# Patient Record
Sex: Female | Born: 1960 | Race: Black or African American | Hispanic: No | Marital: Single | Smoking: Current every day smoker
Health system: Southern US, Community
[De-identification: ages and names within clinical notes are randomized; demographics above are authoritative.]

## PROBLEM LIST (undated history)

## (undated) DIAGNOSIS — E785 Hyperlipidemia, unspecified: Secondary | ICD-10-CM

## (undated) DIAGNOSIS — M549 Dorsalgia, unspecified: Secondary | ICD-10-CM

## (undated) DIAGNOSIS — R87629 Unspecified abnormal cytological findings in specimens from vagina: Secondary | ICD-10-CM

## (undated) DIAGNOSIS — E119 Type 2 diabetes mellitus without complications: Secondary | ICD-10-CM

## (undated) DIAGNOSIS — K8020 Calculus of gallbladder without cholecystitis without obstruction: Secondary | ICD-10-CM

## (undated) DIAGNOSIS — R51 Headache: Secondary | ICD-10-CM

## (undated) DIAGNOSIS — T7840XA Allergy, unspecified, initial encounter: Secondary | ICD-10-CM

## (undated) HISTORY — DX: Type 2 diabetes mellitus without complications: E11.9

## (undated) HISTORY — DX: Hyperlipidemia, unspecified: E78.5

## (undated) HISTORY — PX: NO PAST SURGERIES: SHX2092

## (undated) HISTORY — DX: Unspecified abnormal cytological findings in specimens from vagina: R87.629

---

## 1998-02-26 ENCOUNTER — Other Ambulatory Visit: Admission: RE | Admit: 1998-02-26 | Discharge: 1998-02-26 | Payer: Self-pay | Admitting: Obstetrics and Gynecology

## 1999-05-13 ENCOUNTER — Other Ambulatory Visit: Admission: RE | Admit: 1999-05-13 | Discharge: 1999-05-13 | Payer: Self-pay | Admitting: Obstetrics and Gynecology

## 2000-11-13 ENCOUNTER — Other Ambulatory Visit: Admission: RE | Admit: 2000-11-13 | Discharge: 2000-11-13 | Payer: Self-pay | Admitting: Obstetrics and Gynecology

## 2001-12-29 ENCOUNTER — Emergency Department (HOSPITAL_COMMUNITY): Admission: EM | Admit: 2001-12-29 | Discharge: 2001-12-29 | Payer: Self-pay | Admitting: Emergency Medicine

## 2002-05-28 ENCOUNTER — Emergency Department (HOSPITAL_COMMUNITY): Admission: EM | Admit: 2002-05-28 | Discharge: 2002-05-28 | Payer: Self-pay | Admitting: Emergency Medicine

## 2008-10-25 ENCOUNTER — Emergency Department (HOSPITAL_COMMUNITY): Admission: EM | Admit: 2008-10-25 | Discharge: 2008-10-26 | Payer: Self-pay | Admitting: Emergency Medicine

## 2009-10-23 ENCOUNTER — Emergency Department (HOSPITAL_COMMUNITY): Admission: EM | Admit: 2009-10-23 | Discharge: 2009-10-23 | Payer: Self-pay | Admitting: Emergency Medicine

## 2009-12-21 ENCOUNTER — Emergency Department (HOSPITAL_COMMUNITY): Admission: EM | Admit: 2009-12-21 | Discharge: 2009-12-21 | Payer: Self-pay | Admitting: Emergency Medicine

## 2010-06-09 ENCOUNTER — Emergency Department (HOSPITAL_COMMUNITY): Admission: EM | Admit: 2010-06-09 | Discharge: 2010-06-09 | Payer: Self-pay | Admitting: Emergency Medicine

## 2011-01-10 LAB — URINALYSIS, ROUTINE W REFLEX MICROSCOPIC
Glucose, UA: NEGATIVE mg/dL
Nitrite: NEGATIVE
Specific Gravity, Urine: 1.03 (ref 1.005–1.030)
pH: 5.5 (ref 5.0–8.0)

## 2011-01-10 LAB — URINE MICROSCOPIC-ADD ON

## 2011-01-10 LAB — POCT PREGNANCY, URINE: Preg Test, Ur: NEGATIVE

## 2011-01-10 LAB — WET PREP, GENITAL: Clue Cells Wet Prep HPF POC: NONE SEEN

## 2011-01-27 LAB — URINE CULTURE: Colony Count: 8000

## 2011-01-27 LAB — GC/CHLAMYDIA PROBE AMP, GENITAL: Chlamydia, DNA Probe: NEGATIVE

## 2011-01-27 LAB — POCT URINALYSIS DIP (DEVICE)
Glucose, UA: NEGATIVE mg/dL
Ketones, ur: NEGATIVE mg/dL
Protein, ur: NEGATIVE mg/dL
Specific Gravity, Urine: 1.025 (ref 1.005–1.030)
Urobilinogen, UA: 0.2 mg/dL (ref 0.0–1.0)

## 2011-01-27 LAB — WET PREP, GENITAL: Yeast Wet Prep HPF POC: NONE SEEN

## 2011-07-31 LAB — WET PREP, GENITAL: Yeast Wet Prep HPF POC: NONE SEEN

## 2011-07-31 LAB — URINE MICROSCOPIC-ADD ON

## 2011-07-31 LAB — URINALYSIS, ROUTINE W REFLEX MICROSCOPIC
Glucose, UA: NEGATIVE mg/dL
Ketones, ur: NEGATIVE mg/dL
Nitrite: NEGATIVE
Specific Gravity, Urine: 1.015 (ref 1.005–1.030)
pH: 6 (ref 5.0–8.0)

## 2013-02-11 ENCOUNTER — Observation Stay (HOSPITAL_COMMUNITY): Payer: Self-pay

## 2013-02-11 ENCOUNTER — Encounter (HOSPITAL_COMMUNITY): Payer: Self-pay

## 2013-02-11 ENCOUNTER — Inpatient Hospital Stay (HOSPITAL_COMMUNITY)
Admission: EM | Admit: 2013-02-11 | Discharge: 2013-02-13 | DRG: 690 | Disposition: A | Discharge status: Home / Self Care | Payer: MEDICAID | Attending: Internal Medicine | Admitting: Internal Medicine

## 2013-02-11 DIAGNOSIS — R509 Fever, unspecified: Secondary | ICD-10-CM | POA: Diagnosis present

## 2013-02-11 DIAGNOSIS — R51 Headache: Secondary | ICD-10-CM

## 2013-02-11 DIAGNOSIS — N12 Tubulo-interstitial nephritis, not specified as acute or chronic: Principal | ICD-10-CM

## 2013-02-11 DIAGNOSIS — R519 Headache, unspecified: Secondary | ICD-10-CM | POA: Diagnosis present

## 2013-02-11 DIAGNOSIS — E876 Hypokalemia: Secondary | ICD-10-CM

## 2013-02-11 DIAGNOSIS — D72829 Elevated white blood cell count, unspecified: Secondary | ICD-10-CM | POA: Diagnosis present

## 2013-02-11 DIAGNOSIS — T7840XA Allergy, unspecified, initial encounter: Secondary | ICD-10-CM | POA: Insufficient documentation

## 2013-02-11 HISTORY — DX: Allergy, unspecified, initial encounter: T78.40XA

## 2013-02-11 HISTORY — DX: Headache: R51

## 2013-02-11 LAB — URINALYSIS, ROUTINE W REFLEX MICROSCOPIC
Bilirubin Urine: NEGATIVE
Glucose, UA: NEGATIVE mg/dL
Protein, ur: 100 mg/dL — AB
Specific Gravity, Urine: 1.025 (ref 1.005–1.030)
Urobilinogen, UA: 1 mg/dL (ref 0.0–1.0)
pH: 5.5 (ref 5.0–8.0)

## 2013-02-11 LAB — CREATININE, SERUM: GFR calc Af Amer: >90 mL/min (ref >90)

## 2013-02-11 LAB — URINE MICROSCOPIC-ADD ON

## 2013-02-11 LAB — COMPREHENSIVE METABOLIC PANEL
Albumin: 3.6 g/dL (ref 3.5–5.2)
Alkaline Phosphatase: 66 U/L (ref 39–117)
BUN: 8 mg/dL (ref 6–23)
Calcium: 9.6 mg/dL (ref 8.4–10.5)
Creatinine, Ser: 0.68 mg/dL (ref 0.50–1.10)
GFR calc Af Amer: >90 mL/min (ref >90)
Glucose, Bld: 93 mg/dL (ref 70–99)
Potassium: 3.8 mEq/L (ref 3.5–5.1)
Total Protein: 7.9 g/dL (ref 6.0–8.3)

## 2013-02-11 LAB — CBC WITH DIFFERENTIAL/PLATELET
Eosinophils Absolute: 0 10*3/uL (ref 0.0–0.7)
Eosinophils Relative: 0 % (ref 0–5)
Lymphs Abs: 2.4 10*3/uL (ref 0.7–4.0)
MCH: 29.9 pg (ref 26.0–34.0)
MCV: 85.8 fL (ref 78.0–100.0)
Monocytes Absolute: 1.2 10*3/uL — ABNORMAL HIGH (ref 0.1–1.0)
Platelets: 282 10*3/uL (ref 150–400)
RBC: 4.15 MIL/uL (ref 3.87–5.11)

## 2013-02-11 LAB — CBC
HCT: 32 % — ABNORMAL LOW (ref 36.0–46.0)
MCH: 29.5 pg (ref 26.0–34.0)
MCV: 86.5 fL (ref 78.0–100.0)
RBC: 3.7 MIL/uL — ABNORMAL LOW (ref 3.87–5.11)
WBC: 11.6 10*3/uL — ABNORMAL HIGH (ref 4.0–10.5)

## 2013-02-11 LAB — WET PREP, GENITAL
Clue Cells Wet Prep HPF POC: NONE SEEN
Trich, Wet Prep: NONE SEEN
Yeast Wet Prep HPF POC: NONE SEEN

## 2013-02-11 LAB — LACTIC ACID, PLASMA: Lactic Acid, Venous: 0.8 mmol/L (ref 0.5–2.2)

## 2013-02-11 MED ORDER — AZITHROMYCIN 250 MG PO TABS
1000.0000 mg | ORAL_TABLET | Freq: Once | ORAL | Status: AC
  Administered 2013-02-11: 1000 mg via ORAL
  Filled 2013-02-11: qty 4

## 2013-02-11 MED ORDER — SODIUM CHLORIDE 0.9 % IV SOLN
INTRAVENOUS | Status: AC
  Administered 2013-02-11 (×2): via INTRAVENOUS

## 2013-02-11 MED ORDER — DEXTROSE 5 % IV SOLN
1.0000 g | Freq: Once | INTRAVENOUS | Status: AC
  Administered 2013-02-11: 1 g via INTRAVENOUS
  Filled 2013-02-11: qty 10

## 2013-02-11 MED ORDER — DEXTROSE 5 % IV SOLN
1.0000 g | INTRAVENOUS | Status: DC
  Administered 2013-02-12: 1 g via INTRAVENOUS
  Filled 2013-02-11 (×2): qty 10

## 2013-02-11 MED ORDER — ACETAMINOPHEN 325 MG PO TABS
650.0000 mg | ORAL_TABLET | Freq: Four times a day (QID) | ORAL | Status: DC | PRN
  Administered 2013-02-11 – 2013-02-12 (×4): 650 mg via ORAL
  Filled 2013-02-11 (×4): qty 2

## 2013-02-11 MED ORDER — ONDANSETRON HCL 4 MG/2ML IJ SOLN
4.0000 mg | Freq: Three times a day (TID) | INTRAMUSCULAR | Status: AC | PRN
  Administered 2013-02-11: 4 mg via INTRAVENOUS
  Filled 2013-02-11: qty 2

## 2013-02-11 MED ORDER — HEPARIN SODIUM (PORCINE) 5000 UNIT/ML IJ SOLN
5000.0000 [IU] | Freq: Three times a day (TID) | INTRAMUSCULAR | Status: DC
  Administered 2013-02-11 – 2013-02-12 (×2): 5000 [IU] via SUBCUTANEOUS
  Filled 2013-02-11 (×5): qty 1

## 2013-02-11 MED ORDER — SODIUM CHLORIDE 0.9 % IV BOLUS (SEPSIS)
1000.0000 mL | Freq: Once | INTRAVENOUS | Status: AC
  Administered 2013-02-11: 1000 mL via INTRAVENOUS

## 2013-02-11 MED ORDER — ONDANSETRON HCL 4 MG/2ML IJ SOLN
4.0000 mg | Freq: Once | INTRAMUSCULAR | Status: AC
  Administered 2013-02-11: 4 mg via INTRAVENOUS
  Filled 2013-02-11: qty 2

## 2013-02-11 MED ORDER — MORPHINE SULFATE 4 MG/ML IJ SOLN
4.0000 mg | Freq: Once | INTRAMUSCULAR | Status: AC
  Administered 2013-02-11: 4 mg via INTRAVENOUS
  Filled 2013-02-11: qty 1

## 2013-02-11 MED ORDER — HYDROMORPHONE HCL PF 1 MG/ML IJ SOLN
1.0000 mg | INTRAMUSCULAR | Status: AC | PRN
  Administered 2013-02-11: 1 mg via INTRAVENOUS
  Filled 2013-02-11: qty 1

## 2013-02-11 MED ORDER — DEXTROSE 5 % IV SOLN
1.0000 g | INTRAVENOUS | Status: DC
  Filled 2013-02-11: qty 10

## 2013-02-11 NOTE — Progress Notes (Signed)
Notified Callahan NP that patient's temp is 102.6 HR 99.  Patient has order to notify MD for possible sepsis if temp is greater than 100.9, and HR greater than 90.  Claiborne Billings NP stated to give tynenol as ordered, no other orders given. Will continue to monitor patient. Nelda Marseille, RN

## 2013-02-11 NOTE — ED Provider Notes (Signed)
History     CSN: [id number]  Arrival date & time 02/11/13  1243   First MD Initiated Contact with Patient 02/11/13 1302      Chief Complaint  Patient presents with  . Urinary Urgency  . Fever    (Consider location/radiation/quality/duration/timing/severity/associated sxs/prior treatment) HPI  52 year old female presents complaining of fever and dysuria. Patient reports a week ago she developed some vaginal itching sensation. Sensation was ongoing for several days not improve from using over-the-counter yeast treatment medication. For the past several days she also reports having burning urination and increased urinary frequency. She also reports feeling myalgias, have a Reiter's, having decreased appetite. She dorsiflexes fever. She reports having low abdominal pain that radiates to her low back and mid back, achy, persistent. She also reports having a persistent throbbing headache to her forehead. She denies cough, chest pain, shortness of breath, nausea, vomiting, diarrhea. She denies hematuria, hematochezia, or melena. Her last sexual activities was 10 days ago.  History reviewed. No pertinent past medical history.  History reviewed. No pertinent past surgical history.  No family history on file.  History  Substance Use Topics  . Smoking status: Current Every Day Smoker -- 1.00 packs/day  . Smokeless tobacco: Not on file  . Alcohol Use: No    OB History   Grav Para Term Preterm Abortions TAB SAB Ect Mult Living                  Review of Systems  Constitutional:       10 Systems reviewed and all are negative for acute change except as noted in the HPI.     Allergies  Review of patient's allergies indicates no known allergies.  Home Medications  No current outpatient prescriptions on file.  BP 120/73  Pulse 111  Temp(Src) 103.1 F (39.5 C) (Oral)  Resp 18  SpO2 96%  Physical Exam  Nursing note and vitals reviewed. Constitutional: She appears  well-developed and well-nourished. No distress.  Awake, alert, nontoxic appearance  HENT:  Head: Atraumatic.  Mouth/Throat: Oropharynx is clear and moist.  Eyes: Conjunctivae are normal. Right eye exhibits no discharge. Left eye exhibits no discharge.  Neck: Neck supple.  Cardiovascular: Regular rhythm.   Tachycardia noted without murmurs, rubs, or gallops  Pulmonary/Chest: Effort normal. No respiratory distress. She exhibits no tenderness.  Abdominal: Soft. There is tenderness (mild suprapubic tenderness on the patient without guarding or rebound tenderness. No hernia noted.). There is no rebound. Hernia confirmed negative in the right inguinal area and confirmed negative in the left inguinal area.  Genitourinary: Uterus normal. There is no rash, tenderness, lesion or injury on the right labia. There is no rash, tenderness, lesion or injury on the left labia. Cervix exhibits discharge. Cervix exhibits no motion tenderness and no friability. Right adnexum displays no tenderness and no fullness. Left adnexum displays no tenderness and no fullness. No tenderness or bleeding around the vagina. Vaginal discharge found.  No CVA tenderness.  Chaperone present  Moderate kurd-like discharge in vaginal vault from cervical os.    Musculoskeletal: She exhibits no tenderness.  ROM appears intact, no obvious focal weakness  Neurological:  Mental status and motor strength appears intact  Skin: No rash noted.  Psychiatric: She has a normal mood and affect.    ED Course  Procedures (including critical care time)  1:23 PM Patient presents with a fever of 103.1, tachycardic but not hypotensive. She does not appear toxic. She has dysuria. The symptom is suggestive of  pyelonephritis. Workup initiated.  2:42 PM UA result is indicative of UTI.  In the setting of fever, tachycardia, and leukocytosis will treat pt for urosepsis.  Pt also has vaginal discharge on pelvic exam.  Will give rocephin and  zithromax.  Urine culture, blood culture, and GC/Ch culture sent.  IVF given, tylenol for fever.  Will admit for further care.  Care discussed with attending.  Pt likely benefit from IV abx and transition to PO once tolerated.    3:09 PM I have consulted with [facility] hospitalist, Dr. Mahala Menghini who agrees to admit pt to med surg, team 6, under his care.     Labs Reviewed  WET PREP, GENITAL - Abnormal; Notable for the following:    WBC, Wet Prep HPF POC TOO NUMEROUS TO COUNT (*)    All other components within normal limits  COMPREHENSIVE METABOLIC PANEL - Abnormal; Notable for the following:    Total Bilirubin 0.2 (*)    All other components within normal limits  URINALYSIS, ROUTINE W REFLEX MICROSCOPIC - Abnormal; Notable for the following:    APPearance CLOUDY (*)    Hgb urine dipstick LARGE (*)    Ketones, ur 15 (*)    Protein, ur 100 (*)    Leukocytes, UA MODERATE (*)    All other components within normal limits  CBC WITH DIFFERENTIAL - Abnormal; Notable for the following:    WBC 12.7 (*)    HCT 35.6 (*)    Neutro Abs 9.0 (*)    Monocytes Absolute 1.2 (*)    All other components within normal limits  URINE MICROSCOPIC-ADD ON - Abnormal; Notable for the following:    Squamous Epithelial / LPF FEW (*)    Bacteria, UA FEW (*)    All other components within normal limits  URINE CULTURE  GC/CHLAMYDIA PROBE AMP  CULTURE, BLOOD (ROUTINE X 2)  CULTURE, BLOOD (ROUTINE X 2)  LACTIC ACID, PLASMA   No results found.   1. Pyelonephritis       MDM  BP 100/65  Pulse 99  Temp(Src) 101.1 F (38.4 C) (Oral)  Resp 17  SpO2 98%  I have reviewed nursing notes and vital signs. I personally reviewed the imaging tests through PACS system  I reviewed available ER/hospitalization records thought the EMR         Fayrene Helper, [place] 02/11/13 1511

## 2013-02-11 NOTE — ED Notes (Signed)
Pt taken to xray 

## 2013-02-11 NOTE — ED Provider Notes (Signed)
Medical screening examination/treatment/procedure(s) were performed by non-physician practitioner and as supervising physician I was immediately available for consultation/collaboration.   Nelia Shi, MD 02/11/13 [id number]

## 2013-02-11 NOTE — Progress Notes (Signed)
[facility] History and Physical  Safiyah Cisney WUJ:[id number] DOB: 1961/06/12 DOA: 02/11/2013  Referring physician: Daine Gravel PA PCP: Default, Provider, MD  Specialists:  none  Chief Complaint: Abd apin  HPI: Mallory Wall is a 52 y.o. female  Who presente dot [facility] ED 4.18 after about 6 days h/o chills, mylagias.  Stayed out of work for 1-2 days.  But then developed chills again.  LAst night went to work at [employer]-decided to come in after feeling more myalgias and aches.  States she has some a mild cough-no sputum.  Her sister has been sick with bronchitis/flu like symptoms and she has had some sick contacts at her work-place as well.  She states she devleoped some mild dysuria priro to feeling ill.  She is sexually active but has not noticed any vaginal discharge or odour.  She has not had diarrhea or bloody stools  Review of Systems: The patient denies cp/sob/visual issues, falls weakness.  Endorses decreased appetite.  No tarry or dark stools, stroke like symptoms.  History reviewed. No pertinent past medical history. History reviewed. No pertinent past surgical history. Social History:  reports that she has been smoking.  She does not have any smokeless tobacco history on file. She reports that she does not drink alcohol. Her drug history is not on file.  No Known Allergies  Family History  Problem Relation Age of Onset  . CAD Mother     Prior to Admission medications   Medication Sig Start Date End Date Taking? Authorizing Provider  Aspirin-Acetaminophen-Caffeine (GOODY HEADACHE PO) Take 1 packet by mouth.   Yes Historical Provider, MD  diphenhydramine-acetaminophen (TYLENOL PM) 25-500 MG TABS Take 2 tablets by mouth at bedtime as needed (for sleep).   Yes Historical Provider, MD  Naproxen Sodium (ALEVE PO) Take 1 tablet by mouth daily as needed (for pain.).   Yes Historical Provider, MD  Pseudoeph-Doxylamine-DM-APAP (NYQUIL PO) Take 30 mLs by mouth daily  as needed (for cold symptoms).   Yes Historical Provider, MD   Physical Exam: Filed Vitals:   02/11/13 1248 02/11/13 1432  BP: 120/73 100/65  Pulse: 111 99  Temp: 103.1 F (39.5 C) 101.1 F (38.4 C)  TempSrc: Oral Oral  Resp: 18 17  SpO2: 96% 98%     General:  Alert pleasant oriented  Eyes: Movements intact nontraumaticn  ENT: Moderate dentition   Neck: Soft supple no thyromegaly   Cardiovascular: S1-S2 no rub or gallop slight tachycardiac   Respiratory: Clear   Abdomen: Soft nontender nondistended however some CVA tenderness notably on the left side   Skin: No lower extremity edema  Musculoskeletal: Range motion intact   Psychiatric: Euthymic   Neurologic: Grossly intact  Labs on Admission:  Basic Metabolic Panel:  Recent Labs Lab 02/11/13 1345  NA 136  K 3.8  CL 100  CO2 28  GLUCOSE 93  BUN 8  CREATININE 0.68  CALCIUM 9.6   Liver Function Tests:  Recent Labs Lab 02/11/13 1345  AST 15  ALT 9  ALKPHOS 66  BILITOT 0.2*  PROT 7.9  ALBUMIN 3.6   No results found for this basename: LIPASE, AMYLASE,  in the last 168 hours No results found for this basename: AMMONIA,  in the last 168 hours CBC:  Recent Labs Lab 02/11/13 1345  WBC 12.7*  NEUTROABS 9.0*  HGB 12.4  HCT 35.6*  MCV 85.8  PLT 282   Cardiac Enzymes: No results found for this basename: CKTOTAL, CKMB, CKMBINDEX, TROPONINI,  in the last 168 hours  BNP (last 3 results) No results found for this basename: PROBNP,  in the last 8760 hours CBG: No results found for this basename: GLUCAP,  in the last 168 hours  Radiological Exams on Admission: No results found.  EKG: Independently reviewed. None performed  Assessment/Plan Principal Problem:   Pyelonephritis   1. Likely pyelonephritis-patient tachycardic not hypertensive borderline low blood pressures and white count 12. Will continue ceftriaxone 1 g every 24 hourly until urine cultures result. Given she is also been exposed  to multiple sick contacts, will get her a 2 view chest xray.  Rpt labs in am 2. Vaginosis?multiple WBC-follow GC done in ED-high threshold for any STI    50 min  Mahala Menghini [facility] Pager [phone]  If 7PM-7AM, please contact night-coverage [email or web address] Password [facility] 02/11/2013, 3:14 PM

## 2013-02-11 NOTE — ED Notes (Signed)
MD at bedside; phlebotomy at bedside drawing cultures

## 2013-02-11 NOTE — ED Notes (Addendum)
Pt presents with body aches and chills that began last Saturday. Pt also has burning with urination that began last Thursday. Pt has no blood in urine. Pt reports feeling like she has had a fever for the past few days but has not checked it. Pt also has pain in her lower left flank area that radiates to her back.

## 2013-02-11 NOTE — ED Notes (Signed)
Pt being transported to floor after being given pain medication and waiting for reaction; Ginger, RN aware of situation; pt being transported to floor via Warden Fillers, EMT

## 2013-02-11 NOTE — Progress Notes (Signed)
Mallory Wall [id number] Admission Data: 02/11/2013 6:47 PM Attending Provider: Rhetta Mura, MD  WUJ:[id number], Provider, MD Consults/ Treatment Team:    Mallory Wall is a 52 y.o. female patient admitted from ED awake, alert  & orientated  X 3,  Full Code, VSS - Blood pressure 96/64, pulse 92, temperature 99.4 F (37.4 C), temperature source Oral, resp. rate 18, height 5\' 3"  (1.6 m), weight 96.2 kg (212 lb 1.3 oz), SpO2 96.00%., no c/o shortness of breath, no c/o chest pain, no distress noted.   IV site WDL:  antecubital right, condition patent and no redness with a transparent dsg that's clean dry and intact.  Allergies:  No Known Allergies   Past Medical History  Diagnosis Date  . Headache   . Allergy     History:  obtained from the patient. Tobacco/alcohol: Smoked 1 packs per day for 20 years none  Pt orientation to unit, room and routine. Information packet given to patient/family and safety video watched.  Admission INP armband ID verified with patient/family, and in place. SR up x 2, fall risk assessment complete with Patient and family verbalizing understanding of risks associated with falls. Pt verbalizes an understanding of how to use the call bell and to call for help before getting out of bed.  Skin, clean-dry- intact without evidence of bruising, or skin tears.   No evidence of skin break down noted on exam. no rashes, no ecchymoses, no petechiae    Will cont to monitor and assist as needed.  Mallory Wall Mallory Lose, RN 02/11/2013 6:47 PM

## 2013-02-12 DIAGNOSIS — E876 Hypokalemia: Secondary | ICD-10-CM | POA: Diagnosis present

## 2013-02-12 DIAGNOSIS — R509 Fever, unspecified: Secondary | ICD-10-CM

## 2013-02-12 DIAGNOSIS — R51 Headache: Secondary | ICD-10-CM

## 2013-02-12 LAB — COMPREHENSIVE METABOLIC PANEL
AST: 13 U/L (ref 0–37)
Albumin: 2.9 g/dL — ABNORMAL LOW (ref 3.5–5.2)
BUN: 7 mg/dL (ref 6–23)
Creatinine, Ser: 0.6 mg/dL (ref 0.50–1.10)
Potassium: 3.2 mEq/L — ABNORMAL LOW (ref 3.5–5.1)
Total Protein: 6.5 g/dL (ref 6.0–8.3)

## 2013-02-12 LAB — CBC
HCT: 31.4 % — ABNORMAL LOW (ref 36.0–46.0)
MCHC: 34.1 g/dL (ref 30.0–36.0)
MCV: 86.7 fL (ref 78.0–100.0)
Platelets: 246 10*3/uL (ref 150–400)
RDW: 15.3 % (ref 11.5–15.5)
WBC: 10.6 10*3/uL — ABNORMAL HIGH (ref 4.0–10.5)

## 2013-02-12 LAB — URINE CULTURE

## 2013-02-12 LAB — GC/CHLAMYDIA PROBE AMP: GC Probe RNA: NEGATIVE

## 2013-02-12 MED ORDER — SALINE SPRAY 0.65 % NA SOLN
1.0000 | NASAL | Status: DC | PRN
  Filled 2013-02-12: qty 44

## 2013-02-12 MED ORDER — POTASSIUM CHLORIDE CRYS ER 20 MEQ PO TBCR
40.0000 meq | EXTENDED_RELEASE_TABLET | Freq: Four times a day (QID) | ORAL | Status: AC
  Administered 2013-02-12 (×2): 40 meq via ORAL
  Filled 2013-02-12 (×2): qty 2

## 2013-02-12 MED ORDER — ACETAMINOPHEN 325 MG PO TABS
650.0000 mg | ORAL_TABLET | Freq: Four times a day (QID) | ORAL | Status: DC | PRN
  Administered 2013-02-13: 650 mg via ORAL
  Filled 2013-02-12: qty 2

## 2013-02-12 MED ORDER — MORPHINE SULFATE 2 MG/ML IJ SOLN
1.0000 mg | INTRAMUSCULAR | Status: DC | PRN
  Administered 2013-02-12: 2 mg via INTRAVENOUS
  Filled 2013-02-12: qty 1

## 2013-02-12 MED ORDER — SODIUM CHLORIDE 0.9 % IV SOLN
INTRAVENOUS | Status: DC
  Administered 2013-02-12 – 2013-02-13 (×3): via INTRAVENOUS

## 2013-02-12 NOTE — Progress Notes (Signed)
[facility] PROGRESS NOTE  Mallory Wall ZOX:[id number] DOB: 07/15/1961 DOA: 02/11/2013 PCP: Default, Provider, MD  HPI/Subjective: Had fever earlier today, currently comfortable eating her breakfast  Assessment/Plan:  Pyelonephritis/UTI -Started on Rocephin empirically. -Adjust IV antibiotics according to the culture results.  Hypotension -Transient likely secondary to the UTI, responded to IV fluids.  Hypokalemia -Replete orally  Code Status: Full code Family Communication:  Disposition Plan: Reason patient   Consultants:  None  Procedures:  None  Antibiotics:  Rocephin    Objective: Filed Vitals:   02/11/13 2228 02/12/13 0416 02/12/13 0516 02/12/13 0931  BP:  111/73  132/54  Pulse:  91    Temp: 100.9 F (38.3 C) 101.6 F (38.7 C) 100 F (37.8 C)   TempSrc:  Oral    Resp:  20    Height:      Weight:      SpO2:  93%      Intake/Output Summary (Last 24 hours) at 02/12/13 1032 Last data filed at 02/12/13 0900  Gross per 24 hour  Intake 1376.67 ml  Output      0 ml  Net 1376.67 ml   Filed Weights   02/11/13 1807  Weight: 96.2 kg (212 lb 1.3 oz)    Exam:  General: Alert and awake, oriented x3, not in any acute distress. HEENT: anicteric sclera, pupils reactive to light and accommodation, EOMI CVS: S1-S2 clear, no murmur rubs or gallops Chest: clear to auscultation bilaterally, no wheezing, rales or rhonchi Abdomen: soft nontender, nondistended, normal bowel sounds, no organomegaly Extremities: no cyanosis, clubbing or edema noted bilaterally Neuro: Cranial nerves II-XII intact, no focal neurological deficits  Data Reviewed: Basic Metabolic Panel:  Recent Labs Lab 02/11/13 1345 02/11/13 1816 02/12/13 0600  NA 136  --  138  K 3.8  --  3.2*  CL 100  --  102  CO2 28  --  26  GLUCOSE 93  --  86  BUN 8  --  7  CREATININE 0.68 0.66 0.60  CALCIUM 9.6  --  8.1*   Liver Function Tests:  Recent Labs Lab 02/11/13 1345  02/12/13 0600  AST 15 13  ALT 9 8  ALKPHOS 66 53  BILITOT 0.2* 0.2*  PROT 7.9 6.5  ALBUMIN 3.6 2.9*   No results found for this basename: LIPASE, AMYLASE,  in the last 168 hours No results found for this basename: AMMONIA,  in the last 168 hours CBC:  Recent Labs Lab 02/11/13 1345 02/11/13 1816 02/12/13 0600  WBC 12.7* 11.6* 10.6*  NEUTROABS 9.0*  --   --   HGB 12.4 10.9* 10.7*  HCT 35.6* 32.0* 31.4*  MCV 85.8 86.5 86.7  PLT 282 245 246   Cardiac Enzymes: No results found for this basename: CKTOTAL, CKMB, CKMBINDEX, TROPONINI,  in the last 168 hours BNP (last 3 results) No results found for this basename: PROBNP,  in the last 8760 hours CBG: No results found for this basename: GLUCAP,  in the last 168 hours  Recent Results (from the past 240 hour(s))  WET PREP, GENITAL     Status: Abnormal   Collection Time    02/11/13  2:11 PM      Result Value Range Status   Yeast Wet Prep HPF POC NONE SEEN  NONE SEEN Final   Trich, Wet Prep NONE SEEN  NONE SEEN Final   Clue Cells Wet Prep HPF POC NONE SEEN  NONE SEEN Final   WBC, Wet Prep HPF POC TOO NUMEROUS  TO COUNT (*) NONE SEEN Final  GC/CHLAMYDIA PROBE AMP     Status: None   Collection Time    02/11/13  2:12 PM      Result Value Range Status   CT Probe RNA NEGATIVE  NEGATIVE Final   GC Probe RNA NEGATIVE  NEGATIVE Final   Comment: (NOTE)                                                                                              *Normal Reference Range: Negative          Assay performed using the Gen-Probe APTIMA COMBO2 (R) Assay.     Acceptable specimen types for this assay include APTIMA Swabs (Unisex,     endocervical, urethral, or vaginal), first void urine, and ThinPrep     liquid based cytology samples.     Studies: Dg Chest 2 View  02/11/2013  *RADIOLOGY REPORT*  Clinical Data: Fever, chills, body aches  CHEST - 2 VIEW  Comparison: None.  Findings: Lungs are clear. No pleural effusion or pneumothorax.   Cardiomediastinal silhouette is within normal limits.  Mild degenerative changes of the visualized thoracolumbar spine.  IMPRESSION: No evidence of acute cardiopulmonary disease.   Original Report Authenticated By: Charline Bills, M.D.     Scheduled Meds: . cefTRIAXone (ROCEPHIN)  IV  1 g Intravenous Q24H   Continuous Infusions:   Principal Problem:   Pyelonephritis    Time spent: 35 minutes    [facility] A  [facility] Pager [phone]. If 7PM-7AM, please contact night-coverage at [email or web address], password [facility] 02/12/2013, 10:32 AM  LOS: 1 day

## 2013-02-12 NOTE — Progress Notes (Signed)
Utilization Review Completed.   Brayson Livesey, RN, BSN Nurse Case Manager  [phone]  

## 2013-02-13 DIAGNOSIS — E876 Hypokalemia: Secondary | ICD-10-CM

## 2013-02-13 LAB — CBC
HCT: 31.9 % — ABNORMAL LOW (ref 36.0–46.0)
MCHC: 34.2 g/dL (ref 30.0–36.0)
RDW: 14.6 % (ref 11.5–15.5)

## 2013-02-13 LAB — BASIC METABOLIC PANEL
BUN: 5 mg/dL — ABNORMAL LOW (ref 6–23)
Calcium: 8.8 mg/dL (ref 8.4–10.5)
Chloride: 103 mEq/L (ref 96–112)
Creatinine, Ser: 0.54 mg/dL (ref 0.50–1.10)
GFR calc Af Amer: >90 mL/min (ref >90)
GFR calc non Af Amer: >90 mL/min (ref >90)

## 2013-02-13 MED ORDER — CIPROFLOXACIN HCL 500 MG PO TABS: 500.0000 mg | ORAL_TABLET | Freq: Two times a day (BID) | ORAL | Status: DC

## 2013-02-13 NOTE — Discharge Summary (Signed)
Physician Discharge Summary  Mallory Wall ZOX:[id number] DOB: August 13, 1961 DOA: 02/11/2013  PCP: Default, Provider, MD  Admit date: 02/11/2013 Discharge date: 02/13/2013  Time spent: 40 minutes  Recommendations for Outpatient Follow-up:  1. Followup with primary care physician within one week  Discharge Diagnoses:  Principal Problem:   Pyelonephritis Active Problems:   Headache   Hypokalemia   Fever, unspecified   Discharge Condition: Stable  Diet recommendation: Regular  Filed Weights   02/11/13 1807  Weight: 96.2 kg (212 lb 1.3 oz)    History of present illness:  Mallory Wall is a 52 y.o. female Who presente dot [facility] ED 4.18 after about 6 days h/o chills, mylagias. Stayed out of work for 1-2 days. But then developed chills again. LAst night went to work at [employer]-decided to come in after feeling more myalgias and aches. States she has some a mild cough-no sputum. Her sister has been sick with bronchitis/flu like symptoms and she has had some sick contacts at her work-place as well.  She states she devleoped some mild dysuria priro to feeling ill. She is sexually active but has not noticed any vaginal discharge or odour. She has not had diarrhea or bloody stools.  Hospital Course:   1. UTI: The patient presents to the hospital with above-mentioned symptoms including chills, myalgia and dysuria. Urinalysis showed multiple pus cells. At the time of admission patient started empirically on Rocephin. Surprisingly the culture comes back of insignificant growth. It was felt strongly that the patient did have UTI, so ciprofloxacin was prescribed at the time of discharge.  2. Vaginal leukocytosis: Which is indicates inflammation, wet prep did not show any yeast, trichomonas or clue cells. GC and CT results are negative. Patient denies any pelvic pain to support any presence of PID. Patient was discharged on ciprofloxacin for 10 more days for her UTI. Patient advised to  followup with OB/GYN if she is having any pelvic symptoms.  3. Hypokalemia: This is repleted orally.  Procedures:  None  Consultations:  None  Discharge Exam: Filed Vitals:   02/12/13 0931 02/12/13 1341 02/12/13 2202 02/13/13 0532  BP: 132/54 104/69 109/73 98/66  Pulse:  83 84 74  Temp:  99.3 F (37.4 C) 99.9 F (37.7 C) 98.7 F (37.1 C)  TempSrc:  Oral Oral Oral  Resp:  20 18 18   Height:      Weight:      SpO2:  97% 99% 97%   General: Alert and awake, oriented x3, not in any acute distress. HEENT: anicteric sclera, pupils reactive to light and accommodation, EOMI CVS: S1-S2 clear, no murmur rubs or gallops Chest: clear to auscultation bilaterally, no wheezing, rales or rhonchi Abdomen: soft nontender, nondistended, normal bowel sounds, no organomegaly Extremities: no cyanosis, clubbing or edema noted bilaterally Neuro: Cranial nerves II-XII intact, no focal neurological deficits  Discharge Instructions     Medication List    STOP taking these medications       ALEVE PO     GOODY HEADACHE PO     NYQUIL PO      TAKE these medications       ciprofloxacin 500 MG tablet  Commonly known as:  CIPRO  Take 1 tablet (500 mg total) by mouth 2 (two) times daily.     diphenhydramine-acetaminophen 25-500 MG Tabs  Commonly known as:  TYLENOL PM  Take 2 tablets by mouth at bedtime as needed (for sleep).          The results of  significant diagnostics from this hospitalization (including imaging, microbiology, ancillary and laboratory) are listed below for reference.    Significant Diagnostic Studies: Dg Chest 2 View  02/11/2013  *RADIOLOGY REPORT*  Clinical Data: Fever, chills, body aches  CHEST - 2 VIEW  Comparison: None.  Findings: Lungs are clear. No pleural effusion or pneumothorax.  Cardiomediastinal silhouette is within normal limits.  Mild degenerative changes of the visualized thoracolumbar spine.  IMPRESSION: No evidence of acute cardiopulmonary disease.    Original Report Authenticated By: Charline Bills, M.D.     Microbiology: Recent Results (from the past 240 hour(s))  URINE CULTURE     Status: None   Collection Time    02/11/13  1:22 PM      Result Value Range Status   Specimen Description URINE, CLEAN CATCH   Final   Special Requests NONE   Final   Culture  Setup Time 02/11/2013 14:01   Final   Colony Count 8,000 COLONIES/ML   Final   Culture INSIGNIFICANT GROWTH   Final   Report Status 02/12/2013 FINAL   Final  WET PREP, GENITAL     Status: Abnormal   Collection Time    02/11/13  2:11 PM      Result Value Range Status   Yeast Wet Prep HPF POC NONE SEEN  NONE SEEN Final   Trich, Wet Prep NONE SEEN  NONE SEEN Final   Clue Cells Wet Prep HPF POC NONE SEEN  NONE SEEN Final   WBC, Wet Prep HPF POC TOO NUMEROUS TO COUNT (*) NONE SEEN Final  GC/CHLAMYDIA PROBE AMP     Status: None   Collection Time    02/11/13  2:12 PM      Result Value Range Status   CT Probe RNA NEGATIVE  NEGATIVE Final   GC Probe RNA NEGATIVE  NEGATIVE Final   Comment: (NOTE)                                                                                              **Normal Reference Range: Negative**          Assay performed using the Gen-Probe APTIMA COMBO2 (R) Assay.     Acceptable specimen types for this assay include APTIMA Swabs (Unisex,     endocervical, urethral, or vaginal), first void urine, and ThinPrep     liquid based cytology samples.  CULTURE, BLOOD (ROUTINE X 2)     Status: None   Collection Time    02/11/13  3:20 PM      Result Value Range Status   Specimen Description BLOOD ARM LEFT   Final   Special Requests BOTTLES DRAWN AEROBIC AND ANAEROBIC 10CC   Final   Culture  Setup Time 02/11/2013 22:49   Final   Culture     Final   Value:        BLOOD CULTURE RECEIVED NO GROWTH TO DATE CULTURE WILL BE HELD FOR 5 DAYS BEFORE ISSUING A FINAL NEGATIVE REPORT   Report Status PENDING   Incomplete  CULTURE, BLOOD (ROUTINE X 2)     Status: None    Collection Time  02/11/13  3:28 PM      Result Value Range Status   Specimen Description BLOOD HAND RIGHT   Final   Special Requests BOTTLES DRAWN AEROBIC AND ANAEROBIC 10CC   Final   Culture  Setup Time 02/11/2013 22:48   Final   Culture     Final   Value:        BLOOD CULTURE RECEIVED NO GROWTH TO DATE CULTURE WILL BE HELD FOR 5 DAYS BEFORE ISSUING A FINAL NEGATIVE REPORT   Report Status PENDING   Incomplete     Labs: Basic Metabolic Panel:  Recent Labs Lab 02/11/13 1345 02/11/13 1816 02/12/13 0600 02/13/13 0530  NA 136  --  138 137  K 3.8  --  3.2* 4.1  CL 100  --  102 103  CO2 28  --  26 25  GLUCOSE 93  --  86 103*  BUN 8  --  7 5*  CREATININE 0.68 0.66 0.60 0.54  CALCIUM 9.6  --  8.1* 8.8   Liver Function Tests:  Recent Labs Lab 02/11/13 1345 02/12/13 0600  AST 15 13  ALT 9 8  ALKPHOS 66 53  BILITOT 0.2* 0.2*  PROT 7.9 6.5  ALBUMIN 3.6 2.9*   No results found for this basename: LIPASE, AMYLASE,  in the last 168 hours No results found for this basename: AMMONIA,  in the last 168 hours CBC:  Recent Labs Lab 02/11/13 1345 02/11/13 1816 02/12/13 0600 02/13/13 0530  WBC 12.7* 11.6* 10.6* 6.5  NEUTROABS 9.0*  --   --   --   HGB 12.4 10.9* 10.7* 10.9*  HCT 35.6* 32.0* 31.4* 31.9*  MCV 85.8 86.5 86.7 86.0  PLT 282 245 246 245   Cardiac Enzymes: No results found for this basename: CKTOTAL, CKMB, CKMBINDEX, TROPONINI,  in the last 168 hours BNP: BNP (last 3 results) No results found for this basename: PROBNP,  in the last 8760 hours CBG: No results found for this basename: GLUCAP,  in the last 168 hours     Signed:  Clarke Peretz A  [facility] 02/13/2013, 10:31 AM

## 2013-02-13 NOTE — Progress Notes (Signed)
Patient discharge teaching given, including activity, diet, follow-up appoints, and medications. Patient verbalized understanding of all discharge instructions. IV access was d/c'd. Vitals are stable. Skin is intact except as charted in most recent assessments. Pt to be escorted out by NT, to be driven home by family. 

## 2013-02-13 NOTE — Care Management (Signed)
Received call from nursing unit requesting assistance for patient finding a PCP. Faxed a list of community resources and clinics for patient to call on Monday to the nursing unit so it can be given to the patient. Called patient to make aware of the list that will be given to her. States she will call and follow up for PCP. No further needs assessed.  ATIKAHALL,RNCM [phone]

## 2013-02-17 LAB — CULTURE, BLOOD (ROUTINE X 2)

## 2013-02-24 ENCOUNTER — Encounter (HOSPITAL_COMMUNITY): Payer: Self-pay

## 2013-02-24 ENCOUNTER — Emergency Department (HOSPITAL_COMMUNITY)
Admission: EM | Admit: 2013-02-24 | Discharge: 2013-02-24 | Disposition: A | Discharge status: Home Health | Payer: Self-pay | Source: Home / Self Care

## 2013-02-24 DIAGNOSIS — N12 Tubulo-interstitial nephritis, not specified as acute or chronic: Secondary | ICD-10-CM

## 2013-02-24 MED ORDER — DIPHENHYDRAMINE-APAP (SLEEP) 25-500 MG PO TABS: 2.0000 | ORAL_TABLET | Freq: Every evening | ORAL | Status: DC | PRN

## 2013-02-24 MED ORDER — ACETAMINOPHEN 325 MG PO TABS: 650.0000 mg | ORAL_TABLET | Freq: Four times a day (QID) | ORAL | Status: DC | PRN

## 2013-02-24 NOTE — Progress Notes (Signed)
Patient Demographics  Mallory Wall, is a 52 y.o. female  ZOX:[id number]  WJX:[id number]  DOB - 11/03/1960  Chief Complaint  Patient presents with  . Establish Care  . Pyelonephritis        Subjective:   Mallory Wall today is here for a follow up vist following recent hospitalization and to establish primary care.  She recently was admitted for pyelonephritis and has completed a course of antibiotics. She is afebrile and denies any fever, but claims she continues to have some intermittent back and right hip area pain-particularly on movement-bending to the right side Patient has No headache, No chest pain, No abdominal pain - No Nausea, No new weakness tingling or numbness, No Cough - SOB.   Objective:    Filed Vitals:   02/24/13 1208  BP: 137/83  Pulse: 93  Temp: 98.3 F (36.8 C)  TempSrc: Oral  Resp: 16  SpO2: 100%     ALLERGIES:  No Known Allergies  PAST MEDICAL HISTORY: Past Medical History  Diagnosis Date  . Headache   . Allergy     PAST SURGICAL HISTORY: History reviewed. No pertinent past surgical history.  FAMILY HISTORY: Family History  Problem Relation Age of Onset  . CAD Mother   . Hypertension Mother   . Diabetes type II Sister     MEDICATIONS AT HOME: Prior to Admission medications   Medication Sig Start Date End Date Taking? Authorizing Provider  acetaminophen (TYLENOL) 325 MG tablet Take 2 tablets (650 mg total) by mouth every 6 (six) hours as needed for pain. 02/24/13   Mallory Wall Levora Dredge, MD  diphenhydramine-acetaminophen (TYLENOL PM) 25-500 MG TABS Take 2 tablets by mouth at bedtime as needed (for sleep). 02/24/13   [name], MD    REVIEW OF SYSTEMS:  Constitutional:   No   Fevers, chills, fatigue.  HEENT:    No headaches, Sore throat,   Cardio-vascular: No chest pain,  Orthopnea, swelling in lower extremities, anasarca, palpitations  GI:  No abdominal pain, nausea, vomiting, diarrhea  Resp: No shortness of  breath,  No coughing up of blood.No cough.No wheezing.  Skin:  no rash or lesions.  GU:  no dysuria, change in color of urine, no urgency or frequency.  No flank pain.  Musculoskeletal: No joint pain or swelling.  No decreased range of motion.    Psych: No change in mood or affect. No depression or anxiety.  No memory loss.   Exam  General appearance :Awake, alert, not in any distress. Speech Clear. Not toxic Looking HEENT: Atraumatic and Normocephalic, pupils equally reactive to light and accomodation Neck: supple, no JVD. No cervical lymphadenopathy.  Chest:Good air entry bilaterally, no added sounds  CVS: S1 S2 regular, no murmurs.  Abdomen: Bowel sounds present, Non tender and not distended with no gaurding, rigidity or rebound. Extremities: B/L Lower Ext shows no edema, both legs are warm to touch Neurology: Awake alert, and oriented X 3, CN II-XII intact, Non focal Skin:No Rash Wounds:N/A    Data Review   CBC No results found for this basename: WBC, HGB, HCT, PLT, MCV, MCH, MCHC, RDW, NEUTRABS, LYMPHSABS, MONOABS, EOSABS, BASOSABS, BANDABS, BANDSABD,  in the last 168 hours  Chemistries   No results found for this basename: NA, K, CL, CO2, GLUCOSE, BUN, CREATININE, GFRCGP, CALCIUM, MG, AST, ALT, ALKPHOS, BILITOT,  in the last 168 hours ------------------------------------------------------------------------------------------------------------------ No results found for this basename: HGBA1C,  in the last 72 hours ------------------------------------------------------------------------------------------------------------------ No results found for this basename: CHOL,  HDL, LDLCALC, TRIG, CHOLHDL, LDLDIRECT,  in the last 72 hours ------------------------------------------------------------------------------------------------------------------ No results found for this basename: TSH, T4TOTAL, FREET3, T3FREE, THYROIDAB,  in the last 72  hours ------------------------------------------------------------------------------------------------------------------ No results found for this basename: VITAMINB12, FOLATE, FERRITIN, TIBC, IRON, RETICCTPCT,  in the last 72 hours  Coagulation profile  No results found for this basename: INR, PROTIME,  in the last 168 hours    Assessment & Plan   Active Problems: Recent Pyelonephritis  -still with intermittent low back pain-and right flank pain -no fever-off all antibiotics -claims pain-comes on with movement-therefore-suspected musculoskeletal etioology-check X ray of the LS Spine and hip -prn tylenol for now -check BMET, lipids and A1C prior to next visit  Gen Health Maintenance -if establishes care here and is complaint to follow up will need age appropriate cancer screening tests-however for now -will try to work up the patient's back pain    Follow-up Information   Follow up with [facility]. Schedule an appointment as soon as possible for a visit in 2 weeks.

## 2013-02-24 NOTE — ED Notes (Signed)
Patient is here to establish care Was recently discharged from hospital for pyelonephritis

## 2013-03-04 ENCOUNTER — Ambulatory Visit (HOSPITAL_COMMUNITY)
Admission: RE | Admit: 2013-03-04 | Discharge: 2013-03-04 | Disposition: A | Discharge status: Home / Self Care | Payer: Self-pay | Source: Ambulatory Visit | Attending: Internal Medicine | Admitting: Internal Medicine

## 2013-03-04 DIAGNOSIS — M5137 Other intervertebral disc degeneration, lumbosacral region: Secondary | ICD-10-CM | POA: Insufficient documentation

## 2013-03-04 DIAGNOSIS — M47817 Spondylosis without myelopathy or radiculopathy, lumbosacral region: Secondary | ICD-10-CM | POA: Insufficient documentation

## 2013-03-04 DIAGNOSIS — M25559 Pain in unspecified hip: Secondary | ICD-10-CM | POA: Insufficient documentation

## 2013-03-04 DIAGNOSIS — I700 Atherosclerosis of aorta: Secondary | ICD-10-CM | POA: Insufficient documentation

## 2013-03-04 DIAGNOSIS — M51379 Other intervertebral disc degeneration, lumbosacral region without mention of lumbar back pain or lower extremity pain: Secondary | ICD-10-CM | POA: Insufficient documentation

## 2013-03-11 ENCOUNTER — Ambulatory Visit: Payer: No Typology Code available for payment source | Attending: Family Medicine

## 2013-03-11 VITALS — Temp 97.9°F

## 2013-03-11 DIAGNOSIS — N12 Tubulo-interstitial nephritis, not specified as acute or chronic: Secondary | ICD-10-CM

## 2013-03-11 DIAGNOSIS — E1110 Type 2 diabetes mellitus with ketoacidosis without coma: Secondary | ICD-10-CM

## 2013-03-11 LAB — HEMOGLOBIN A1C
Hgb A1c MFr Bld: 6 % — ABNORMAL HIGH (ref <5.7)
Mean Plasma Glucose: 126 mg/dL — ABNORMAL HIGH (ref <117)

## 2013-03-11 LAB — LIPID PANEL
Cholesterol: 215 mg/dL — ABNORMAL HIGH (ref 0–200)
HDL: 44 mg/dL
LDL Cholesterol: 156 mg/dL — ABNORMAL HIGH (ref 0–99)
Total CHOL/HDL Ratio: 4.9 ratio
Triglycerides: 74 mg/dL
VLDL: 15 mg/dL (ref 0–40)

## 2013-03-11 LAB — BASIC METABOLIC PANEL
BUN: 6 mg/dL (ref 6–23)
Potassium: 3.8 mEq/L (ref 3.5–5.3)

## 2013-03-11 NOTE — Progress Notes (Unsigned)
Patient here for lab work only 

## 2013-03-18 ENCOUNTER — Ambulatory Visit: Payer: No Typology Code available for payment source | Attending: Family Medicine | Admitting: Family Medicine

## 2013-03-18 VITALS — BP 137/92 | HR 79 | Temp 98.0°F | Resp 16 | Ht 63.0 in | Wt 160.0 lb

## 2013-03-18 DIAGNOSIS — Z87440 Personal history of urinary (tract) infections: Secondary | ICD-10-CM

## 2013-03-18 DIAGNOSIS — Z87448 Personal history of other diseases of urinary system: Secondary | ICD-10-CM | POA: Insufficient documentation

## 2013-03-18 DIAGNOSIS — M25559 Pain in unspecified hip: Secondary | ICD-10-CM | POA: Insufficient documentation

## 2013-03-18 DIAGNOSIS — R7303 Prediabetes: Secondary | ICD-10-CM

## 2013-03-18 DIAGNOSIS — B3731 Acute candidiasis of vulva and vagina: Secondary | ICD-10-CM | POA: Insufficient documentation

## 2013-03-18 DIAGNOSIS — M5136 Other intervertebral disc degeneration, lumbar region: Secondary | ICD-10-CM

## 2013-03-18 DIAGNOSIS — B373 Candidiasis of vulva and vagina: Secondary | ICD-10-CM

## 2013-03-18 DIAGNOSIS — R7309 Other abnormal glucose: Secondary | ICD-10-CM

## 2013-03-18 DIAGNOSIS — B379 Candidiasis, unspecified: Secondary | ICD-10-CM | POA: Insufficient documentation

## 2013-03-18 DIAGNOSIS — E785 Hyperlipidemia, unspecified: Secondary | ICD-10-CM | POA: Insufficient documentation

## 2013-03-18 DIAGNOSIS — M5137 Other intervertebral disc degeneration, lumbosacral region: Secondary | ICD-10-CM

## 2013-03-18 MED ORDER — FLUCONAZOLE 150 MG PO TABS: 150.0000 mg | ORAL_TABLET | Freq: Once | ORAL | Status: DC

## 2013-03-18 NOTE — Progress Notes (Signed)
Patient ID: Mallory Wall, female   DOB: 27-Jul-1961, 52 y.o.   MRN: [id number]  [id number]   HPI: Pt reports overall improvement but still having some left hip pain and yeast infection not resolved with over the counter agents.  No Known Allergies Past Medical History  Diagnosis Date  . Headache   . Allergy    Current Outpatient Prescriptions on File Prior to Visit  Medication Sig Dispense Refill  . acetaminophen (TYLENOL) 325 MG tablet Take 2 tablets (650 mg total) by mouth every 6 (six) hours as needed for pain.      . diphenhydramine-acetaminophen (TYLENOL PM) 25-500 MG TABS Take 2 tablets by mouth at bedtime as needed (for sleep).  14 tablet     No current facility-administered medications on file prior to visit.   Family History  Problem Relation Age of Onset  . CAD Mother   . Hypertension Mother   . Diabetes type II Sister    History   Social History  . Marital Status: Married    Spouse Name: N/A    Number of Children: N/A  . Years of Education: N/A   Occupational History  . Not on file.   Social History Main Topics  . Smoking status: Current Every Day Smoker -- 1.00 packs/day  . Smokeless tobacco: Not on file  . Alcohol Use: No  . Drug Use: No  . Sexually Active: Not Currently   Other Topics Concern  . Not on file   Social History Narrative   Works at [employer]    Review of Systems  Constitutional: Negative for fever, chills, diaphoresis, activity change, appetite change and fatigue.  HENT: Negative for ear pain, nosebleeds, congestion, facial swelling, rhinorrhea, neck pain, neck stiffness and ear discharge.   Eyes: Negative for pain, discharge, redness, itching and visual disturbance.  Respiratory: Negative for cough, choking, chest tightness, shortness of breath, wheezing and stridor.   Cardiovascular: Negative for chest pain, palpitations and leg swelling.  Gastrointestinal: Negative for abdominal distention.  Genitourinary: Negative  for dysuria, urgency, frequency, hematuria, flank pain, decreased urine volume, difficulty urinating and dyspareunia.  Musculoskeletal: Negative for back pain, joint swelling, arthralgias and gait problem.  Neurological: Negative for dizziness, tremors, seizures, syncope, facial asymmetry, speech difficulty, weakness, light-headedness, numbness and headaches.  Hematological: Negative for adenopathy. Does not bruise/bleed easily.  Psychiatric/Behavioral: Negative for hallucinations, behavioral problems, confusion, dysphoric mood, decreased concentration and agitation.    Objective:   Filed Vitals:   03/18/13 0916  BP: 137/92  Pulse: 79  Temp: 98 F (36.7 C)  Resp: 16    Physical Exam  Constitutional: Appears well-developed and well-nourished. No distress.  HENT: Normocephalic. External right and left ear normal. Oropharynx is clear and moist.  Eyes: Conjunctivae and EOM are normal. PERRLA, no scleral icterus.  Neck: Normal ROM. Neck supple. No JVD. No tracheal deviation. No thyromegaly.  CVS: RRR, S1/S2 +, no murmurs, no gallops, no carotid bruit.  Pulmonary: Effort and breath sounds normal, no stridor, rhonchi, wheezes, rales.  Abdominal: Soft. BS +,  no distension, tenderness, rebound or guarding.  Musculoskeletal: Normal range of motion. No edema and no tenderness.  Lymphadenopathy: No lymphadenopathy noted, cervical, inguinal. Neuro: Alert. Normal reflexes, muscle tone coordination. No cranial nerve deficit. Skin: Skin is warm and dry. No rash noted. Not diaphoretic. No erythema. No pallor.  Psychiatric: Normal mood and affect. Behavior, judgment, thought content normal.   Lab Results  Component Value Date   WBC 6.5 02/13/2013   HGB  10.9* 02/13/2013   HCT 31.9* 02/13/2013   MCV 86.0 02/13/2013   PLT 245 02/13/2013   Lab Results  Component Value Date   CREATININE 0.62 03/11/2013   BUN 6 03/11/2013   NA 141 03/11/2013   K 3.8 03/11/2013   CL 103 03/11/2013   CO2 27 03/11/2013     Lab Results  Component Value Date   HGBA1C 6.0* 03/11/2013   Lipid Panel     Component Value Date/Time   CHOL 215* 03/11/2013 1137   TRIG 74 03/11/2013 1137   HDL 44 03/11/2013 1137   CHOLHDL 4.9 03/11/2013 1137   VLDL 15 03/11/2013 1137   LDLCALC 156* 03/11/2013 1137       Assessment and plan:   Patient Active Problem List   Diagnosis Date Noted  . DDD (degenerative disc disease), lumbar 03/18/2013  . History of pyelonephritis 03/18/2013       Prediabetes    Hyperlipidemia  Counseled pt today about diet and exercise  Check urine today.  Fluconazole 150 mg po   The patient was given clear instructions to go to ER or return to medical center if symptoms don't improve, worsen or new problems develop.  The patient verbalized understanding.  The patient was told to call to get lab results if they haven't heard anything in the next week.    Follow up  In 3 months  Rodney Langton, MD, CDE, FAAFP [facility] [place], [place]

## 2013-03-18 NOTE — Patient Instructions (Signed)
Degenerative Disk Disease Degenerative disk disease is a condition caused by the changes that occur in the cushions of the backbone (spinal disks) as you grow older. Spinal disks are soft and compressible disks located between the bones of the spine (vertebrae). They act like shock absorbers. Degenerative disk disease can affect the whole spine. However, the neck and lower back are most commonly affected. Many changes can occur in the spinal disks with aging, such as:  The spinal disks may dry and shrink.  Small tears may occur in the tough, outer covering of the disk (annulus).  The disk space may become smaller due to loss of water.  Abnormal growths in the bone (spurs) may occur. This can put pressure on the nerve roots exiting the spinal canal, causing pain.  The spinal canal may become narrowed. CAUSES  Degenerative disk disease is a condition caused by the changes that occur in the spinal disks with aging. The exact cause is not known, but there is a genetic basis for many patients. Degenerative changes can occur due to loss of fluid in the disk. This makes the disk thinner and reduces the space between the backbones. Small cracks can develop in the outer layer of the disk. This can lead to the breakdown of the disk. You are more likely to get degenerative disk disease if you are overweight. Smoking cigarettes and doing heavy work such as weightlifting can also increase your risk of this condition. Degenerative changes can start after a sudden injury. Growth of bone spurs can compress the nerve roots and cause pain.  SYMPTOMS  The symptoms vary from person to person. Some people may have no pain, while others have severe pain. The pain may be so severe that it can limit your activities. The location of the pain depends on the part of your backbone that is affected. You will have neck or arm pain if a disk in the neck area is affected. You will have pain in your back, buttocks, or legs if a disk  in the lower back is affected. The pain becomes worse while bending, reaching up, or with twisting movements. The pain may start gradually and then get worse as time passes. It may also start after a major or minor injury. You may feel numbness or tingling in the arms or legs.  DIAGNOSIS  Your caregiver will ask you about your symptoms and about activities or habits that may cause the pain. He or she may also ask about any injuries, diseases, or treatments you have had earlier. Your caregiver will examine you to check for the range of movement that is possible in the affected area, to check for strength in your extremities, and to check for sensation in the areas of the arms and legs supplied by different nerve roots. An X-ray of the spine may be taken. Your caregiver may suggest other imaging tests, such as magnetic resonance imaging (MRI), if needed.  TREATMENT  Treatment includes rest, modifying your activities, and applying ice and heat. Your caregiver may prescribe medicines to reduce your pain and may ask you to do some exercises to strengthen your back. In some cases, you may need surgery. You and your caregiver will decide on the treatment that is best for you. HOME CARE INSTRUCTIONS   Follow proper lifting and walking techniques as advised by your caregiver.  Maintain good posture.  Exercise regularly as advised.  Perform relaxation exercises.  Change your sitting, standing, and sleeping habits as advised. Change positions   frequently.  Lose weight as advised.  Stop smoking if you smoke.  Wear supportive footwear. SEEK MEDICAL CARE IF:  Your pain does not go away within 1 to 4 weeks. SEEK IMMEDIATE MEDICAL CARE IF:   Your pain is severe.  You notice weakness in your arms, hands, or legs.  You begin to lose control of your bladder or bowel movements. MAKE SURE YOU:   Understand these instructions.  Will watch your condition.  Will get help right away if you are not doing  well or get worse. Document Released: 08/10/2007 Document Revised: 01/05/2012 Document Reviewed: 08/10/2007 [facility] Patient Information 2014 [place], [place]. Cholesterol Cholesterol is a white, waxy, fat-like protein needed by your body in small amounts. The liver makes all the cholesterol you need. It is carried from the liver by the blood through the blood vessels. Deposits (plaque) may build up on blood vessel walls. This makes the arteries narrower and stiffer. Plaque increases the risk for heart attack and stroke. You cannot feel your cholesterol level even if it is very high. The only way to know is by a blood test to check your lipid (fats) levels. Once you know your cholesterol levels, you should keep a record of the test results. Work with your caregiver to to keep your levels in the desired range. WHAT THE RESULTS MEAN:  Total cholesterol is a rough measure of all the cholesterol in your blood.  LDL is the so-called bad cholesterol. This is the type that deposits cholesterol in the walls of the arteries. You want this level to be low.  HDL is the good cholesterol because it cleans the arteries and carries the LDL away. You want this level to be high.  Triglycerides are fat that the body can either burn for energy or store. High levels are closely linked to heart disease. DESIRED LEVELS:  Total cholesterol below 200.  LDL below 100 for people at risk, below 70 for very high risk.  HDL above 50 is good, above 60 is best.  Triglycerides below 150. HOW TO LOWER YOUR CHOLESTEROL:  Diet.  Choose fish or white meat chicken and [place], roasted or baked. Limit fatty cuts of red meat, fried foods, and processed meats, such as sausage and lunch meat.  Eat lots of fresh fruits and vegetables. Choose whole grains, beans, pasta, potatoes and cereals.  Use only small amounts of olive, corn or canola oils. Avoid butter, mayonnaise, shortening or palm kernel oils. Avoid foods with  trans-fats.  Use skim/nonfat milk and low-fat/nonfat yogurt and cheeses. Avoid whole milk, cream, ice cream, egg yolks and cheeses. Healthy desserts include angel food cake, ginger snaps, animal crackers, hard candy, popsicles, and low-fat/nonfat frozen yogurt. Avoid pastries, cakes, pies and cookies.  Exercise.  A regular program helps decrease LDL and raises HDL.  Helps with weight control.  Do things that increase your activity level like gardening, walking, or taking the stairs.  Medication.  May be prescribed by your caregiver to help lowering cholesterol and the risk for heart disease.  You may need medicine even if your levels are normal if you have several risk factors. HOME CARE INSTRUCTIONS   Follow your diet and exercise programs as suggested by your caregiver.  Take medications as directed.  Have blood work done when your caregiver feels it is necessary. MAKE SURE YOU:   Understand these instructions.  Will watch your condition.  Will get help right away if you are not doing well or get worse. Document Released:  07/08/2001 Document Revised: 01/05/2012 Document Reviewed: 12/29/2007 [employer] Patient Information 2014 [employer], [place]. 1800 Calorie Diet for Diabetes Meal Planning The 1800 calorie diet is designed for eating up to 1800 calories each day. Following this diet and making healthy meal choices can help improve overall health. This diet controls blood sugar (glucose) levels and can also help lower blood pressure and cholesterol. SERVING SIZES Measuring foods and serving sizes helps to make sure you are getting the right amount of food. The list below tells how big or small some common serving sizes are:  1 oz.........4 stacked dice.  3 oz........Marland KitchenDeck of cards.  1 tsp.......Marland KitchenTip of little finger.  1 tbs......Marland KitchenMarland KitchenThumb.  2 tbs.......Marland KitchenGolf ball.   cup......Marland KitchenHalf of a fist.  1 cup.......Marland KitchenA fist. GUIDELINES FOR CHOOSING FOODS The goal of this diet is to  eat a variety of foods and limit calories to 1800 each day. This can be done by choosing foods that are low in calories and fat. The diet also suggests eating small amounts of food frequently. Doing this helps control your blood glucose levels so they do not get too high or too low. Each meal or snack may include a protein food source to help you feel more satisfied and to stabilize your blood glucose. Try to eat about the same amount of food around the same time each day. This includes weekend days, travel days, and days off work. Space your meals about 4 to 5 hours apart and add a snack between them if you wish.  For example, a daily food plan could include breakfast, a morning snack, lunch, dinner, and an evening snack. Healthy meals and snacks include whole grains, vegetables, fruits, lean meats, poultry, fish, and dairy products. As you plan your meals, select a variety of foods. Choose from the bread and starch, vegetable, fruit, dairy, and meat/protein groups. Examples of foods from each group and their suggested serving sizes are listed below. Use measuring cups and spoons to become familiar with what a healthy portion looks like. Bread and Starch Each serving equals 15 grams of carbohydrates.  1 slice bread.   bagel.   cup cold cereal (unsweetened).   cup hot cereal or mashed potatoes.  1 small potato (size of a computer mouse).   cup cooked pasta or rice.   English muffin.  1 cup broth-based soup.  3 cups of popcorn.  4 to 6 whole-wheat crackers.   cup cooked beans, peas, or corn. Vegetable Each serving equals 5 grams of carbohydrates.   cup cooked vegetables.  1 cup raw vegetables.   cup tomato or vegetable juice. Fruit Each serving equals 15 grams of carbohydrates.  1 small apple or orange.  1 cup watermelon or strawberries.   cup applesauce (no sugar added).  2 tbs raisins.   banana.   cup canned fruit, packed in water, its own juice, or  sweetened with a sugar substitute.   cup unsweetened fruit juice. Dairy Each serving equals 12 to 15 grams of carbohydrates.  1 cup fat-free milk.  6 oz artificially sweetened yogurt or plain yogurt.  1 cup low-fat buttermilk.  1 cup soy milk.  1 cup almond milk. Meat/Protein  1 large egg.  2 to 3 oz meat, poultry, or fish.   cup low-fat cottage cheese.  1 tbs peanut butter.  1 oz low-fat cheese.   cup tuna in water.   cup tofu. Fat  1 tsp oil.  1 tsp trans-fat-free margarine.  1 tsp butter.  1 tsp mayonnaise.  2 tbs avocado.  1 tbs salad dressing.  1 tbs cream cheese.  2 tbs sour cream. SAMPLE 1800 CALORIE DIET PLAN Breakfast   cup unsweetened cereal (1 carb serving).  1 cup fat-free milk (1 carb serving).  1 slice whole-wheat toast (1 carb serving).   small banana (1 carb serving).  1 scrambled egg.  1 tsp trans-fat-free margarine. Lunch  Tuna sandwich.  2 slices whole-wheat bread (2 carb servings).   cup canned tuna in water, drained.  1 tbs reduced fat mayonnaise.  1 stalk celery, chopped.  2 slices tomato.  1 lettuce leaf.  1 cup carrot sticks.  24 to 30 seedless grapes (2 carb servings).  6 oz light yogurt (1 carb serving). Afternoon Snack  3 graham cracker squares (1 carb serving).  Fat-free milk, 1 cup (1 carb serving).  1 tbs peanut butter. Dinner  3 oz salmon, broiled with 1 tsp oil.  1 cup mashed potatoes (2 carb servings) with 1 tsp trans-fat-free margarine.  1 cup fresh or frozen green beans.  1 cup steamed asparagus.  1 cup fat-free milk (1 carb serving). Evening Snack  3 cups air-popped popcorn (1 carb serving).  2 tbs parmesan cheese sprinkled on top. MEAL PLAN Use this worksheet to help you make a daily meal plan based on the 1800 calorie diet suggestions. If you are using this plan to help you control your blood glucose, you may interchange carbohydrate-containing foods (dairy, starches,  and fruits). Select a variety of fresh foods of varying colors and flavors. The total amount of carbohydrate in your meals or snacks is more important than making sure you include all of the food groups every time you eat. Choose from the following foods to build your day's meals:  8 Starches.  4 Vegetables.  3 Fruits.  2 Dairy.  6 to 7 oz Meat/Protein.  Up to 4 Fats. Your dietician can use this worksheet to help you decide how many servings and which types of foods are right for you. BREAKFAST Food Group and Servings / Food Choice Starch ________________________________________________________ Dairy _________________________________________________________ Fruit _________________________________________________________ Meat/Protein __________________________________________________ Fat ___________________________________________________________ LUNCH Food Group and Servings / Food Choice Starch ________________________________________________________ Meat/Protein __________________________________________________ Vegetable _____________________________________________________ Fruit _________________________________________________________ Dairy _________________________________________________________ Fat ___________________________________________________________ Aura Fey Food Group and Servings / Food Choice Starch ________________________________________________________ Meat/Protein __________________________________________________ Fruit __________________________________________________________ Dairy _________________________________________________________ Laural Golden Food Group and Servings / Food Choice Starch _________________________________________________________ Meat/Protein ___________________________________________________ Dairy __________________________________________________________ Vegetable ______________________________________________________ Fruit  ___________________________________________________________ Fat ____________________________________________________________ Lollie Sails Food Group and Servings / Food Choice Fruit __________________________________________________________ Meat/Protein ___________________________________________________ Dairy __________________________________________________________ Starch _________________________________________________________ DAILY TOTALS Starch ____________________________ Vegetable _________________________ Fruit _____________________________ Dairy _____________________________ Meat/Protein______________________ Fat _______________________________ Document Released: 05/05/2005 Document Revised: 01/05/2012 Document Reviewed: 08/29/2011 [employer] Patient Information 2014 [employer].

## 2013-06-17 ENCOUNTER — Ambulatory Visit: Payer: No Typology Code available for payment source | Attending: Family Medicine | Admitting: Internal Medicine

## 2013-06-17 VITALS — BP 129/87 | HR 84 | Temp 98.0°F | Resp 16 | Wt 158.0 lb

## 2013-06-17 DIAGNOSIS — E785 Hyperlipidemia, unspecified: Secondary | ICD-10-CM | POA: Insufficient documentation

## 2013-06-17 DIAGNOSIS — E119 Type 2 diabetes mellitus without complications: Secondary | ICD-10-CM | POA: Insufficient documentation

## 2013-06-17 DIAGNOSIS — R7989 Other specified abnormal findings of blood chemistry: Secondary | ICD-10-CM

## 2013-06-17 LAB — POCT GLYCOSYLATED HEMOGLOBIN (HGB A1C): Hemoglobin A1C: 6.2

## 2013-06-17 LAB — GLUCOSE, POCT (MANUAL RESULT ENTRY): POC Glucose: 101 mg/dl — AB (ref 70–99)

## 2013-06-17 MED ORDER — ATORVASTATIN CALCIUM 20 MG PO TABS: 20.0000 mg | ORAL_TABLET | Freq: Every day | ORAL | Status: DC

## 2013-06-17 NOTE — Progress Notes (Signed)
Patient ID: Mallory Wall, female   DOB: September 15, 1961, 52 y.o.   MRN: [id number]  CC:  HPI: 52 year old female is here for evaluation of her diabetes, last hemoglobin A1c on 5/16 was 6.0. Repeat hemoglobin A1c is pending. Patient not interested in starting any medication for diabetes at this time. She would like to exercise lose weight and eat healthy Have a repeat A1c in a couple of months before starting any medication for diabetes. I also discussed with her her LDL of 156. She is agreeable to get started on a statin No Known Allergies Past Medical History  Diagnosis Date  . Headache(784.0)   . Allergy    Current Outpatient Prescriptions on File Prior to Visit  Medication Sig Dispense Refill  . acetaminophen (TYLENOL) 325 MG tablet Take 2 tablets (650 mg total) by mouth every 6 (six) hours as needed for pain.      . diphenhydramine-acetaminophen (TYLENOL PM) 25-500 MG TABS Take 2 tablets by mouth at bedtime as needed (for sleep).  14 tablet    . fluconazole (DIFLUCAN) 150 MG tablet Take 1 tablet (150 mg total) by mouth once. Repeat if needed  1 tablet  1   No current facility-administered medications on file prior to visit.   Family History  Problem Relation Age of Onset  . CAD Mother   . Hypertension Mother   . Diabetes type II Sister    History   Social History  . Marital Status: Single    Spouse Name: N/A    Number of Children: N/A  . Years of Education: N/A   Occupational History  . Not on file.   Social History Main Topics  . Smoking status: Current Every Day Smoker -- 1.00 packs/day  . Smokeless tobacco: Not on file  . Alcohol Use: No  . Drug Use: No  . Sexual Activity: Not Currently   Other Topics Concern  . Not on file   Social History Narrative   Works at [employer]    Review of Systems  Constitutional: Negative for fever, chills, diaphoresis, activity change, appetite change and fatigue.  HENT: Negative for ear pain, nosebleeds, congestion,  facial swelling, rhinorrhea, neck pain, neck stiffness and ear discharge.   Eyes: Negative for pain, discharge, redness, itching and visual disturbance.  Respiratory: Negative for cough, choking, chest tightness, shortness of breath, wheezing and stridor.   Cardiovascular: Negative for chest pain, palpitations and leg swelling.  Gastrointestinal: Negative for abdominal distention.  Genitourinary: Negative for dysuria, urgency, frequency, hematuria, flank pain, decreased urine volume, difficulty urinating and dyspareunia.  Musculoskeletal: Negative for back pain, joint swelling, arthralgias and gait problem.  Neurological: Negative for dizziness, tremors, seizures, syncope, facial asymmetry, speech difficulty, weakness, light-headedness, numbness and headaches.  Hematological: Negative for adenopathy. Does not bruise/bleed easily.  Psychiatric/Behavioral: Negative for hallucinations, behavioral problems, confusion, dysphoric mood, decreased concentration and agitation.    Objective:   Filed Vitals:   06/17/13 0950  BP: 129/87  Pulse: 84  Temp: 98 F (36.7 C)  Resp: 16    Physical Exam  Constitutional: Appears well-developed and well-nourished. No distress.  HENT: Normocephalic. External right and left ear normal. Oropharynx is clear and moist.  Eyes: Conjunctivae and EOM are normal. PERRLA, no scleral icterus.  Neck: Normal ROM. Neck supple. No JVD. No tracheal deviation. No thyromegaly.  CVS: RRR, S1/S2 +, no murmurs, no gallops, no carotid bruit.  Pulmonary: Effort and breath sounds normal, no stridor, rhonchi, wheezes, rales.  Abdominal: Soft. BS +,  no  distension, tenderness, rebound or guarding.  Musculoskeletal: Normal range of motion. No edema and no tenderness.  Lymphadenopathy: No lymphadenopathy noted, cervical, inguinal. Neuro: Alert. Normal reflexes, muscle tone coordination. No cranial nerve deficit. Skin: Skin is warm and dry. No rash noted. Not diaphoretic. No  erythema. No pallor.  Psychiatric: Normal mood and affect. Behavior, judgment, thought content normal.   Lab Results  Component Value Date   WBC 6.5 02/13/2013   HGB 10.9* 02/13/2013   HCT 31.9* 02/13/2013   MCV 86.0 02/13/2013   PLT 245 02/13/2013   Lab Results  Component Value Date   CREATININE 0.62 03/11/2013   BUN 6 03/11/2013   NA 141 03/11/2013   K 3.8 03/11/2013   CL 103 03/11/2013   CO2 27 03/11/2013    Lab Results  Component Value Date   HGBA1C 6.0* 03/11/2013   Lipid Panel     Component Value Date/Time   CHOL 215* 03/11/2013 1137   TRIG 74 03/11/2013 1137   HDL 44 03/11/2013 1137   CHOLHDL 4.9 03/11/2013 1137   VLDL 15 03/11/2013 1137   LDLCALC 156* 03/11/2013 1137       Assessment and plan:   Patient Active Problem List   Diagnosis Date Noted  . DDD (degenerative disc disease), lumbar 03/18/2013  . History of pyelonephritis 03/18/2013  . Vagina, candidiasis 03/18/2013  . Prediabetes 03/18/2013  . Dyslipidemia 03/18/2013       Dyslipidemia Start the patient on Lipitor 20 mg a day   Diabetes type 2 Repeat A1c 6.2 Patient will come back in 2 months for a repeat hemoglobin A1c Not interested in starting treatment for this at this time

## 2013-06-17 NOTE — Progress Notes (Signed)
Here for follow up Borderline diabetes

## 2013-09-19 ENCOUNTER — Ambulatory Visit: Payer: Self-pay | Attending: Internal Medicine

## 2013-09-19 DIAGNOSIS — R7989 Other specified abnormal findings of blood chemistry: Secondary | ICD-10-CM | POA: Insufficient documentation

## 2013-09-19 LAB — LIPID PANEL
HDL: 46 mg/dL (ref >39)
Triglycerides: 52 mg/dL (ref <150)

## 2013-09-29 ENCOUNTER — Ambulatory Visit: Payer: No Typology Code available for payment source | Attending: Internal Medicine

## 2013-10-31 ENCOUNTER — Other Ambulatory Visit (HOSPITAL_COMMUNITY)
Admission: RE | Admit: 2013-10-31 | Discharge: 2013-10-31 | Disposition: A | Discharge status: Home / Self Care | Payer: No Typology Code available for payment source | Source: Ambulatory Visit | Attending: Internal Medicine | Admitting: Internal Medicine

## 2013-10-31 ENCOUNTER — Encounter: Payer: Self-pay | Admitting: Internal Medicine

## 2013-10-31 ENCOUNTER — Ambulatory Visit: Payer: No Typology Code available for payment source | Attending: Internal Medicine | Admitting: Internal Medicine

## 2013-10-31 VITALS — BP 138/92 | HR 85 | Temp 98.9°F | Resp 16 | Ht 62.0 in | Wt 162.0 lb

## 2013-10-31 DIAGNOSIS — B373 Candidiasis of vulva and vagina: Secondary | ICD-10-CM

## 2013-10-31 DIAGNOSIS — N760 Acute vaginitis: Secondary | ICD-10-CM | POA: Insufficient documentation

## 2013-10-31 DIAGNOSIS — R87810 Cervical high risk human papillomavirus (HPV) DNA test positive: Secondary | ICD-10-CM | POA: Insufficient documentation

## 2013-10-31 DIAGNOSIS — Z113 Encounter for screening for infections with a predominantly sexual mode of transmission: Secondary | ICD-10-CM | POA: Insufficient documentation

## 2013-10-31 DIAGNOSIS — Z0000 Encounter for general adult medical examination without abnormal findings: Secondary | ICD-10-CM | POA: Insufficient documentation

## 2013-10-31 DIAGNOSIS — A499 Bacterial infection, unspecified: Secondary | ICD-10-CM

## 2013-10-31 DIAGNOSIS — B9689 Other specified bacterial agents as the cause of diseases classified elsewhere: Secondary | ICD-10-CM | POA: Insufficient documentation

## 2013-10-31 DIAGNOSIS — Z1151 Encounter for screening for human papillomavirus (HPV): Secondary | ICD-10-CM | POA: Insufficient documentation

## 2013-10-31 DIAGNOSIS — E785 Hyperlipidemia, unspecified: Secondary | ICD-10-CM

## 2013-10-31 DIAGNOSIS — R7303 Prediabetes: Secondary | ICD-10-CM

## 2013-10-31 DIAGNOSIS — B3731 Acute candidiasis of vulva and vagina: Secondary | ICD-10-CM

## 2013-10-31 DIAGNOSIS — Z124 Encounter for screening for malignant neoplasm of cervix: Secondary | ICD-10-CM | POA: Insufficient documentation

## 2013-10-31 DIAGNOSIS — R7309 Other abnormal glucose: Secondary | ICD-10-CM

## 2013-10-31 MED ORDER — METRONIDAZOLE 500 MG PO TABS: 500.0000 mg | ORAL_TABLET | Freq: Three times a day (TID) | ORAL | Status: DC

## 2013-10-31 MED ORDER — FLUCONAZOLE 150 MG PO TABS: 150.0000 mg | ORAL_TABLET | Freq: Once | ORAL | Status: DC

## 2013-10-31 NOTE — Progress Notes (Signed)
Patient ID: Mallory Wall, female   DOB: Nov 01, 1960, 53 y.o.   MRN: [id number] Patient Demographics  Mallory Wall, is a 53 y.o. female  [id number]  [id number]  DOB - [birth year]  Chief Complaint  Patient presents with  . Follow-up        Subjective:   Mallory Wall is a 53 y.o. female here today for a follow up visit. Patient is here for Pap smear. complains of frequent bacterial vaginosis and discharges, she denies douching. She claims she is doing very well, she does not want to start medication for prediabetes even though she was counseled on her last hemoglobin A1c of 6.4%. She also stopped taking her Lipitor for dyslipidemia because she claims she's not a "pill person". She continue to smoke cigarette about one pack per day. She denies the use of illicit drugs, she does not drink alcohol. She has no other symptoms or complaints today. Patient has No headache, No chest pain, No abdominal pain - No Nausea, No new weakness tingling or numbness, No Cough - SOB.  ALLERGIES: No Known Allergies  PAST MEDICAL HISTORY: Past Medical History  Diagnosis Date  . Headache(784.0)   . Allergy     MEDICATIONS AT HOME: Prior to Admission medications   Medication Sig Start Date End Date Taking? Authorizing Provider  acetaminophen (TYLENOL) 325 MG tablet Take 2 tablets (650 mg total) by mouth every 6 (six) hours as needed for pain. 02/24/13   Shanker Kristeen Mans, MD  atorvastatin (LIPITOR) 20 MG tablet Take 1 tablet (20 mg total) by mouth daily. 06/17/13   Reyne Dumas, MD  diphenhydramine-acetaminophen (TYLENOL PM) 25-500 MG TABS Take 2 tablets by mouth at bedtime as needed (for sleep). 02/24/13   Shanker Kristeen Mans, MD  fluconazole (DIFLUCAN) 150 MG tablet Take 1 tablet (150 mg total) by mouth once. Repeat if needed 10/31/13   Angelica Chessman, MD  metroNIDAZOLE (FLAGYL) 500 MG tablet Take 1 tablet (500 mg total) by mouth 3 (three) times daily. 10/31/13   Angelica Chessman, MD      Objective:   Filed Vitals:   10/31/13 1408  BP: 138/92  Pulse: 85  Temp: 98.9 F (37.2 C)  TempSrc: Oral  Resp: 16  Height: 5\' 2"  (1.575 m)  Weight: 162 lb (73.483 kg)  SpO2: 97%    Exam General appearance : Awake, alert, not in any distress. Speech Clear. Not toxic looking HEENT: Atraumatic and Normocephalic, pupils equally reactive to light and accomodation Neck: supple, no JVD. No cervical lymphadenopathy.  Chest:Good air entry bilaterally, no added sounds  CVS: S1 S2 regular, no murmurs.  Abdomen: Bowel sounds present, Non tender and not distended with no gaurding, rigidity or rebound. Extremities: B/L Lower Ext shows no edema, both legs are warm to touch Neurology: Awake alert, and oriented X 3, CN II-XII intact, Non focal Skin:No Rash Wounds:N/A Pelvic examination: Normal female external genitalia, copious discharge in the introitus, cervix and os very difficult to locate, no obvious bleeding to contact, cervical motion tenderness negative  Data Review   CBC No results found for this basename: WBC, HGB, HCT, PLT, MCV, MCH, MCHC, RDW, NEUTRABS, LYMPHSABS, MONOABS, EOSABS, BASOSABS, BANDABS, BANDSABD,  in the last 168 hours  Chemistries   No results found for this basename: NA, K, CL, CO2, GLUCOSE, BUN, CREATININE, GFRCGP, CALCIUM, MG, AST, ALT, ALKPHOS, BILITOT,  in the last 168 hours ------------------------------------------------------------------------------------------------------------------ No results found for this basename: HGBA1C,  in the last 72 hours ------------------------------------------------------------------------------------------------------------------ No results found for this basename:  CHOL, HDL, LDLCALC, TRIG, CHOLHDL, LDLDIRECT,  in the last 72 hours ------------------------------------------------------------------------------------------------------------------ No results found for this basename: TSH, T4TOTAL, FREET3, T3FREE,  THYROIDAB,  in the last 72 hours ------------------------------------------------------------------------------------------------------------------ No results found for this basename: VITAMINB12, FOLATE, FERRITIN, TIBC, IRON, RETICCTPCT,  in the last 72 hours  Coagulation profile  No results found for this basename: INR, PROTIME,  in the last 168 hours    Assessment & Plan   1. Vagina, candidiasis  - Cervicovaginal ancillary only - fluconazole (DIFLUCAN) 150 MG tablet; Take 1 tablet (150 mg total) by mouth once. Repeat if needed  Dispense: 1 tablet; Refill: 1  2. Prediabetes Patient extensively counseled about nutrition and exercise Patient was instructed that if hemoglobin A1c remains at 6.4 or higher at the next visit she will need to start medication  3. Dyslipidemia Patient extensively counseled about nutrition and exercise Patient encouraged to restart her Lipitor at 20 mg tablet by mouth daily  4. Annual physical exam  - Cytology - PAP [place] - Cervicovaginal ancillary only - MM Digital Screening; Future  5. Bacterial vaginosis Prescribed - metroNIDAZOLE (FLAGYL) 500 MG tablet; Take 1 tablet (500 mg total) by mouth 3 (three) times daily.  Dispense: 21 tablet; Refill: 0   Health Maintenance -Colonoscopy: Patient wants to defer to another time -Pap Smear: Done today -Mammogram: Schedule Patient extensively counseled about smoking cessation  Follow up in 3 months or when necessary   The patient was given clear instructions to go to ER or return to medical center if symptoms don't improve, worsen or new problems develop. The patient verbalized understanding. The patient was told to call to get lab results if they haven't heard anything in the next week.    Angelica Chessman, MD, [place] [place] [place] [facility] [place], [place]   10/31/2013, 4:45 PM

## 2013-10-31 NOTE — Progress Notes (Signed)
Pt is here today for a physical and a pap smear.

## 2013-10-31 NOTE — Patient Instructions (Signed)
Hypertriglyceridemia  Diet for High blood levels of Triglycerides Most fats in food are triglycerides. Triglycerides in your blood are stored as fat in your body. High levels of triglycerides in your blood may put you at a greater risk for heart disease and stroke.  Normal triglyceride levels are less than 150 mg/dL. Borderline high levels are 150-199 mg/dl. High levels are 200 - 499 mg/dL, and very high triglyceride levels are greater than 500 mg/dL. The decision to treat high triglycerides is generally based on the level. For people with borderline or high triglyceride levels, treatment includes weight loss and exercise. Drugs are recommended for people with very high triglyceride levels. Many people who need treatment for high triglyceride levels have metabolic syndrome. This syndrome is a collection of disorders that often include: insulin resistance, high blood pressure, blood clotting problems, high cholesterol and triglycerides. TESTING PROCEDURE FOR TRIGLYCERIDES  You should not eat 4 hours before getting your triglycerides measured. The normal range of triglycerides is between 10 and 250 milligrams per deciliter (mg/dl). Some people may have extreme levels (1000 or above), but your triglyceride level may be too high if it is above 150 mg/dl, depending on what other risk factors you have for heart disease.  People with high blood triglycerides may also have high blood cholesterol levels. If you have high blood cholesterol as well as high blood triglycerides, your risk for heart disease is probably greater than if you only had high triglycerides. High blood cholesterol is one of the main risk factors for heart disease. CHANGING YOUR DIET  Your weight can affect your blood triglyceride level. If you are more than 20% above your ideal body weight, you may be able to lower your blood triglycerides by losing weight. Eating less and exercising regularly is the best way to combat this. Fat provides more  calories than any other food. The best way to lose weight is to eat less fat. Only 30% of your total calories should come from fat. Less than 7% of your diet should come from saturated fat. A diet low in fat and saturated fat is the same as a diet to decrease blood cholesterol. By eating a diet lower in fat, you may lose weight, lower your blood cholesterol, and lower your blood triglyceride level.  Eating a diet low in fat, especially saturated fat, may also help you lower your blood triglyceride level. Ask your dietitian to help you figure how much fat you can eat based on the number of calories your caregiver has prescribed for you.  Exercise, in addition to helping with weight loss may also help lower triglyceride levels.   Alcohol can increase blood triglycerides. You may need to stop drinking alcoholic beverages.  Too much carbohydrate in your diet may also increase your blood triglycerides. Some complex carbohydrates are necessary in your diet. These may include bread, rice, potatoes, other starchy vegetables and cereals.  Reduce "simple" carbohydrates. These may include pure sugars, candy, honey, and jelly without losing other nutrients. If you have the kind of high blood triglycerides that is affected by the amount of carbohydrates in your diet, you will need to eat less sugar and less high-sugar foods. Your caregiver can help you with this.  Adding 2-4 grams of fish oil (EPA+ DHA) may also help lower triglycerides. Speak with your caregiver before adding any supplements to your regimen. Following the Diet  Maintain your ideal weight. Your caregivers can help you with a diet. Generally, eating less food and getting more   exercise will help you lose weight. Joining a weight control group may also help. Ask your caregivers for a good weight control group in your area.  Eat low-fat foods instead of high-fat foods. This can help you lose weight too.  These foods are lower in fat. Eat MORE of these:    Dried beans, peas, and lentils.  Egg whites.  Low-fat cottage cheese.  Fish.  Lean cuts of meat, such as round, sirloin, rump, and flank (cut extra fat off meat you fix).  Whole grain breads, cereals and pasta.  Skim and nonfat dry milk.  Low-fat yogurt.  Poultry without the skin.  Cheese made with skim or part-skim milk, such as mozzarella, parmesan, farmers', ricotta, or pot cheese. These are higher fat foods. Eat LESS of these:   Whole milk and foods made from whole milk, such as American, blue, cheddar, monterey jack, and swiss cheese  High-fat meats, such as luncheon meats, sausages, knockwurst, bratwurst, hot dogs, ribs, corned beef, ground pork, and regular ground beef.  Fried foods. Limit saturated fats in your diet. Substituting unsaturated fat for saturated fat may decrease your blood triglyceride level. You will need to read package labels to know which products contain saturated fats.  These foods are high in saturated fat. Eat LESS of these:   Fried pork skins.  Whole milk.  Skin and fat from poultry.  Palm oil.  Butter.  Shortening.  Cream cheese.  Bacon.  Margarines and baked goods made from listed oils.  Vegetable shortenings.  Chitterlings.  Fat from meats.  Coconut oil.  Palm kernel oil.  Lard.  Cream.  Sour cream.  Fatback.  Coffee whiteners and non-dairy creamers made with these oils.  Cheese made from whole milk. Use unsaturated fats (both polyunsaturated and monounsaturated) moderately. Remember, even though unsaturated fats are better than saturated fats; you still want a diet low in total fat.  These foods are high in unsaturated fat:   Canola oil.  Sunflower oil.  Mayonnaise.  Almonds.  Peanuts.  Pine nuts.  Margarines made with these oils.  Safflower oil.  Olive oil.  Avocados.  Cashews.  Peanut butter.  Sunflower seeds.  Soybean oil.  Peanut  oil.  Olives.  Pecans.  Walnuts.  Pumpkin seeds. Avoid sugar and other high-sugar foods. This will decrease carbohydrates without decreasing other nutrients. Sugar in your food goes rapidly to your blood. When there is excess sugar in your blood, your liver may use it to make more triglycerides. Sugar also contains calories without other important nutrients.  Eat LESS of these:   Sugar, brown sugar, powdered sugar, jam, jelly, preserves, honey, syrup, molasses, pies, candy, cakes, cookies, frosting, pastries, colas, soft drinks, punches, fruit drinks, and regular gelatin.  Avoid alcohol. Alcohol, even more than sugar, may increase blood triglycerides. In addition, alcohol is high in calories and low in nutrients. Ask for sparkling water, or a diet soft drink instead of an alcoholic beverage. Suggestions for planning and preparing meals   Bake, broil, grill or roast meats instead of frying.  Remove fat from meats and skin from poultry before cooking.  Add spices, herbs, lemon juice or vinegar to vegetables instead of salt, rich sauces or gravies.  Use a non-stick skillet without fat or use no-stick sprays.  Cool and refrigerate stews and broth. Then remove the hardened fat floating on the surface before serving.  Refrigerate meat drippings and skim off fat to make low-fat gravies.  Serve more fish.  Use less butter,   margarine and other high-fat spreads on bread or vegetables.  Use skim or reconstituted non-fat dry milk for cooking.  Cook with low-fat cheeses.  Substitute low-fat yogurt or cottage cheese for all or part of the sour cream in recipes for sauces, dips or congealed salads.  Use half yogurt/half mayonnaise in salad recipes.  Substitute evaporated skim milk for cream. Evaporated skim milk or reconstituted non-fat dry milk can be whipped and substituted for whipped cream in certain recipes.  Choose fresh fruits for dessert instead of high-fat foods such as pies or  cakes. Fruits are naturally low in fat. When Dining Out   Order low-fat appetizers such as fruit or vegetable juice, pasta with vegetables or tomato sauce.  Select clear, rather than cream soups.  Ask that dressings and gravies be served on the side. Then use less of them.  Order foods that are baked, broiled, poached, steamed, stir-fried, or roasted.  Ask for margarine instead of butter, and use only a small amount.  Drink sparkling water, unsweetened tea or coffee, or diet soft drinks instead of alcohol or other sweet beverages. QUESTIONS AND ANSWERS ABOUT OTHER FATS IN THE BLOOD: SATURATED FAT, TRANS FAT, AND CHOLESTEROL What is trans fat? Trans fat is a type of fat that is formed when vegetable oil is hardened through a process called hydrogenation. This process helps makes foods more solid, gives them shape, and prolongs their shelf life. Trans fats are also called hydrogenated or partially hydrogenated oils.  What do saturated fat, trans fat, and cholesterol in foods have to do with heart disease? Saturated fat, trans fat, and cholesterol in the diet all raise the level of LDL "bad" cholesterol in the blood. The higher the LDL cholesterol, the greater the risk for coronary heart disease (CHD). Saturated fat and trans fat raise LDL similarly.  What foods contain saturated fat, trans fat, and cholesterol? High amounts of saturated fat are found in animal products, such as fatty cuts of meat, chicken skin, and full-fat dairy products like butter, whole milk, cream, and cheese, and in tropical vegetable oils such as palm, palm kernel, and coconut oil. Trans fat is found in some of the same foods as saturated fat, such as vegetable shortening, some margarines (especially hard or stick margarine), crackers, cookies, baked goods, fried foods, salad dressings, and other processed foods made with partially hydrogenated vegetable oils. Small amounts of trans fat also occur naturally in some animal  products, such as milk products, beef, and lamb. Foods high in cholesterol include liver, other organ meats, egg yolks, shrimp, and full-fat dairy products. How can I use the new food label to make heart-healthy food choices? Check the Nutrition Facts panel of the food label. Choose foods lower in saturated fat, trans fat, and cholesterol. For saturated fat and cholesterol, you can also use the Percent Daily Value (%DV): 5% DV or less is low, and 20% DV or more is high. (There is no %DV for trans fat.) Use the Nutrition Facts panel to choose foods low in saturated fat and cholesterol, and if the trans fat is not listed, read the ingredients and limit products that list shortening or hydrogenated or partially hydrogenated vegetable oil, which tend to be high in trans fat. POINTS TO REMEMBER:   Discuss your risk for heart disease with your caregivers, and take steps to reduce risk factors.  Change your diet. Choose foods that are low in saturated fat, trans fat, and cholesterol.  Add exercise to your daily routine if   it is not already being done. Participate in physical activity of moderate intensity, like brisk walking, for at least 30 minutes on most, and preferably all days of the week. No time? Break the 30 minutes into three, 10-minute segments during the day.  Stop smoking. If you do smoke, contact your caregiver to discuss ways in which they can help you quit.  Do not use street drugs.  Maintain a normal weight.  Maintain a healthy blood pressure.  Keep up with your blood work for checking the fats in your blood as directed by your caregiver. Document Released: 07/31/2004 Document Revised: 04/13/2012 Document Reviewed: 02/26/2009 [employer] Patient Information 2014 [employer].  

## 2013-11-03 ENCOUNTER — Telehealth: Payer: Self-pay

## 2013-11-03 NOTE — Telephone Encounter (Signed)
Message copied by Dorothe Pea on Thu Nov 03, 2013  1:35 PM ------      Message from: Mallory Wall      Created: Wed Nov 02, 2013  5:31 PM       Please inform patient that her Pap smear is normal and there is no infection ------

## 2013-11-03 NOTE — Telephone Encounter (Signed)
Spoke with patient and she is aware of her pap smear results

## 2013-11-24 ENCOUNTER — Other Ambulatory Visit: Payer: Self-pay | Admitting: Internal Medicine

## 2013-11-29 ENCOUNTER — Ambulatory Visit (HOSPITAL_COMMUNITY)
Admission: RE | Admit: 2013-11-29 | Discharge: 2013-11-29 | Disposition: A | Discharge status: Home / Self Care | Payer: Self-pay | Source: Ambulatory Visit | Attending: Internal Medicine | Admitting: Internal Medicine

## 2013-11-29 DIAGNOSIS — Z0000 Encounter for general adult medical examination without abnormal findings: Secondary | ICD-10-CM

## 2013-12-01 ENCOUNTER — Telehealth: Payer: Self-pay | Admitting: *Deleted

## 2013-12-01 NOTE — Telephone Encounter (Signed)
I spoke to the pt and told her that her mammogram is normal, negative for malignancy.

## 2013-12-01 NOTE — Telephone Encounter (Signed)
Message copied by Joan Mayans on Thu Dec 01, 2013  2:36 PM ------      Message from: Angelica Chessman E      Created: Wed Nov 30, 2013 11:36 AM       Please inform patient that her mammogram is normal, negative for malignancy ------

## 2014-01-30 ENCOUNTER — Encounter: Payer: Self-pay | Admitting: Internal Medicine

## 2014-01-30 ENCOUNTER — Ambulatory Visit: Payer: No Typology Code available for payment source | Attending: Internal Medicine | Admitting: Internal Medicine

## 2014-01-30 VITALS — BP 140/80 | HR 82 | Temp 99.1°F | Resp 16 | Wt 161.8 lb

## 2014-01-30 DIAGNOSIS — R030 Elevated blood-pressure reading, without diagnosis of hypertension: Secondary | ICD-10-CM

## 2014-01-30 DIAGNOSIS — Z09 Encounter for follow-up examination after completed treatment for conditions other than malignant neoplasm: Secondary | ICD-10-CM | POA: Insufficient documentation

## 2014-01-30 DIAGNOSIS — E785 Hyperlipidemia, unspecified: Secondary | ICD-10-CM

## 2014-01-30 DIAGNOSIS — I10 Essential (primary) hypertension: Secondary | ICD-10-CM | POA: Insufficient documentation

## 2014-01-30 DIAGNOSIS — R7303 Prediabetes: Secondary | ICD-10-CM

## 2014-01-30 DIAGNOSIS — F17200 Nicotine dependence, unspecified, uncomplicated: Secondary | ICD-10-CM | POA: Insufficient documentation

## 2014-01-30 DIAGNOSIS — R7309 Other abnormal glucose: Secondary | ICD-10-CM | POA: Insufficient documentation

## 2014-01-30 DIAGNOSIS — B379 Candidiasis, unspecified: Secondary | ICD-10-CM | POA: Insufficient documentation

## 2014-01-30 DIAGNOSIS — IMO0001 Reserved for inherently not codable concepts without codable children: Secondary | ICD-10-CM

## 2014-01-30 DIAGNOSIS — B373 Candidiasis of vulva and vagina: Secondary | ICD-10-CM

## 2014-01-30 DIAGNOSIS — B3731 Acute candidiasis of vulva and vagina: Secondary | ICD-10-CM

## 2014-01-30 LAB — COMPLETE METABOLIC PANEL WITH GFR
ALK PHOS: 67 U/L (ref 39–117)
ALT: 9 U/L (ref 0–35)
AST: 13 U/L (ref 0–37)
Albumin: 4.1 g/dL (ref 3.5–5.2)
BILIRUBIN TOTAL: 0.3 mg/dL (ref 0.2–1.2)
BUN: 9 mg/dL (ref 6–23)
CO2: 27 mEq/L (ref 19–32)
CREATININE: 0.59 mg/dL (ref 0.50–1.10)
Calcium: 9 mg/dL (ref 8.4–10.5)
Chloride: 105 mEq/L (ref 96–112)
GFR, Est Non African American: >89 mL/min
Glucose, Bld: 119 mg/dL — ABNORMAL HIGH (ref 70–99)
Potassium: 3.8 mEq/L (ref 3.5–5.3)
SODIUM: 142 meq/L (ref 135–145)
TOTAL PROTEIN: 7 g/dL (ref 6.0–8.3)

## 2014-01-30 LAB — LIPID PANEL
CHOL/HDL RATIO: 4.1 ratio
Cholesterol: 201 mg/dL — ABNORMAL HIGH (ref 0–200)
HDL: 49 mg/dL (ref >39)
LDL CALC: 140 mg/dL — AB (ref 0–99)
TRIGLYCERIDES: 62 mg/dL (ref <150)
VLDL: 12 mg/dL (ref 0–40)

## 2014-01-30 MED ORDER — FLUCONAZOLE 150 MG PO TABS: 150.0000 mg | ORAL_TABLET | Freq: Once | ORAL | Status: DC

## 2014-01-30 NOTE — Progress Notes (Signed)
MRN: [id number] Name: Chalyn Amescua  Sex: female Age: 53 y.o. DOB: 08/19/1961  Allergies: Review of patient's allergies indicates no known allergies.  Chief Complaint  Patient presents with  . Follow-up    HPI: Patient is 53 y.o. female who has to of prediabetes, hyperlipidemia comes today for followup, her blood pressure trend noticed to be elevated, her repeat manual blood pressure is 140/80, patient reported to have family history of hypertension, she was also treated for vaginal candidiasis, she occasionally has still vaginal discharge which is whitish in color and feels itching. Patient has been taking her cholesterol medication, she also smokes cigarettes, advised patient to quit smoking she is not ready yet.  Past Medical History  Diagnosis Date  . Headache(784.0)   . Allergy     History reviewed. No pertinent past surgical history.    Medication List       This list is accurate as of: 01/30/14 12:10 PM.  Always use your most recent med list.               acetaminophen 325 MG tablet  Commonly known as:  TYLENOL  Take 2 tablets (650 mg total) by mouth every 6 (six) hours as needed for pain.     atorvastatin 20 MG tablet  Commonly known as:  LIPITOR  Take 1 tablet (20 mg total) by mouth daily.     diphenhydramine-acetaminophen 25-500 MG Tabs  Commonly known as:  TYLENOL PM  Take 2 tablets by mouth at bedtime as needed (for sleep).     fluconazole 150 MG tablet  Commonly known as:  DIFLUCAN  Take 1 tablet (150 mg total) by mouth once. Repeat if needed     metroNIDAZOLE 500 MG tablet  Commonly known as:  FLAGYL  Take 1 tablet (500 mg total) by mouth 3 (three) times daily.        Meds ordered this encounter  Medications  . fluconazole (DIFLUCAN) 150 MG tablet    Sig: Take 1 tablet (150 mg total) by mouth once. Repeat if needed    Dispense:  1 tablet    Refill:  1     There is no immunization history on file for this patient.  Family History    Problem Relation Age of Onset  . CAD Mother   . Hypertension Mother   . Diabetes type II Sister     History  Substance Use Topics  . Smoking status: Current Every Day Smoker -- 1.00 packs/day  . Smokeless tobacco: Not on file  . Alcohol Use: No    Review of Systems   As noted in HPI  Filed Vitals:   01/30/14 1148  BP: 146/98  Pulse: 82  Temp: 99.1 F (37.3 C)  Resp: 16    Physical Exam  Physical Exam  Constitutional: No distress.  Eyes: EOM are normal. Pupils are equal, round, and reactive to light.  Cardiovascular: Normal rate and regular rhythm.   Pulmonary/Chest: Breath sounds normal. No respiratory distress. She has no wheezes. She has no rales.  Musculoskeletal: She exhibits no edema.    CBC    Component Value Date/Time   WBC 6.5 02/13/2013 0530   RBC 3.71* 02/13/2013 0530   HGB 10.9* 02/13/2013 0530   HCT 31.9* 02/13/2013 0530   PLT 245 02/13/2013 0530   MCV 86.0 02/13/2013 0530   LYMPHSABS 2.4 02/11/2013 1345   MONOABS 1.2* 02/11/2013 1345   EOSABS 0.0 02/11/2013 1345   BASOSABS 0.0 02/11/2013 1345  CMP     Component Value Date/Time   NA 141 03/11/2013 1137   K 3.8 03/11/2013 1137   CL 103 03/11/2013 1137   CO2 27 03/11/2013 1137   GLUCOSE 94 03/11/2013 1137   BUN 6 03/11/2013 1137   CREATININE 0.62 03/11/2013 1137   CREATININE 0.54 02/13/2013 0530   CALCIUM 9.9 03/11/2013 1137   PROT 6.5 02/12/2013 0600   ALBUMIN 2.9* 02/12/2013 0600   AST 13 02/12/2013 0600   ALT 8 02/12/2013 0600   ALKPHOS 53 02/12/2013 0600   BILITOT 0.2* 02/12/2013 0600   GFRNONAA >90 02/13/2013 0530   GFRAA >90 02/13/2013 0530    Lab Results  Component Value Date/Time   CHOL 200 09/19/2013  9:55 AM    No components found with this basename: hga1c    Lab Results  Component Value Date/Time   AST 13 02/12/2013  6:00 AM    Assessment and Plan  Follow up  Dyslipidemia - Plan: Will repeat her Lipid panel, she is taking Lipitor 20 mg daily.  Elevated BP Repeat manual blood  pressure is 140/80  Advise patient for DASH diet .  Smoking Advised patient to quit smoking   Prediabetes - Plan: Will repeat her COMPLETE METABOLIC PANEL WITH GFR Also advised for low carbohydrate diet  Vagina, candidiasis - Plan: fluconazole (DIFLUCAN) 150 MG tablet  Return in about 3 months (around 05/01/2014) for hypertension, hyperipidemia.  Lorayne Marek, MD

## 2014-01-30 NOTE — Patient Instructions (Addendum)
DASH Diet The DASH diet stands for "Dietary Approaches to Stop Hypertension." It is a healthy eating plan that has been shown to reduce high blood pressure (hypertension) in as little as 14 days, while also possibly providing other significant health benefits. These other health benefits include reducing the risk of breast cancer after menopause and reducing the risk of type 2 diabetes, heart disease, colon cancer, and stroke. Health benefits also include weight loss and slowing kidney failure in patients with chronic kidney disease.  DIET GUIDELINES  Limit salt (sodium). Your diet should contain less than 1500 mg of sodium daily.  Limit refined or processed carbohydrates. Your diet should include mostly whole grains. Desserts and added sugars should be used sparingly.  Include small amounts of heart-healthy fats. These types of fats include nuts, oils, and tub margarine. Limit saturated and trans fats. These fats have been shown to be harmful in the body. CHOOSING FOODS  The following food groups are based on a 2000 calorie diet. See your Registered Dietitian for individual calorie needs. Grains and Grain Products (6 to 8 servings daily)  Eat More Often: Whole-wheat bread, brown rice, whole-grain or wheat pasta, quinoa, popcorn without added fat or salt (air popped).  Eat Less Often: White bread, white pasta, white rice, cornbread. Vegetables (4 to 5 servings daily)  Eat More Often: Fresh, frozen, and canned vegetables. Vegetables may be raw, steamed, roasted, or grilled with a minimal amount of fat.  Eat Less Often/Avoid: Creamed or fried vegetables. Vegetables in a cheese sauce. Fruit (4 to 5 servings daily)  Eat More Often: All fresh, canned (in natural juice), or frozen fruits. Dried fruits without added sugar. One hundred percent fruit juice ( cup [237 mL] daily).  Eat Less Often: Dried fruits with added sugar. Canned fruit in light or heavy syrup. Lean Meats, Fish, and Poultry (2  servings or less daily. One serving is 3 to 4 oz [85-114 g]).  Eat More Often: Ninety percent or leaner ground beef, tenderloin, sirloin. Round cuts of beef, chicken breast, turkey breast. All fish. Grill, bake, or broil your meat. Nothing should be fried.  Eat Less Often/Avoid: Fatty cuts of meat, turkey, or chicken leg, thigh, or wing. Fried cuts of meat or fish. Dairy (2 to 3 servings)  Eat More Often: Low-fat or fat-free milk, low-fat plain or light yogurt, reduced-fat or part-skim cheese.  Eat Less Often/Avoid: Milk (whole, 2%).Whole milk yogurt. Full-fat cheeses. Nuts, Seeds, and Legumes (4 to 5 servings per week)  Eat More Often: All without added salt.  Eat Less Often/Avoid: Salted nuts and seeds, canned beans with added salt. Fats and Sweets (limited)  Eat More Often: Vegetable oils, tub margarines without trans fats, sugar-free gelatin. Mayonnaise and salad dressings.  Eat Less Often/Avoid: Coconut oils, palm oils, butter, stick margarine, cream, half and half, cookies, candy, pie. FOR MORE INFORMATION The Dash Diet Eating Plan: www.dashdiet.org Document Released: 10/02/2011 Document Revised: 01/05/2012 Document Reviewed: 10/02/2011 ExitCare Patient Information 2014 [employer]. Diabetes Meal Planning Guide The diabetes meal planning guide is a tool to help you plan your meals and snacks. It is important for people with diabetes to manage their blood glucose (sugar) levels. Choosing the right foods and the right amounts throughout your day will help control your blood glucose. Eating right can even help you improve your blood pressure and reach or maintain a healthy weight. CARBOHYDRATE COUNTING MADE EASY When you eat carbohydrates, they turn to sugar. This raises your blood glucose level. Counting carbohydrates   can help you control this level so you feel better. When you plan your meals by counting carbohydrates, you can have more flexibility in what you eat and balance  your medicine with your food intake. Carbohydrate counting simply means adding up the total amount of carbohydrate grams in your meals and snacks. Try to eat about the same amount at each meal. Foods with carbohydrates are listed below. Each portion below is 1 carbohydrate serving or 15 grams of carbohydrates. Ask your dietician how many grams of carbohydrates you should eat at each meal or snack. Grains and Starches  1 slice bread.   English muffin or hotdog/hamburger bun.   cup cold cereal (unsweetened).   cup cooked pasta or rice.   cup starchy vegetables (corn, potatoes, peas, beans, winter squash).  1 tortilla (6 inches).   bagel.  1 waffle or pancake (size of a CD).   cup cooked cereal.  4 to 6 small crackers. *Whole grain is recommended. Fruit  1 cup fresh unsweetened berries, melon, papaya, pineapple.  1 small fresh fruit.   banana or mango.   cup fruit juice (4 oz unsweetened).   cup canned fruit in natural juice or water.  2 tbs dried fruit.  12 to 15 grapes or cherries. Milk and Yogurt  1 cup fat-free or 1% milk.  1 cup soy milk.  6 oz light yogurt with sugar-free sweetener.  6 oz low-fat soy yogurt.  6 oz plain yogurt. Vegetables  1 cup raw or  cup cooked is counted as 0 carbohydrates or a "free" food.  If you eat 3 or more servings at 1 meal, count them as 1 carbohydrate serving. Other Carbohydrates   oz chips or pretzels.   cup ice cream or frozen yogurt.   cup sherbet or sorbet.  2 inch square cake, no frosting.  1 tbs honey, sugar, jam, jelly, or syrup.  2 small cookies.  3 squares of graham crackers.  3 cups popcorn.  6 crackers.  1 cup broth-based soup.  Count 1 cup casserole or other mixed foods as 2 carbohydrate servings.  Foods with less than 20 calories in a serving may be counted as 0 carbohydrates or a "free" food. You may want to purchase a book or computer software that lists the carbohydrate gram  counts of different foods. In addition, the nutrition facts panel on the labels of the foods you eat are a good source of this information. The label will tell you how big the serving size is and the total number of carbohydrate grams you will be eating per serving. Divide this number by 15 to obtain the number of carbohydrate servings in a portion. Remember, 1 carbohydrate serving equals 15 grams of carbohydrate. SERVING SIZES Measuring foods and serving sizes helps you make sure you are getting the right amount of food. The list below tells how big or small some common serving sizes are.  1 oz.........4 stacked dice.  3 oz.........Deck of cards.  1 tsp........Tip of little finger.  1 tbs........Thumb.  2 tbs........Golf ball.   cup.......Half of a fist.  1 cup........A fist. SAMPLE DIABETES MEAL PLAN Below is a sample meal plan that includes foods from the grain and starches, dairy, vegetable, fruit, and meat groups. A dietician can individualize a meal plan to fit your calorie needs and tell you the number of servings needed from each food group. However, controlling the total amount of carbohydrates in your meal or snack is more important than making sure you   include all of the food groups at every meal. You may interchange carbohydrate containing foods (dairy, starches, and fruits). The meal plan below is an example of a 2000 calorie diet using carbohydrate counting. This meal plan has 17 carbohydrate servings. Breakfast  1 cup oatmeal (2 carb servings).   cup light yogurt (1 carb serving).  1 cup blueberries (1 carb serving).   cup almonds. Snack  1 large apple (2 carb servings).  1 low-fat string cheese stick. Lunch  Chicken breast salad.  1 cup spinach.   cup chopped tomatoes.  2 oz chicken breast, sliced.  2 tbs low-fat Italian dressing.  12 whole-wheat crackers (2 carb servings).  12 to 15 grapes (1 carb serving).  1 cup low-fat milk (1 carb  serving). Snack  1 cup carrots.   cup hummus (1 carb serving). Dinner  3 oz broiled salmon.  1 cup brown rice (3 carb servings). Snack  1  cups steamed broccoli (1 carb serving) drizzled with 1 tsp olive oil and lemon juice.  1 cup light pudding (2 carb servings). DIABETES MEAL PLANNING WORKSHEET Your dietician can use this worksheet to help you decide how many servings of foods and what types of foods are right for you.  BREAKFAST Food Group and Servings / Carb Servings Grain/Starches __________________________________ Dairy __________________________________________ Vegetable ______________________________________ Fruit ___________________________________________ Meat __________________________________________ Fat ____________________________________________ LUNCH Food Group and Servings / Carb Servings Grain/Starches ___________________________________ Dairy ___________________________________________ Fruit ____________________________________________ Meat ___________________________________________ Fat _____________________________________________ DINNER Food Group and Servings / Carb Servings Grain/Starches ___________________________________ Dairy ___________________________________________ Fruit ____________________________________________ Meat ___________________________________________ Fat _____________________________________________ SNACKS Food Group and Servings / Carb Servings Grain/Starches ___________________________________ Dairy ___________________________________________ Vegetable _______________________________________ Fruit ____________________________________________ Meat ___________________________________________ Fat _____________________________________________ DAILY TOTALS Starches _________________________ Vegetable ________________________ Fruit ____________________________ Dairy ____________________________ Meat  ____________________________ Fat ______________________________ Document Released: 07/10/2005 Document Revised: 01/05/2012 Document Reviewed: 05/21/2009 ExitCare Patient Information 2014 [employer].  

## 2014-01-30 NOTE — Progress Notes (Signed)
Patient here for follow up Has history of BV  and pre- diabetes

## 2014-02-08 ENCOUNTER — Other Ambulatory Visit: Payer: Self-pay | Admitting: Internal Medicine

## 2014-03-21 ENCOUNTER — Ambulatory Visit: Payer: No Typology Code available for payment source | Attending: Internal Medicine | Admitting: Internal Medicine

## 2014-03-21 ENCOUNTER — Other Ambulatory Visit (HOSPITAL_COMMUNITY)
Admission: RE | Admit: 2014-03-21 | Discharge: 2014-03-21 | Disposition: A | Discharge status: Home / Self Care | Payer: No Typology Code available for payment source | Source: Ambulatory Visit | Attending: Internal Medicine | Admitting: Internal Medicine

## 2014-03-21 ENCOUNTER — Encounter: Payer: Self-pay | Admitting: Internal Medicine

## 2014-03-21 VITALS — BP 136/89 | HR 81 | Temp 98.6°F

## 2014-03-21 DIAGNOSIS — N760 Acute vaginitis: Secondary | ICD-10-CM | POA: Insufficient documentation

## 2014-03-21 DIAGNOSIS — F17200 Nicotine dependence, unspecified, uncomplicated: Secondary | ICD-10-CM | POA: Insufficient documentation

## 2014-03-21 DIAGNOSIS — I10 Essential (primary) hypertension: Secondary | ICD-10-CM | POA: Insufficient documentation

## 2014-03-21 DIAGNOSIS — Z113 Encounter for screening for infections with a predominantly sexual mode of transmission: Secondary | ICD-10-CM | POA: Insufficient documentation

## 2014-03-21 DIAGNOSIS — N898 Other specified noninflammatory disorders of vagina: Secondary | ICD-10-CM

## 2014-03-21 MED ORDER — HYDROCHLOROTHIAZIDE 12.5 MG PO TABS: 12.5000 mg | ORAL_TABLET | Freq: Every day | ORAL | Status: DC

## 2014-03-21 NOTE — Progress Notes (Signed)
Patient here today for vaginal discharge for one week. Patient states the discharge is white in color, no odor or itching, but has abdominal cramping. Alverda Skeans, RN

## 2014-03-21 NOTE — Patient Instructions (Signed)

## 2014-03-21 NOTE — Progress Notes (Signed)
Patient ID: Mallory Wall, female   DOB: 02-28-1961, 53 y.o.   MRN: [id number]  CC: vaginal discharge  HPI: Patient patient reports white thick vaginal discharge since last week. Patient denies odor or lesions. Patient reports mild itching.  Patient reports that she was treated last month for bacterial vaginosis and believes it has returned.    No Known Allergies Past Medical History  Diagnosis Date  . Headache(784.0)   . Allergy    Current Outpatient Prescriptions on File Prior to Visit  Medication Sig Dispense Refill  . acetaminophen (TYLENOL) 325 MG tablet Take 2 tablets (650 mg total) by mouth every 6 (six) hours as needed for pain.      Marland Kitchen atorvastatin (LIPITOR) 20 MG tablet Take 1 tablet (20 mg total) by mouth daily.  90 tablet  3  . diphenhydramine-acetaminophen (TYLENOL PM) 25-500 MG TABS Take 2 tablets by mouth at bedtime as needed (for sleep).  14 tablet    . fluconazole (DIFLUCAN) 150 MG tablet Take 1 tablet (150 mg total) by mouth once. Repeat if needed  1 tablet  1  . metroNIDAZOLE (FLAGYL) 500 MG tablet Take 1 tablet (500 mg total) by mouth 3 (three) times daily.  21 tablet  0   No current facility-administered medications on file prior to visit.   Family History  Problem Relation Age of Onset  . CAD Mother   . Hypertension Mother   . Diabetes type II Sister    History   Social History  . Marital Status: Single    Spouse Name: N/A    Number of Children: N/A  . Years of Education: N/A   Occupational History  . Not on file.   Social History Main Topics  . Smoking status: Current Every Day Smoker -- 1.00 packs/day  . Smokeless tobacco: Not on file  . Alcohol Use: No  . Drug Use: No  . Sexual Activity: Not Currently   Other Topics Concern  . Not on file   Social History Narrative   Works at [employer]  Constitutional: Negative for fever and chills.  Genitourinary: Negative for dysuria, urgency, frequency, hematuria and flank  pain.       Thick, white, vaginal discharge   Neurological: Positive for headaches.     Objective:   Filed Vitals:   03/21/14 1527  BP: 136/89  Pulse: 81  Temp: 98.6 F (37 C)   Physical Exam  Cardiovascular: Normal rate, regular rhythm and normal heart sounds.   Pulmonary/Chest: Effort normal and breath sounds normal.  Abdominal: Soft. Bowel sounds are normal. She exhibits no distension. There is no tenderness.  Genitourinary: Uterus normal. Cervix exhibits no motion tenderness, no discharge and no friability. Right adnexum displays mass. Right adnexum displays no tenderness. Left adnexum displays no mass and no tenderness. Vaginal discharge (white) found.  Lymphadenopathy:       Right: No inguinal adenopathy present.       Left: No inguinal adenopathy present.  Skin: Skin is warm and dry.     Lab Results  Component Value Date   WBC 6.5 02/13/2013   HGB 10.9* 02/13/2013   HCT 31.9* 02/13/2013   MCV 86.0 02/13/2013   PLT 245 02/13/2013   Lab Results  Component Value Date   CREATININE 0.59 01/30/2014   BUN 9 01/30/2014   NA 142 01/30/2014   K 3.8 01/30/2014   CL 105 01/30/2014   CO2 27 01/30/2014    Lab Results  Component  Value Date   HGBA1C 6.4* 09/19/2013   Lipid Panel     Component Value Date/Time   CHOL 201* 01/30/2014 1211   TRIG 62 01/30/2014 1211   HDL 49 01/30/2014 1211   CHOLHDL 4.1 01/30/2014 1211   VLDL 12 01/30/2014 1211   LDLCALC 140* 01/30/2014 1211       Assessment and plan:   Mallory Wall was seen today for vaginal discharge.  Diagnoses and associated orders for this visit:  Vaginal discharge - Cervicovaginal ancillary only Proper hygiene discussed.  HTN (hypertension) - hydrochlorothiazide (HYDRODIURIL) 12.5 MG tablet; Take 1 tablet (12.5 mg total) by mouth daily.   Return in about 1 week (around 03/28/2014) for Nurse Visit-BP check, 3 months with PCP.      Mallory Wall, [facility] [phone] 03/21/2014, 3:40 PM

## 2014-03-24 ENCOUNTER — Telehealth: Payer: Self-pay

## 2014-03-24 MED ORDER — METRONIDAZOLE 500 MG PO TABS: 500.0000 mg | ORAL_TABLET | Freq: Two times a day (BID) | ORAL | Status: DC

## 2014-03-24 NOTE — Telephone Encounter (Signed)
Patient is aware of her pap results [facility]

## 2014-03-24 NOTE — Telephone Encounter (Signed)
Message copied by Dorothe Pea on Fri Mar 24, 2014  9:48 AM ------      Message from: Chari Manning A      Created: Thu Mar 23, 2014  9:42 PM       Let patient know STD are negative but positive for bacterial vaginosis. Explain this is not a STD. May send patient Rx of Metronidazole 500 mg BID for 7 days. Explain no alcohol intake while on this medication. ------

## 2014-03-28 ENCOUNTER — Ambulatory Visit: Payer: No Typology Code available for payment source | Attending: Internal Medicine | Admitting: *Deleted

## 2014-03-28 VITALS — BP 129/89 | HR 89 | Resp 16

## 2014-03-28 DIAGNOSIS — I10 Essential (primary) hypertension: Secondary | ICD-10-CM

## 2014-03-28 NOTE — Patient Instructions (Signed)

## 2014-03-28 NOTE — Progress Notes (Signed)
Patient here for a Blood Pressure Check.

## 2014-04-12 ENCOUNTER — Ambulatory Visit: Payer: Self-pay | Attending: Internal Medicine

## 2014-06-21 ENCOUNTER — Encounter: Payer: Self-pay | Admitting: Internal Medicine

## 2014-06-21 ENCOUNTER — Ambulatory Visit: Payer: Self-pay | Attending: Internal Medicine | Admitting: Internal Medicine

## 2014-06-21 VITALS — BP 126/98 | HR 73 | Temp 98.1°F | Resp 16 | Ht 62.0 in | Wt 156.8 lb

## 2014-06-21 DIAGNOSIS — R51 Headache: Secondary | ICD-10-CM | POA: Insufficient documentation

## 2014-06-21 DIAGNOSIS — N949 Unspecified condition associated with female genital organs and menstrual cycle: Secondary | ICD-10-CM | POA: Insufficient documentation

## 2014-06-21 DIAGNOSIS — Z8249 Family history of ischemic heart disease and other diseases of the circulatory system: Secondary | ICD-10-CM | POA: Insufficient documentation

## 2014-06-21 DIAGNOSIS — F17200 Nicotine dependence, unspecified, uncomplicated: Secondary | ICD-10-CM | POA: Insufficient documentation

## 2014-06-21 DIAGNOSIS — N898 Other specified noninflammatory disorders of vagina: Secondary | ICD-10-CM

## 2014-06-21 DIAGNOSIS — I10 Essential (primary) hypertension: Secondary | ICD-10-CM

## 2014-06-21 DIAGNOSIS — E785 Hyperlipidemia, unspecified: Secondary | ICD-10-CM | POA: Insufficient documentation

## 2014-06-21 MED ORDER — HYDROCHLOROTHIAZIDE 12.5 MG PO TABS: 12.5000 mg | ORAL_TABLET | Freq: Every day | ORAL | Status: DC

## 2014-06-21 NOTE — Progress Notes (Signed)
F/H HTN stated no problems, was elevated a couple of days ago due to stress

## 2014-06-21 NOTE — Progress Notes (Signed)
Patient ID: Mallory Wall, female   DOB: Mar 10, 1961, 53 y.o.   MRN: [id number]  CC:  HTN follow up  HPI:  Patient takes BP at home and they usually range in 110-117.  She reports that she has been under a lot of stress and has seen that her BP elevated to the 130's.  She states that she ran out of her BP medication last month and did not have money to come back for a office visit.  She denies SOB, chest pain, orthopnea, claudication, leg swelling, and headaches.  She reports having pelvic pain and discharge (white, thin) for past 2 weeks similar to when she had bacterial vaginosis.  Patient denies unprotected sex or dysuria.    No Known Allergies Past Medical History  Diagnosis Date  . Headache(784.0)   . Allergy   . Hypertension   . Hyperlipidemia    Current Outpatient Prescriptions on File Prior to Visit  Medication Sig Dispense Refill  . acetaminophen (TYLENOL) 325 MG tablet Take 2 tablets (650 mg total) by mouth every 6 (six) hours as needed for pain.      Marland Kitchen atorvastatin (LIPITOR) 20 MG tablet Take 1 tablet (20 mg total) by mouth daily.  90 tablet  3  . diphenhydramine-acetaminophen (TYLENOL PM) 25-500 MG TABS Take 2 tablets by mouth at bedtime as needed (for sleep).  14 tablet    . hydrochlorothiazide (HYDRODIURIL) 12.5 MG tablet Take 1 tablet (12.5 mg total) by mouth daily.  30 tablet  2  . fluconazole (DIFLUCAN) 150 MG tablet Take 1 tablet (150 mg total) by mouth once. Repeat if needed  1 tablet  1  . metroNIDAZOLE (FLAGYL) 500 MG tablet Take 1 tablet (500 mg total) by mouth 3 (three) times daily.  21 tablet  0  . metroNIDAZOLE (FLAGYL) 500 MG tablet Take 1 tablet (500 mg total) by mouth 2 (two) times daily.  14 tablet  0   No current facility-administered medications on file prior to visit.   Family History  Problem Relation Age of Onset  . CAD Mother   . Hypertension Mother   . Diabetes type II Sister    History   Social History  . Marital Status: Single    Spouse Name:  N/A    Number of Children: N/A  . Years of Education: N/A   Occupational History  . Not on file.   Social History Main Topics  . Smoking status: Current Every Day Smoker -- 1.00 packs/day  . Smokeless tobacco: Not on file  . Alcohol Use: No  . Drug Use: No  . Sexual Activity: Not Currently   Other Topics Concern  . Not on file   Social History Narrative   Works at [employer]: See HI   Objective:   Filed Vitals:   06/21/14 1103  BP: 126/98  Pulse: 73  Temp: 98.1 F (36.7 C)  Resp: 16   Physical Exam  Constitutional: She is oriented to person, place, and time.  Cardiovascular: Normal rate, regular rhythm and normal heart sounds.   Pulmonary/Chest: Effort normal and breath sounds normal.  Abdominal: Soft. Bowel sounds are normal.  Musculoskeletal: Normal range of motion. She exhibits no edema and no tenderness.  Neurological: She is alert and oriented to person, place, and time.  Skin: Skin is warm and dry.  Psychiatric: She has a normal mood and affect.     Lab Results  Component Value Date   WBC 6.5 02/13/2013  HGB 10.9* 02/13/2013   HCT 31.9* 02/13/2013   MCV 86.0 02/13/2013   PLT 245 02/13/2013   Lab Results  Component Value Date   CREATININE 0.59 01/30/2014   BUN 9 01/30/2014   NA 142 01/30/2014   K 3.8 01/30/2014   CL 105 01/30/2014   CO2 27 01/30/2014    Lab Results  Component Value Date   HGBA1C 6.4* 09/19/2013   Lipid Panel     Component Value Date/Time   CHOL 201* 01/30/2014 1211   TRIG 62 01/30/2014 1211   HDL 49 01/30/2014 1211   CHOLHDL 4.1 01/30/2014 1211   VLDL 12 01/30/2014 1211   LDLCALC 140* 01/30/2014 1211       Assessment and plan:   Mallory Wall was seen today for follow-up.  Diagnoses and associated orders for this visit:  Essential hypertension - Refilled hydrochlorothiazide (HYDRODIURIL) 12.5 MG tablet; Take 1 tablet (12.5 mg total) by mouth daily.  Vaginal discharge - Cervicovaginal ancillary only. Patient  refused STD testing   Return in about 6 months (around 12/22/2014) for Hypertension.        Mallory Wall, [facility] [phone] 06/21/2014, 11:39 AM

## 2014-06-23 ENCOUNTER — Telehealth: Payer: Self-pay | Admitting: Emergency Medicine

## 2014-06-23 MED ORDER — METRONIDAZOLE 0.75 % VA GEL: 1.0000 | Freq: Every day | VAGINAL | Status: DC

## 2014-06-23 NOTE — Telephone Encounter (Signed)
Pt given pap smear results with medication instructions to start taking prescribed Metrogel one applicator [facility] x 7 dys Script sent to [facility] e-scribed

## 2014-06-23 NOTE — Telephone Encounter (Signed)
Message copied by Ricci Barker on Fri Jun 23, 2014 11:39 AM ------      Message from: Chari Manning A      Created: Thu Jun 22, 2014 11:13 PM       Patient positive for BV. Please send Metrogel one applicator full daily at [facility] for seven days. Thanks ------

## 2014-11-06 ENCOUNTER — Ambulatory Visit: Payer: Self-pay | Admitting: Internal Medicine

## 2014-11-10 ENCOUNTER — Other Ambulatory Visit: Payer: Self-pay | Admitting: Internal Medicine

## 2014-11-10 DIAGNOSIS — Z1231 Encounter for screening mammogram for malignant neoplasm of breast: Secondary | ICD-10-CM

## 2014-11-13 ENCOUNTER — Ambulatory Visit: Payer: Self-pay | Attending: Internal Medicine

## 2014-11-13 ENCOUNTER — Ambulatory Visit (HOSPITAL_BASED_OUTPATIENT_CLINIC_OR_DEPARTMENT_OTHER): Payer: Self-pay | Admitting: Internal Medicine

## 2014-11-13 ENCOUNTER — Encounter: Payer: Self-pay | Admitting: Internal Medicine

## 2014-11-13 VITALS — BP 102/71 | HR 77 | Temp 98.6°F | Resp 16 | Ht 65.0 in | Wt 204.0 lb

## 2014-11-13 DIAGNOSIS — N898 Other specified noninflammatory disorders of vagina: Secondary | ICD-10-CM

## 2014-11-13 DIAGNOSIS — Z79899 Other long term (current) drug therapy: Secondary | ICD-10-CM | POA: Insufficient documentation

## 2014-11-13 DIAGNOSIS — M545 Low back pain: Secondary | ICD-10-CM | POA: Insufficient documentation

## 2014-11-13 DIAGNOSIS — I10 Essential (primary) hypertension: Secondary | ICD-10-CM

## 2014-11-13 DIAGNOSIS — R202 Paresthesia of skin: Secondary | ICD-10-CM

## 2014-11-13 DIAGNOSIS — F17210 Nicotine dependence, cigarettes, uncomplicated: Secondary | ICD-10-CM | POA: Insufficient documentation

## 2014-11-13 DIAGNOSIS — R109 Unspecified abdominal pain: Secondary | ICD-10-CM | POA: Insufficient documentation

## 2014-11-13 DIAGNOSIS — E785 Hyperlipidemia, unspecified: Secondary | ICD-10-CM | POA: Insufficient documentation

## 2014-11-13 LAB — POCT URINALYSIS DIPSTICK
Blood, UA: NEGATIVE
Glucose, UA: NEGATIVE
Ketones, UA: 15
LEUKOCYTES UA: NEGATIVE
NITRITE UA: NEGATIVE
PH UA: 5.5
PROTEIN UA: 100
Spec Grav, UA: >=1.03
UROBILINOGEN UA: 1

## 2014-11-13 NOTE — Progress Notes (Signed)
Patient ID: Mallory Wall, female   DOB: 05-03-1961, 54 y.o.   MRN: [id number]  CC: abdominal pain  HPI: Mallory Wall is a 54 y.o. female here today for a follow up visit.  Patient has past medical history of hypertension and hyperlipidemia.  She states that for the past 3 weeks she has been having a pain in her lower abdomen that moves from side to side.  She has noticed some lower back pain that feels achy and painful.  She notes some thin white vaginal discharge that has some associated odor. No new sexual partners or unprotected sex. Denies nausea, vomiting, diarrhea, acid reflux.  She has had some chills.  No Known Allergies Past Medical History  Diagnosis Date  . Headache(784.0)   . Allergy   . Hypertension   . Hyperlipidemia    Current Outpatient Prescriptions on File Prior to Visit  Medication Sig Dispense Refill  . acetaminophen (TYLENOL) 325 MG tablet Take 2 tablets (650 mg total) by mouth every 6 (six) hours as needed for pain.    Marland Kitchen atorvastatin (LIPITOR) 20 MG tablet Take 1 tablet (20 mg total) by mouth daily. 90 tablet 3  . diphenhydramine-acetaminophen (TYLENOL PM) 25-500 MG TABS Take 2 tablets by mouth at bedtime as needed (for sleep). 14 tablet   . fluconazole (DIFLUCAN) 150 MG tablet Take 1 tablet (150 mg total) by mouth once. Repeat if needed 1 tablet 1  . hydrochlorothiazide (HYDRODIURIL) 12.5 MG tablet Take 1 tablet (12.5 mg total) by mouth daily. 30 tablet 4  . metroNIDAZOLE (FLAGYL) 500 MG tablet Take 1 tablet (500 mg total) by mouth 3 (three) times daily. 21 tablet 0  . metroNIDAZOLE (FLAGYL) 500 MG tablet Take 1 tablet (500 mg total) by mouth 2 (two) times daily. 14 tablet 0  . metroNIDAZOLE (METROGEL) 0.75 % vaginal gel Place 1 Applicatorful vaginally at bedtime. Use one applicator at bedtime for seven days 70 g 0   No current facility-administered medications on file prior to visit.   Family History  Problem Relation Age of Onset  . CAD Mother   .  Hypertension Mother   . Diabetes type II Sister    History   Social History  . Marital Status: Single    Spouse Name: N/A    Number of Children: N/A  . Years of Education: N/A   Occupational History  . Not on file.   Social History Main Topics  . Smoking status: Current Every Day Smoker -- 1.00 packs/day  . Smokeless tobacco: Not on file  . Alcohol Use: No  . Drug Use: No  . Sexual Activity: Not Currently   Other Topics Concern  . Not on file   Social History Narrative   Works at [employer]: See hpi   Objective:   Filed Vitals:   11/13/14 1721  BP: 102/71  Pulse: 77  Temp: 98.6 F (37 C)  Resp: 16    Physical Exam  Constitutional: She is oriented to person, place, and time.  Cardiovascular: Normal rate, regular rhythm and normal heart sounds.   Pulmonary/Chest: Effort normal and breath sounds normal.  Abdominal: Soft. She exhibits no distension. There is no tenderness.  Genitourinary: Uterus normal. Cervix exhibits no motion tenderness, no discharge and no friability. Right adnexum displays no tenderness. Left adnexum displays no tenderness. Vaginal discharge found.  Lymphadenopathy:       Right: No inguinal adenopathy present.       Left: No inguinal  adenopathy present.  Neurological: She is alert and oriented to person, place, and time.     Lab Results  Component Value Date   WBC 6.5 02/13/2013   HGB 10.9* 02/13/2013   HCT 31.9* 02/13/2013   MCV 86.0 02/13/2013   PLT 245 02/13/2013   Lab Results  Component Value Date   CREATININE 0.59 01/30/2014   BUN 9 01/30/2014   NA 142 01/30/2014   K 3.8 01/30/2014   CL 105 01/30/2014   CO2 27 01/30/2014    Lab Results  Component Value Date   HGBA1C 6.4* 09/19/2013   Lipid Panel     Component Value Date/Time   CHOL 201* 01/30/2014 1211   TRIG 62 01/30/2014 1211   HDL 49 01/30/2014 1211   CHOLHDL 4.1 01/30/2014 1211   VLDL 12 01/30/2014 1211   LDLCALC 140*  01/30/2014 1211       Assessment and plan:   Mallory Wall was seen today for follow-up and abdominal pain.  Diagnoses and associated orders for this visit:  Low back pain without sciatica, unspecified back pain laterality - Urinalysis Dipstick  Abdominal pain, unspecified abdominal location Likely due to BV  Vaginal discharge - Cervicovaginal ancillary only. If positive for BV I will send medication  Essential hypertension - Lipid panel; Future - COMPLETE METABOLIC PANEL WITH GFR; Future  Paresthesia of both feet - Vitamin B12; Future - CBC; Future - Hemoglobin A1C; Future    Return in about 1 week (around 11/20/2014) for Lab Visit.       Chari Manning, NP-C [facility] [phone] 11/13/2014, 5:22 PM

## 2014-11-13 NOTE — Patient Instructions (Signed)
Bacterial Vaginosis Bacterial vaginosis is a vaginal infection that occurs when the normal balance of bacteria in the vagina is disrupted. It results from an overgrowth of certain bacteria. This is the most common vaginal infection in women of childbearing age. Treatment is important to prevent complications, especially in pregnant women, as it can cause a premature delivery. CAUSES  Bacterial vaginosis is caused by an increase in harmful bacteria that are normally present in smaller amounts in the vagina. Several different kinds of bacteria can cause bacterial vaginosis. However, the reason that the condition develops is not fully understood. RISK FACTORS Certain activities or behaviors can put you at an increased risk of developing bacterial vaginosis, including:  Having a new sex partner or multiple sex partners.  Douching.  Using an intrauterine device (IUD) for contraception. Women do not get bacterial vaginosis from toilet seats, bedding, swimming pools, or contact with objects around them. SIGNS AND SYMPTOMS  Some women with bacterial vaginosis have no signs or symptoms. Common symptoms include:  Grey vaginal discharge.  A fishlike odor with discharge, especially after sexual intercourse.  Itching or burning of the vagina and vulva.  Burning or pain with urination. DIAGNOSIS  Your health care provider will take a medical history and examine the vagina for signs of bacterial vaginosis. A sample of vaginal fluid may be taken. Your health care provider will look at this sample under a microscope to check for bacteria and abnormal cells. A vaginal pH test may also be done.  TREATMENT  Bacterial vaginosis may be treated with antibiotic medicines. These may be given in the form of a pill or a vaginal cream. A second round of antibiotics may be prescribed if the condition comes back after treatment.  HOME CARE INSTRUCTIONS   Only take over-the-counter or prescription medicines as  directed by your health care provider.  If antibiotic medicine was prescribed, take it as directed. Make sure you finish it even if you start to feel better.  Do not have sex until treatment is completed.  Tell all sexual partners that you have a vaginal infection. They should see their health care provider and be treated if they have problems, such as a mild rash or itching.  Practice safe sex by using condoms and only having one sex partner. SEEK MEDICAL CARE IF:   Your symptoms are not improving after 3 days of treatment.  You have increased discharge or pain.  You have a fever. MAKE SURE YOU:   Understand these instructions.  Will watch your condition.  Will get help right away if you are not doing well or get worse. FOR MORE INFORMATION  Centers for Disease Control and Prevention, Division of STD Prevention: www.cdc.gov/std American Sexual Health Association (ASHA): www.ashastd.org  Document Released: 10/13/2005 Document Revised: 08/03/2013 Document Reviewed: 05/25/2013 ExitCare Patient Information 2015 [employer]. This information is not intended to replace advice given to you by your health care provider. Make sure you discuss any questions you have with your health care provider.  

## 2014-11-13 NOTE — Progress Notes (Signed)
Pt comes in with c/o lower abdominal pain radiating from R to L x 3 weeks with clear vaginal d/c,slight odor Pt is sexually active- requesting STD testing Fever,chills noted Set up for Pap Urine sample negative

## 2014-11-14 ENCOUNTER — Other Ambulatory Visit: Payer: Self-pay | Admitting: Internal Medicine

## 2014-11-14 ENCOUNTER — Telehealth: Payer: Self-pay | Admitting: Internal Medicine

## 2014-11-14 DIAGNOSIS — I10 Essential (primary) hypertension: Secondary | ICD-10-CM

## 2014-11-14 MED ORDER — HYDROCHLOROTHIAZIDE 12.5 MG PO TABS: 12.5000 mg | ORAL_TABLET | Freq: Every day | ORAL | Status: DC

## 2014-11-14 MED ORDER — ATORVASTATIN CALCIUM 20 MG PO TABS: 20.0000 mg | ORAL_TABLET | Freq: Every day | ORAL | Status: DC

## 2014-11-14 NOTE — Telephone Encounter (Signed)
Patient called stating that her medications were not sent to her pharmacy from her office visit on 11/13/2014. Please f/u with pt.

## 2014-11-15 LAB — CERVICOVAGINAL ANCILLARY ONLY
Chlamydia: NEGATIVE
Neisseria Gonorrhea: NEGATIVE
WET PREP (BD AFFIRM): POSITIVE — AB
Wet Prep (BD Affirm): NEGATIVE
Wet Prep (BD Affirm): NEGATIVE

## 2014-11-16 ENCOUNTER — Telehealth: Payer: Self-pay | Admitting: *Deleted

## 2014-11-16 MED ORDER — METRONIDAZOLE 500 MG PO TABS
500.0000 mg | ORAL_TABLET | Freq: Two times a day (BID) | ORAL | Status: DC
Start: 2014-11-16 — End: 2015-06-11

## 2014-11-16 NOTE — Telephone Encounter (Signed)
-----   Message from Lance Bosch, NP sent at 11/15/2014 10:51 AM EST ----- Patient positive for Bacterial Vaginosis . Please explain this is not a STD, but a imbalance of the vaginal pH. Please send Flagyl 500 mg BID for 7 days. No refills, no alcohol while on this medication.

## 2014-11-16 NOTE — Telephone Encounter (Signed)
Rx Flagyl send to [facility] pharmacy Pt aware of lab results

## 2014-11-16 NOTE — Telephone Encounter (Signed)
-----   Message from Lance Bosch, NP sent at 11/15/2014  8:33 PM EST ----- Negative GC/Chlamydia. Please make sure you give her affirm results as well if no already. Thanks

## 2014-11-20 ENCOUNTER — Other Ambulatory Visit: Payer: Self-pay

## 2014-11-24 ENCOUNTER — Ambulatory Visit: Payer: Self-pay | Attending: Internal Medicine

## 2014-11-24 DIAGNOSIS — I10 Essential (primary) hypertension: Secondary | ICD-10-CM

## 2014-11-24 DIAGNOSIS — R202 Paresthesia of skin: Secondary | ICD-10-CM

## 2014-11-24 LAB — COMPLETE METABOLIC PANEL WITH GFR
ALT: 9 U/L (ref 0–35)
AST: 12 U/L (ref 0–37)
Albumin: 3.7 g/dL (ref 3.5–5.2)
Alkaline Phosphatase: 66 U/L (ref 39–117)
BUN: 9 mg/dL (ref 6–23)
CHLORIDE: 104 meq/L (ref 96–112)
CO2: 31 meq/L (ref 19–32)
Calcium: 9.6 mg/dL (ref 8.4–10.5)
Creat: 0.6 mg/dL (ref 0.50–1.10)
GFR, Est African American: >89 mL/min
GFR, Est Non African American: >89 mL/min
Glucose, Bld: 125 mg/dL — ABNORMAL HIGH (ref 70–99)
Potassium: 4.7 mEq/L (ref 3.5–5.3)
Sodium: 145 mEq/L (ref 135–145)
TOTAL PROTEIN: 7 g/dL (ref 6.0–8.3)
Total Bilirubin: 0.3 mg/dL (ref 0.2–1.2)

## 2014-11-24 LAB — LIPID PANEL
CHOL/HDL RATIO: 3.7 ratio
Cholesterol: 169 mg/dL (ref 0–200)
HDL: 46 mg/dL (ref >39)
LDL Cholesterol: 110 mg/dL — ABNORMAL HIGH (ref 0–99)
Triglycerides: 64 mg/dL (ref <150)
VLDL: 13 mg/dL (ref 0–40)

## 2014-11-25 LAB — CBC
HEMATOCRIT: 39.2 % (ref 36.0–46.0)
Hemoglobin: 13 g/dL (ref 12.0–15.0)
MCH: 30.3 pg (ref 26.0–34.0)
MCHC: 33.2 g/dL (ref 30.0–36.0)
MCV: 91.4 fL (ref 78.0–100.0)
MPV: 10.3 fL (ref 8.6–12.4)
Platelets: 352 10*3/uL (ref 150–400)
RBC: 4.29 MIL/uL (ref 3.87–5.11)
RDW: 14.8 % (ref 11.5–15.5)
WBC: 5.9 10*3/uL (ref 4.0–10.5)

## 2014-11-25 LAB — HEMOGLOBIN A1C
Hgb A1c MFr Bld: 6.5 % — ABNORMAL HIGH (ref <5.7)
MEAN PLASMA GLUCOSE: 140 mg/dL — AB (ref <117)

## 2014-11-25 LAB — VITAMIN B12: Vitamin B-12: 382 pg/mL (ref 211–911)

## 2014-11-29 ENCOUNTER — Telehealth: Payer: Self-pay | Admitting: *Deleted

## 2014-11-29 NOTE — Telephone Encounter (Signed)
Did she make a appointment or does she just want to make lifestyle changes

## 2014-11-29 NOTE — Telephone Encounter (Signed)
-----  Message from Lance Bosch, NP sent at 11/28/2014  9:14 PM EST ----- Cholesterol has improved but is still slightly elevated. Please make sure she is taking medication daily and has cut back on fried, fatty, starchy foods. If has followed recommendations then she may need increase of medication. Please let me know so I may make changes as necessary.  She has met the criteria for diabetes diagnoses. I am willing to let her attempt to lower her numbers with diet and exercise but if she has no improvement in numbers in 3 months then we will need to begin Metformin. This diagnosis is the likely reason she has been having tingling in her feet. If she needs more information please have her to make a appointment for further discussion and management

## 2014-11-29 NOTE — Telephone Encounter (Signed)
Left voice message to return call 

## 2014-11-29 NOTE — Telephone Encounter (Signed)
Pt aware of results Stated taking cholesterol medication daily

## 2014-11-30 ENCOUNTER — Ambulatory Visit (HOSPITAL_COMMUNITY)
Admission: RE | Admit: 2014-11-30 | Discharge: 2014-11-30 | Disposition: A | Discharge status: Home / Self Care | Payer: Self-pay | Source: Ambulatory Visit | Attending: Internal Medicine | Admitting: Internal Medicine

## 2014-11-30 DIAGNOSIS — Z1231 Encounter for screening mammogram for malignant neoplasm of breast: Secondary | ICD-10-CM

## 2014-11-30 NOTE — Telephone Encounter (Signed)
Pt was advised to make F/U appointment  Transfer to front office to schedule appointment

## 2014-12-05 ENCOUNTER — Telehealth: Payer: Self-pay | Admitting: *Deleted

## 2014-12-05 NOTE — Telephone Encounter (Signed)
Left message with normal exam If any question return call

## 2014-12-05 NOTE — Telephone Encounter (Signed)
-----   Message from Lance Bosch, NP sent at 12/05/2014 12:27 AM EST ----- No evidence of malignancy on mammogram. Repeat in one year

## 2014-12-06 ENCOUNTER — Encounter: Payer: Self-pay | Admitting: Internal Medicine

## 2014-12-06 ENCOUNTER — Ambulatory Visit: Payer: Self-pay | Attending: Internal Medicine | Admitting: Internal Medicine

## 2014-12-06 VITALS — BP 119/84 | HR 88 | Temp 98.3°F | Resp 17 | Ht 62.0 in | Wt 147.0 lb

## 2014-12-06 DIAGNOSIS — F17200 Nicotine dependence, unspecified, uncomplicated: Secondary | ICD-10-CM | POA: Insufficient documentation

## 2014-12-06 DIAGNOSIS — N760 Acute vaginitis: Secondary | ICD-10-CM | POA: Insufficient documentation

## 2014-12-06 DIAGNOSIS — Z833 Family history of diabetes mellitus: Secondary | ICD-10-CM | POA: Insufficient documentation

## 2014-12-06 DIAGNOSIS — B9689 Other specified bacterial agents as the cause of diseases classified elsewhere: Secondary | ICD-10-CM

## 2014-12-06 DIAGNOSIS — I10 Essential (primary) hypertension: Secondary | ICD-10-CM | POA: Insufficient documentation

## 2014-12-06 DIAGNOSIS — A499 Bacterial infection, unspecified: Secondary | ICD-10-CM

## 2014-12-06 DIAGNOSIS — R7309 Other abnormal glucose: Secondary | ICD-10-CM | POA: Insufficient documentation

## 2014-12-06 DIAGNOSIS — E785 Hyperlipidemia, unspecified: Secondary | ICD-10-CM | POA: Insufficient documentation

## 2014-12-06 DIAGNOSIS — R7303 Prediabetes: Secondary | ICD-10-CM

## 2014-12-06 DIAGNOSIS — G629 Polyneuropathy, unspecified: Secondary | ICD-10-CM | POA: Insufficient documentation

## 2014-12-06 LAB — GLUCOSE, POCT (MANUAL RESULT ENTRY): POC GLUCOSE: 121 mg/dL — AB (ref 70–99)

## 2014-12-06 MED ORDER — METRONIDAZOLE 0.75 % VA GEL: 1.0000 | Freq: Two times a day (BID) | VAGINAL | Status: DC

## 2014-12-06 NOTE — Progress Notes (Signed)
Patient ID: Mallory Wall, female   DOB: [birth year], 54 y.o.   MRN: [id number]  CC: counseling  HPI: Mallory Wall is a 54 y.o. female here today for a follow up visit.  Patient has past medical history of HLD and HTN.  [name]  She reports that she has been having numbness sensation in her feet for one week and has been concerned about why she is having that pain. She does have a positive family history of diabetes mellitus.  She is interested in what she can do to prevent the onset of diabetes. She denies polyuria, polydipsia, blurred vision, headache, or weight changes. She does endorse frequent vaginal infections.   Patient has No headache, No chest pain, No abdominal pain - No Nausea, No new weakness tingling or numbness, No Cough - SOB.  No Known Allergies Past Medical History  Diagnosis Date  . Headache(784.0)   . Allergy   . Hypertension   . Hyperlipidemia    Current Outpatient Prescriptions on File Prior to Visit  Medication Sig Dispense Refill  . atorvastatin (LIPITOR) 20 MG tablet Take 1 tablet (20 mg total) by mouth daily. 90 tablet 3  . hydrochlorothiazide (HYDRODIURIL) 12.5 MG tablet Take 1 tablet (12.5 mg total) by mouth daily. 30 tablet 4  . acetaminophen (TYLENOL) 325 MG tablet Take 2 tablets (650 mg total) by mouth every 6 (six) hours as needed for pain. (Patient not taking: Reported on 11/13/2014)    . diphenhydramine-acetaminophen (TYLENOL PM) 25-500 MG TABS Take 2 tablets by mouth at bedtime as needed (for sleep). (Patient not taking: Reported on 11/13/2014) 14 tablet   . metroNIDAZOLE (FLAGYL) 500 MG tablet Take 1 tablet (500 mg total) by mouth 2 (two) times daily. (Patient not taking: Reported on 12/06/2014) 14 tablet 0   No current facility-administered medications on file prior to visit.   Family History  Problem Relation Age of Onset  . CAD Mother   . Hypertension Mother   . Diabetes type II Sister    History   Social  History  . Marital Status: Single    Spouse Name: N/A  . Number of Children: N/A  . Years of Education: N/A   Occupational History  . Not on file.   Social History Main Topics  . Smoking status: Current Every Day Smoker -- 1.00 packs/day  . Smokeless tobacco: Not on file  . Alcohol Use: No  . Drug Use: No  . Sexual Activity: Not Currently   Other Topics Concern  . Not on file   Social History Narrative   Works at [employer]: See HPI    Objective:   Filed Vitals:   12/06/14 1153  BP: 119/84  Pulse: 88  Temp: 98.3 F (36.8 C)  Resp: 17    Physical Exam  Cardiovascular: Normal rate, regular rhythm and normal heart sounds.   Pulmonary/Chest: Effort normal and breath sounds normal.     Lab Results  Component Value Date   WBC 5.9 11/24/2014   HGB 13.0 11/24/2014   HCT 39.2 11/24/2014   MCV 91.4 11/24/2014   PLT 352 11/24/2014   Lab Results  Component Value Date   CREATININE 0.60 11/24/2014   BUN 9 11/24/2014   NA 145 11/24/2014   K 4.7 11/24/2014   CL 104 11/24/2014   CO2 31 11/24/2014    Lab Results  Component Value Date   HGBA1C 6.5* 11/24/2014   Lipid Panel  Component Value Date/Time   CHOL 169 11/24/2014 0916   TRIG 64 11/24/2014 0916   HDL 46 11/24/2014 0916   CHOLHDL 3.7 11/24/2014 0916   VLDL 13 11/24/2014 0916   LDLCALC 110* 11/24/2014 0916       Assessment and plan:   Mallory Wall was seen today for follow-up.  Diagnoses and all orders for this visit:  Prediabetes Orders: -     Glucose (CBG) Went over diet, exercise, and weight stabilization in order to slow the onset of diabetes. We will not start Metformin today but patient will make necessary lifestyle changes in order to attempt to manage the disease with diet and exercise.  BV (bacterial vaginosis) Orders: -    Refill metroNIDAZOLE (METROGEL VAGINAL) 0.75 % vaginal gel; Place 1 Applicatorful vaginally 2 (two) times daily. May be due to onset  of diabetes  Neuropathy Will see if tingling improves with lifestyle modifications   Return in about 6 months (around 06/06/2015) for A1c repeat.        Chari Manning, NP-C [facility] [phone] 12/06/2014, 12:33 PM

## 2014-12-06 NOTE — Patient Instructions (Signed)
Diabetes Mellitus and Food It is important for you to manage your blood sugar (glucose) level. Your blood glucose level can be greatly affected by what you eat. Eating healthier foods in the appropriate amounts throughout the day at about the same time each day will help you control your blood glucose level. It can also help slow or prevent worsening of your diabetes mellitus. Healthy eating may even help you improve the level of your blood pressure and reach or maintain a healthy weight.  HOW CAN FOOD AFFECT ME? Carbohydrates Carbohydrates affect your blood glucose level more than any other type of food. Your dietitian will help you determine how many carbohydrates to eat at each meal and teach you how to count carbohydrates. Counting carbohydrates is important to keep your blood glucose at a healthy level, especially if you are using insulin or taking certain medicines for diabetes mellitus. Alcohol Alcohol can cause sudden decreases in blood glucose (hypoglycemia), especially if you use insulin or take certain medicines for diabetes mellitus. Hypoglycemia can be a life-threatening condition. Symptoms of hypoglycemia (sleepiness, dizziness, and disorientation) are similar to symptoms of having too much alcohol.  If your health care provider has given you approval to drink alcohol, do so in moderation and use the following guidelines:  Women should not have more than one drink per day, and men should not have more than two drinks per day. One drink is equal to:  12 oz of beer.  5 oz of wine.  1 oz of hard liquor.  Do not drink on an empty stomach.  Keep yourself hydrated. Have water, diet soda, or unsweetened iced tea.  Regular soda, juice, and other mixers might contain a lot of carbohydrates and should be counted. WHAT FOODS ARE NOT RECOMMENDED? As you make food choices, it is important to remember that all foods are not the same. Some foods have fewer nutrients per serving than other  foods, even though they might have the same number of calories or carbohydrates. It is difficult to get your body what it needs when you eat foods with fewer nutrients. Examples of foods that you should avoid that are high in calories and carbohydrates but low in nutrients include:  Trans fats (most processed foods list trans fats on the Nutrition Facts label).  Regular soda.  Juice.  Candy.  Sweets, such as cake, pie, doughnuts, and cookies.  Fried foods. WHAT FOODS CAN I EAT? Have nutrient-rich foods, which will nourish your body and keep you healthy. The food you should eat also will depend on several factors, including:  The calories you need.  The medicines you take.  Your weight.  Your blood glucose level.  Your blood pressure level.  Your cholesterol level. You also should eat a variety of foods, including:  Protein, such as meat, poultry, fish, tofu, nuts, and seeds (lean animal proteins are best).  Fruits.  Vegetables.  Dairy products, such as milk, cheese, and yogurt (low fat is best).  Breads, grains, pasta, cereal, rice, and beans.  Fats such as olive oil, trans fat-free margarine, canola oil, avocado, and olives. DOES EVERYONE WITH DIABETES MELLITUS HAVE THE SAME MEAL PLAN? Because every person with diabetes mellitus is different, there is not one meal plan that works for everyone. It is very important that you meet with a dietitian who will help you create a meal plan that is just right for you. Document Released: 07/10/2005 Document Revised: 10/18/2013 Document Reviewed: 09/09/2013 ExitCare Patient Information 2015 [employer]. This   information is not intended to replace advice given to you by your health care provider. Make sure you discuss any questions you have with your health care provider.  

## 2014-12-06 NOTE — Progress Notes (Signed)
Pt is here following up on her HTN and hyperlipidemia. Pt was recently diagnosed with prediabetes.

## 2015-01-13 ENCOUNTER — Encounter (HOSPITAL_COMMUNITY): Payer: Self-pay | Admitting: Emergency Medicine

## 2015-01-13 ENCOUNTER — Emergency Department (HOSPITAL_COMMUNITY)
Admission: EM | Admit: 2015-01-13 | Discharge: 2015-01-13 | Disposition: A | Discharge status: Home / Self Care | Payer: Self-pay | Attending: Emergency Medicine | Admitting: Emergency Medicine

## 2015-01-13 DIAGNOSIS — Z792 Long term (current) use of antibiotics: Secondary | ICD-10-CM | POA: Insufficient documentation

## 2015-01-13 DIAGNOSIS — E785 Hyperlipidemia, unspecified: Secondary | ICD-10-CM | POA: Insufficient documentation

## 2015-01-13 DIAGNOSIS — Z720 Tobacco use: Secondary | ICD-10-CM | POA: Insufficient documentation

## 2015-01-13 DIAGNOSIS — Z79899 Other long term (current) drug therapy: Secondary | ICD-10-CM | POA: Insufficient documentation

## 2015-01-13 DIAGNOSIS — M5442 Lumbago with sciatica, left side: Secondary | ICD-10-CM | POA: Insufficient documentation

## 2015-01-13 DIAGNOSIS — I10 Essential (primary) hypertension: Secondary | ICD-10-CM | POA: Insufficient documentation

## 2015-01-13 HISTORY — DX: Dorsalgia, unspecified: M54.9

## 2015-01-13 MED ORDER — OXYCODONE-ACETAMINOPHEN 5-325 MG PO TABS
1.0000 | ORAL_TABLET | Freq: Once | ORAL | Status: AC
  Administered 2015-01-13: 1 via ORAL

## 2015-01-13 MED ORDER — HYDROCODONE-ACETAMINOPHEN 5-325 MG PO TABS: 1.0000 | ORAL_TABLET | Freq: Four times a day (QID) | ORAL | Status: DC | PRN

## 2015-01-13 MED ORDER — HYDROCODONE-ACETAMINOPHEN 5-325 MG PO TABS
2.0000 | ORAL_TABLET | Freq: Once | ORAL | Status: AC
  Administered 2015-01-13: 2 via ORAL
  Filled 2015-01-13: qty 2

## 2015-01-13 MED ORDER — METHOCARBAMOL 500 MG PO TABS: 500.0000 mg | ORAL_TABLET | Freq: Two times a day (BID) | ORAL | Status: DC

## 2015-01-13 MED ORDER — METHOCARBAMOL 500 MG PO TABS
500.0000 mg | ORAL_TABLET | Freq: Once | ORAL | Status: AC
  Administered 2015-01-13: 500 mg via ORAL
  Filled 2015-01-13: qty 1

## 2015-01-13 MED ORDER — NAPROXEN 500 MG PO TABS: 500.0000 mg | ORAL_TABLET | Freq: Two times a day (BID) | ORAL | Status: DC

## 2015-01-13 MED ORDER — OXYCODONE-ACETAMINOPHEN 5-325 MG PO TABS
ORAL_TABLET | ORAL | Status: AC
  Filled 2015-01-13: qty 1

## 2015-01-13 NOTE — Discharge Instructions (Signed)
Followup with orthopedics if symptoms continue. Use conservative methods at home including heat therapy and cold therapy as we discussed. More information on cold therapy is listed below.  It is not recommended to use heat treatment directly after an acute injury.  Do not drive or operate heavy machinery for 4-6 hours after taking muscle relaxer (Robaxin) or pain medication (Norco).  SEEK IMMEDIATE MEDICAL ATTENTION IF: New numbness, tingling, weakness, or problem with the use of your arms or legs.  Severe back pain not relieved with medications.  Change in bowel or bladder control.  Increasing pain in any areas of the body (such as chest or abdominal pain).  Shortness of breath, dizziness or fainting.  Nausea (feeling sick to your stomach), vomiting, fever, or sweats.  COLD THERAPY DIRECTIONS:  Ice or gel packs can be used to reduce both pain and swelling. Ice is the most helpful within the first 24 to 48 hours after an injury or flareup from overusing a muscle or joint.  Ice is effective, has very few side effects, and is safe for most people to use.   If you expose your skin to cold temperatures for too long or without the proper protection, you can damage your skin or nerves. Watch for signs of skin damage due to cold.   HOME CARE INSTRUCTIONS  Follow these tips to use ice and cold packs safely.  Place a dry or damp towel between the ice and skin. A damp towel will cool the skin more quickly, so you may need to shorten the time that the ice is used.  For a more rapid response, add gentle compression to the ice.  Ice for no more than 10 to 20 minutes at a time. The bonier the area you are icing, the less time it will take to get the benefits of ice.  Check your skin after 5 minutes to make sure there are no signs of a poor response to cold or skin damage.  Rest 20 minutes or more in between uses.  Once your skin is numb, you can end your treatment. You can test numbness by very lightly  touching your skin. The touch should be so light that you do not see the skin dimple from the pressure of your fingertip. When using ice, most people will feel these normal sensations in this order: cold, burning, aching, and numbness.  Do not use ice on someone who cannot communicate their responses to pain, such as small children or people with dementia.   HOW TO MAKE AN ICE PACK  To make an ice pack, do one of the following:  Place crushed ice or a bag of frozen vegetables in a sealable plastic bag. Squeeze out the excess air. Place this bag inside another plastic bag. Slide the bag into a pillowcase or place a damp towel between your skin and the bag.  Mix 3 parts water with 1 part rubbing alcohol. Freeze the mixture in a sealable plastic bag. When you remove the mixture from the freezer, it will be slushy. Squeeze out the excess air. Place this bag inside another plastic bag. Slide the bag into a pillowcase or place a damp towel between your skin and the bag.   SEEK MEDICAL CARE IF:  You develop white spots on your skin. This may give the skin a blotchy (mottled) appearance.  Your skin turns blue or pale.  Your skin becomes waxy or hard.  Your swelling gets worse.  MAKE SURE YOU:  Understand  these instructions.  Will watch your condition.  Will get help right away if you are not doing well or get worse.

## 2015-01-13 NOTE — ED Notes (Signed)
PA at bedside.

## 2015-01-13 NOTE — ED Provider Notes (Signed)
CSN: [id number]     Arrival date & time 01/13/15  0430 History   First MD Initiated Contact with Patient 01/13/15 0645     Chief Complaint  Patient presents with  . Back Pain     (Consider location/radiation/quality/duration/timing/severity/associated sxs/prior Treatment) HPI Comments: Patient presents today with a chief complaint of lower back pain.  Pain has been present since yesterday.  She began having pain while she was at work yesterday and standing for long periods of time.  She reports that when she woke up this morning her lower back felt stiff.  Pain radiates down her left leg.  Pain worse with movement and with standing.  She has applied an [employer] pad to the back and also taken a [employer] Powder without relief.  She denies acute injury or trauma.  Denies numbness, tingling, bowel/bladder incontinence, urinary symptoms, fever, chills, or abdominal pain.    Patient is a 54 y.o. female presenting with back pain. The history is provided by the patient.  Back Pain   Past Medical History  Diagnosis Date  . Headache(784.0)   . Allergy   . Hypertension   . Hyperlipidemia   . Back pain    History reviewed. No pertinent past surgical history. Family History  Problem Relation Age of Onset  . CAD Mother   . Hypertension Mother   . Diabetes type II Sister    History  Substance Use Topics  . Smoking status: Current Every Day Smoker -- 1.00 packs/day  . Smokeless tobacco: Not on file  . Alcohol Use: No   OB History    No data available     Review of Systems  Musculoskeletal: Positive for back pain.  All other systems reviewed and are negative.     Allergies  Review of patient's allergies indicates no known allergies.  Home Medications   Prior to Admission medications   Medication Sig Start Date End Date Taking? Authorizing Provider  hydrochlorothiazide (HYDRODIURIL) 12.5 MG tablet Take 1 tablet (12.5 mg total) by mouth daily. 11/14/14  Yes Lance Bosch, NP   acetaminophen (TYLENOL) 325 MG tablet Take 2 tablets (650 mg total) by mouth every 6 (six) hours as needed for pain. Patient not taking: Reported on 11/13/2014 02/24/13   Jonetta Osgood, MD  atorvastatin (LIPITOR) 20 MG tablet Take 1 tablet (20 mg total) by mouth daily. Patient not taking: Reported on 01/13/2015 11/14/14   Lance Bosch, NP  diphenhydramine-acetaminophen (TYLENOL PM) 25-500 MG TABS Take 2 tablets by mouth at bedtime as needed (for sleep). Patient not taking: Reported on 11/13/2014 02/24/13   Jonetta Osgood, MD  metroNIDAZOLE (FLAGYL) 500 MG tablet Take 1 tablet (500 mg total) by mouth 2 (two) times daily. Patient not taking: Reported on 12/06/2014 11/16/14   Lance Bosch, NP  metroNIDAZOLE (METROGEL VAGINAL) 0.75 % vaginal gel Place 1 Applicatorful vaginally 2 (two) times daily. Patient not taking: Reported on 01/13/2015 12/06/14   Lance Bosch, NP   BP 107/72 mmHg  Pulse 64  Temp(Src) 98.1 F (36.7 C) (Oral)  Resp 18  Ht 5\' 2"  (1.575 m)  Wt 148 lb (67.132 kg)  BMI 27.06 kg/m2  SpO2 99% Physical Exam  Constitutional: She appears well-developed and well-nourished.  HENT:  Head: Normocephalic and atraumatic.  Mouth/Throat: Oropharynx is clear and moist.  Neck: Normal range of motion. Neck supple.  Cardiovascular: Normal rate, regular rhythm and normal heart sounds.   Pulmonary/Chest: Effort normal and breath sounds normal.  Abdominal:  Soft. Bowel sounds are normal. She exhibits no distension and no mass. There is no tenderness. There is no rebound and no guarding.  Musculoskeletal: Normal range of motion.       Lumbar back: She exhibits tenderness and bony tenderness. She exhibits normal range of motion, no swelling, no edema and no deformity.  Increased pain with ROM of the lumbar spine  Neurological: She is alert. She has normal strength. Gait normal.  Reflex Scores:      Patellar reflexes are 2+ on the right side and 2+ on the left side. Distal sensation of both  feet intact  Skin: Skin is warm and dry.  Psychiatric: She has a normal mood and affect.  Nursing note and vitals reviewed.   ED Course  Procedures (including critical care time) Labs Review Labs Reviewed - No data to display  Imaging Review No results found.   EKG Interpretation None      MDM   Final diagnoses:  None   Patient with back pain.  No neurological deficits and normal neuro exam.  Patient can walk but states is painful.  No loss of bowel or bladder control.  No concern for cauda equina.  No fever, night sweats, weight loss, h/o cancer, IVDU.  RICE protocol and pain medicine indicated and discussed with patient. Patient stable for discharge.  Return precautions given.     Hyman Bible, PA-C 01/13/15 5625  Linton Flemings, MD 01/14/15 [phone]
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
## 2015-06-04 ENCOUNTER — Other Ambulatory Visit: Payer: Self-pay | Admitting: Internal Medicine

## 2015-06-11 ENCOUNTER — Ambulatory Visit: Payer: Self-pay | Attending: Internal Medicine | Admitting: Internal Medicine

## 2015-06-11 ENCOUNTER — Encounter: Payer: Self-pay | Admitting: Internal Medicine

## 2015-06-11 VITALS — BP 132/84 | HR 71 | Temp 99.2°F | Resp 16 | Ht 62.0 in | Wt 154.0 lb

## 2015-06-11 DIAGNOSIS — Z23 Encounter for immunization: Secondary | ICD-10-CM | POA: Insufficient documentation

## 2015-06-11 DIAGNOSIS — M5432 Sciatica, left side: Secondary | ICD-10-CM | POA: Insufficient documentation

## 2015-06-11 DIAGNOSIS — F17210 Nicotine dependence, cigarettes, uncomplicated: Secondary | ICD-10-CM | POA: Insufficient documentation

## 2015-06-11 DIAGNOSIS — I10 Essential (primary) hypertension: Secondary | ICD-10-CM | POA: Insufficient documentation

## 2015-06-11 DIAGNOSIS — G629 Polyneuropathy, unspecified: Secondary | ICD-10-CM | POA: Insufficient documentation

## 2015-06-11 DIAGNOSIS — E785 Hyperlipidemia, unspecified: Secondary | ICD-10-CM | POA: Insufficient documentation

## 2015-06-11 DIAGNOSIS — R7303 Prediabetes: Secondary | ICD-10-CM

## 2015-06-11 DIAGNOSIS — Z79899 Other long term (current) drug therapy: Secondary | ICD-10-CM | POA: Insufficient documentation

## 2015-06-11 DIAGNOSIS — R7309 Other abnormal glucose: Secondary | ICD-10-CM | POA: Insufficient documentation

## 2015-06-11 LAB — BASIC METABOLIC PANEL
BUN: 7 mg/dL (ref 7–25)
CHLORIDE: 102 mmol/L (ref 98–110)
CO2: 26 mmol/L (ref 20–31)
CREATININE: 0.6 mg/dL (ref 0.50–1.05)
Calcium: 9.5 mg/dL (ref 8.6–10.4)
Glucose, Bld: 87 mg/dL (ref 65–99)
Potassium: 5.3 mmol/L (ref 3.5–5.3)
Sodium: 142 mmol/L (ref 135–146)

## 2015-06-11 LAB — GLUCOSE, POCT (MANUAL RESULT ENTRY): POC GLUCOSE: 99 mg/dL (ref 70–99)

## 2015-06-11 LAB — POCT GLYCOSYLATED HEMOGLOBIN (HGB A1C): Hemoglobin A1C: 6.4

## 2015-06-11 MED ORDER — HYDROCHLOROTHIAZIDE 12.5 MG PO TABS: 12.5000 mg | ORAL_TABLET | Freq: Every day | ORAL | Status: DC

## 2015-06-11 MED ORDER — CYCLOBENZAPRINE HCL 10 MG PO TABS: 10.0000 mg | ORAL_TABLET | Freq: Three times a day (TID) | ORAL | Status: DC | PRN

## 2015-06-11 NOTE — Progress Notes (Signed)
Patient ID: Mallory Wall, female   DOB: [birth year], 54 y.o.   MRN: [id number]  CC: f/u  HPI: Mallory Wall is a 54 y.o. female here today for a follow up visit.  Patient has past medical history of HTN, HLD, and prediabetes.  Patient reports that she has been feeling dizzy and lightheaded since being off her blood pressure medication for one week. She states that she has been trying to walk more and cut out sugary foods since finding out that she is pre-diabetic. She notes that she has been having burning in the tips of her toes at bedtime. She reports this burning sensation for the past 7 months. She has not tired anything for pain.  Patient reports that she went to the ER several weeks ago and was told that she has sciatica. She is requesting a refill of muscle relaxer. She denies pain today.  No Known Allergies Past Medical History  Diagnosis Date  . Headache(784.0)   . Allergy   . Hypertension   . Hyperlipidemia   . Back pain    Current Outpatient Prescriptions on File Prior to Visit  Medication Sig Dispense Refill  . hydrochlorothiazide (HYDRODIURIL) 12.5 MG tablet Take 1 tablet (12.5 mg total) by mouth daily. 30 tablet 4  . atorvastatin (LIPITOR) 20 MG tablet Take 1 tablet (20 mg total) by mouth daily. (Patient not taking: Reported on 01/13/2015) 90 tablet 3  . metroNIDAZOLE (METROGEL VAGINAL) 0.75 % vaginal gel Place 1 Applicatorful vaginally 2 (two) times daily. (Patient not taking: Reported on 01/13/2015) 70 g 3   No current facility-administered medications on file prior to visit.   Family History  Problem Relation Age of Onset  . CAD Mother   . Hypertension Mother   . Diabetes type II Sister    Social History   Social History  . Marital Status: Single    Spouse Name: N/A  . Number of Children: N/A  . Years of Education: N/A   Occupational History  . Not on file.   Social History Main Topics  . Smoking status: Current Every Day Smoker -- 0.50 packs/day  .  Smokeless tobacco: Not on file  . Alcohol Use: No  . Drug Use: No  . Sexual Activity: Not Currently   Other Topics Concern  . Not on file   Social History Narrative   Works at [employer]: Positive for back pain.  Neurological: Positive for dizziness (not currently) and tingling.  All other systems reviewed and are negative.   Objective:   Filed Vitals:   06/11/15 1203  BP: 132/84  Pulse: 71  Temp: 99.2 F (37.3 C)  Resp: 16    Physical Exam  Constitutional: She is oriented to person, place, and time.  Cardiovascular: Normal rate, regular rhythm and normal heart sounds.   Pulmonary/Chest: Effort normal and breath sounds normal.  Musculoskeletal: Normal range of motion. She exhibits no edema or tenderness.  Neurological: She is alert and oriented to person, place, and time. No cranial nerve deficit.  Skin: Skin is warm and dry.   .  Lab Results  Component Value Date   WBC 5.9 11/24/2014   HGB 13.0 11/24/2014   HCT 39.2 11/24/2014   MCV 91.4 11/24/2014   PLT 352 11/24/2014   Lab Results  Component Value Date   CREATININE 0.60 11/24/2014   BUN 9 11/24/2014   NA 145 11/24/2014   K 4.7 11/24/2014   CL 104 11/24/2014  CO2 31 11/24/2014    Lab Results  Component Value Date   HGBA1C 6.40 06/11/2015   Lipid Panel     Component Value Date/Time   CHOL 169 11/24/2014 0916   TRIG 64 11/24/2014 0916   HDL 46 11/24/2014 0916   CHOLHDL 3.7 11/24/2014 0916   VLDL 13 11/24/2014 0916   LDLCALC 110* 11/24/2014 0916       Assessment and plan:   Mallory Wall was seen today for follow-up, dizziness and numbness.  Diagnoses and all orders for this visit:  Essential hypertension -     Basic Metabolic Panel -     hydrochlorothiazide (HYDRODIURIL) 12.5 MG tablet; Take 1 tablet (12.5 mg total) by mouth daily. DASH diet, request medication refill at least 1 week before running out.  Patient blood pressure is stable and may  continue on current medication.  Education on diet, exercise, and modifiable risk factors discussed. Will obtain appropriate labs as needed. Will follow up in 3-6 months.   Prediabetes -     HgB A1c -     Glucose (CBG) -     Microalbumin, urine Continue low fat diet, exercise. Patient may continue to maintain without medication. She is still at the border of 6.4%  Neuropathy Patient reports that she does not want to try additional medication at this time. She will call back if she changes her mind about gabapentin.  Sciatica, left -     cyclobenzaprine (FLEXERIL) 10 MG tablet; Take 1 tablet (10 mg total) by mouth 3 (three) times daily as needed for muscle spasms. Stable, refilled med  Need for Tdap vaccination -     Tdap vaccine greater than or equal to 7yo IM   Return in about 6 months (around 12/12/2015) for Hypertension.       Lance Bosch, [facility] [phone] 06/11/2015, 12:43 PM

## 2015-06-11 NOTE — Patient Instructions (Signed)
Fat and Cholesterol Control Diet Fat and cholesterol levels in your blood and organs are influenced by your diet. High levels of fat and cholesterol may lead to diseases of the heart, small and large blood vessels, gallbladder, liver, and pancreas. CONTROLLING FAT AND CHOLESTEROL WITH DIET Although exercise and lifestyle factors are important, your diet is key. That is because certain foods are known to raise cholesterol and others to lower it. The goal is to balance foods for their effect on cholesterol and more importantly, to replace saturated and trans fat with other types of fat, such as monounsaturated fat, polyunsaturated fat, and omega-3 fatty acids. On average, a person should consume no more than 15 to 17 g of saturated fat daily. Saturated and trans fats are considered "bad" fats, and they will raise LDL cholesterol. Saturated fats are primarily found in animal products such as meats, butter, and cream. However, that does not mean you need to give up all your favorite foods. Today, there are good tasting, low-fat, low-cholesterol substitutes for most of the things you like to eat. Choose low-fat or nonfat alternatives. Choose round or loin cuts of red meat. These types of cuts are lowest in fat and cholesterol. Chicken (without the skin), fish, veal, and ground turkey breast are great choices. Eliminate fatty meats, such as hot dogs and salami. Even shellfish have little or no saturated fat. Have a 3 oz (85 g) portion when you eat lean meat, poultry, or fish. Trans fats are also called "partially hydrogenated oils." They are oils that have been scientifically manipulated so that they are solid at room temperature resulting in a longer shelf life and improved taste and texture of foods in which they are added. Trans fats are found in stick margarine, some tub margarines, cookies, crackers, and baked goods.  When baking and cooking, oils are a great substitute for butter. The monounsaturated oils are  especially beneficial since it is believed they lower LDL and raise HDL. The oils you should avoid entirely are saturated tropical oils, such as coconut and palm.  Remember to eat a lot from food groups that are naturally free of saturated and trans fat, including fish, fruit, vegetables, beans, grains (barley, rice, couscous, bulgur wheat), and pasta (without cream sauces).  IDENTIFYING FOODS THAT LOWER FAT AND CHOLESTEROL  Soluble fiber may lower your cholesterol. This type of fiber is found in fruits such as apples, vegetables such as broccoli, potatoes, and carrots, legumes such as beans, peas, and lentils, and grains such as barley. Foods fortified with plant sterols (phytosterol) may also lower cholesterol. You should eat at least 2 g per day of these foods for a cholesterol lowering effect.  Read package labels to identify low-saturated fats, trans fat free, and low-fat foods at the supermarket. Select cheeses that have only 2 to 3 g saturated fat per ounce. Use a heart-healthy tub margarine that is free of trans fats or partially hydrogenated oil. When buying baked goods (cookies, crackers), avoid partially hydrogenated oils. Breads and muffins should be made from whole grains (whole-wheat or whole oat flour, instead of "flour" or "enriched flour"). Buy non-creamy canned soups with reduced salt and no added fats.  FOOD PREPARATION TECHNIQUES  Never deep-fry. If you must fry, either stir-fry, which uses very little fat, or use non-stick cooking sprays. When possible, broil, bake, or roast meats, and steam vegetables. Instead of putting butter or margarine on vegetables, use lemon and herbs, applesauce, and cinnamon (for squash and sweet potatoes). Use nonfat   yogurt, salsa, and low-fat dressings for salads.  LOW-SATURATED FAT / LOW-FAT FOOD SUBSTITUTES Meats / Saturated Fat (g)  Avoid: Steak, marbled (3 oz/85 g) / 11 g  Choose: Steak, lean (3 oz/85 g) / 4 g  Avoid: Hamburger (3 oz/85 g) / 7  g  Choose: Hamburger, lean (3 oz/85 g) / 5 g  Avoid: Ham (3 oz/85 g) / 6 g  Choose: Ham, lean cut (3 oz/85 g) / 2.4 g  Avoid: Chicken, with skin, dark meat (3 oz/85 g) / 4 g  Choose: Chicken, skin removed, dark meat (3 oz/85 g) / 2 g  Avoid: Chicken, with skin, light meat (3 oz/85 g) / 2.5 g  Choose: Chicken, skin removed, light meat (3 oz/85 g) / 1 g Dairy / Saturated Fat (g)  Avoid: Whole milk (1 cup) / 5 g  Choose: Low-fat milk, 2% (1 cup) / 3 g  Choose: Low-fat milk, 1% (1 cup) / 1.5 g  Choose: Skim milk (1 cup) / 0.3 g  Avoid: Hard cheese (1 oz/28 g) / 6 g  Choose: Skim milk cheese (1 oz/28 g) / 2 to 3 g  Avoid: Cottage cheese, 4% fat (1 cup) / 6.5 g  Choose: Low-fat cottage cheese, 1% fat (1 cup) / 1.5 g  Avoid: Ice cream (1 cup) / 9 g  Choose: Sherbet (1 cup) / 2.5 g  Choose: Nonfat frozen yogurt (1 cup) / 0.3 g  Choose: Frozen fruit bar / trace  Avoid: Whipped cream (1 tbs) / 3.5 g  Choose: Nondairy whipped topping (1 tbs) / 1 g Condiments / Saturated Fat (g)  Avoid: Mayonnaise (1 tbs) / 2 g  Choose: Low-fat mayonnaise (1 tbs) / 1 g  Avoid: Butter (1 tbs) / 7 g  Choose: Extra light margarine (1 tbs) / 1 g  Avoid: Coconut oil (1 tbs) / 11.8 g  Choose: Olive oil (1 tbs) / 1.8 g  Choose: Corn oil (1 tbs) / 1.7 g  Choose: Safflower oil (1 tbs) / 1.2 g  Choose: Sunflower oil (1 tbs) / 1.4 g  Choose: Soybean oil (1 tbs) / 2.4 g  Choose: Canola oil (1 tbs) / 1 g Document Released: 10/13/2005 Document Revised: 02/07/2013 Document Reviewed: 01/11/2014 Exit[employer] Patient Information 2015 [employer]. This information is not intended to replace advice given to you by your health care provider. Make sure you discuss any questions you have with your health care provider.  

## 2015-06-11 NOTE — Progress Notes (Signed)
F/up A1c. Pt concern of pain in both feet, more in L foot, but some slight pain, with burning in R foot x 31mo. Pt reports feeling lightheaded upon standing and feeling unbalanced. She report being off blood medication x1wk.

## 2015-06-12 LAB — MICROALBUMIN, URINE: Microalb, Ur: 1.1 mg/dL (ref <2.0)

## 2015-06-19 ENCOUNTER — Telehealth: Payer: Self-pay

## 2015-06-19 NOTE — Telephone Encounter (Signed)
Patient not available Left message on voice mail to return our call 

## 2015-06-19 NOTE — Telephone Encounter (Signed)
-----   Message from Lance Bosch, NP sent at 06/17/2015  8:06 PM EDT ----- Labs are within normal limits

## 2015-06-19 NOTE — Telephone Encounter (Signed)
Patient returned phone call from the nurse in regards to lab results.  Please follow up.

## 2015-08-13 ENCOUNTER — Other Ambulatory Visit: Payer: Self-pay | Admitting: Internal Medicine

## 2015-08-15 NOTE — Telephone Encounter (Signed)
Pt. Called stating she would like to speak to the nurse. Pt. States she has been bleeding from her nose, she stated it just starts bleeding randomly. It has been going on for 2 months, when she bleeds she feels a little light headed. Please f/u with pt. For advise.

## 2015-08-21 ENCOUNTER — Telehealth: Payer: Self-pay

## 2015-08-21 NOTE — Telephone Encounter (Signed)
Returned patient phone call Patient states she has been having some headaches and on and off nose bleeds Patient states when she gets a nose blood she feels a little light headed Explained to patient it might be that we need to adjust her medication for HTN Patient agreed and transferred call to schedule a nurse visit so we can check her blood pressure

## 2015-08-22 ENCOUNTER — Ambulatory Visit: Payer: Self-pay | Attending: Internal Medicine | Admitting: Pharmacist

## 2015-08-22 ENCOUNTER — Telehealth: Payer: Self-pay | Admitting: Pharmacist

## 2015-08-22 VITALS — BP 117/81 | HR 77

## 2015-08-22 DIAGNOSIS — Z79899 Other long term (current) drug therapy: Secondary | ICD-10-CM | POA: Insufficient documentation

## 2015-08-22 DIAGNOSIS — I10 Essential (primary) hypertension: Secondary | ICD-10-CM | POA: Insufficient documentation

## 2015-08-22 NOTE — Progress Notes (Signed)
Mallory Wall can you call the patient and tell her to try using a saline nasal spray twice per day and see if that helps with nosebleeds

## 2015-08-22 NOTE — Patient Instructions (Signed)
Your blood pressure looks great - keep taking the hydrochlorothiazide every day  Follow up with Chari Manning for further evaluation of the nose bleeds

## 2015-08-22 NOTE — Progress Notes (Signed)
S:    Patient arrives in good spirits.    Presents to the clinic for hypertension evaluation.   Patient reports adherence with medications.  Current BP Medications include:  Hydrochlorothiazide 12.5 mg daily.  Patient reports that her nosebleeds started in September 2016. The blood only comes from her right nostril. She reports that she has at least one nosebleed once a week and they all resolve on their own. She does not have any warning that it will occur.   She has tried to use saline nasal spray but it has not decreased the nosebleeds. She denies any seasonal allergies. She denies any trauma to the mucous membranes. She does report a mild headache. Denies fever or chills. Not on any medication that increases her risk of bleeding.    O:   Last 3 Office BP readings: BP Readings from Last 3 Encounters:  08/22/15 117/81  06/11/15 132/84  01/13/15 107/72    BMET    Component Value Date/Time   NA 142 06/11/2015 1254   K 5.3 06/11/2015 1254   CL 102 06/11/2015 1254   CO2 26 06/11/2015 1254   GLUCOSE 87 06/11/2015 1254   BUN 7 06/11/2015 1254   CREATININE 0.60 06/11/2015 1254   CREATININE 0.54 02/13/2013 0530   CALCIUM 9.5 06/11/2015 1254   GFRNONAA >89 11/24/2014 0916   GFRNONAA >90 02/13/2013 0530   GFRAA >89 11/24/2014 0916   GFRAA >90 02/13/2013 0530    A/P: History of hypertension currently controlled on current medications. No recommendations for any changes to her medications.  I am not sure of the etiology of the nosebleeds but will defer further work up to Chari Manning, NP.   Medication reconciliation completed and patient's medication list was updated. Results reviewed and written information provided.   Total time in face-to-face counseling 10 minutes.  F/U Clinic Visit with Chari Manning.

## 2015-08-22 NOTE — Telephone Encounter (Signed)
Chari Manning, NP, asked that I call the patient and instruct her to use her nasal saline twice daily leading up to her appointment with her next week.  I called patient and informed her of Valerie's instructions. Patient verbalized understanding.  Will follow up with Mateo Flow next week if needed.   Nicoletta Ba, PharmD, BCPS, [facility] [phone]

## 2015-08-29 ENCOUNTER — Ambulatory Visit: Payer: Self-pay | Attending: Internal Medicine | Admitting: Internal Medicine

## 2015-08-29 ENCOUNTER — Encounter: Payer: Self-pay | Admitting: Internal Medicine

## 2015-08-29 VITALS — BP 113/84 | HR 100 | Temp 98.3°F | Resp 16 | Ht 62.0 in | Wt 148.0 lb

## 2015-08-29 DIAGNOSIS — R040 Epistaxis: Secondary | ICD-10-CM

## 2015-08-29 MED ORDER — METRONIDAZOLE 0.75 % VA GEL: 1.0000 | Freq: Two times a day (BID) | VAGINAL | Status: DC

## 2015-08-29 NOTE — Progress Notes (Signed)
Patient presented today for nose bleeds She has been having nose bleeds since mid-September Last week she had 5 in one week Only bleeding from right nare and will drain down her throat

## 2015-08-29 NOTE — Progress Notes (Signed)
   Subjective:    Patient ID: Mallory Wall, female    DOB: 04-18-1961, 54 y.o.   MRN: [id number]  Epistaxis  The bleeding has been from the right nare. This is a recurrent problem. The current episode started more than 1 month ago. Episode frequency: every other day. The problem has been unchanged. The bleeding is associated with nothing. Treatments tried: nasal saline. The treatment provided no relief. There is no history of a bleeding disorder or colds.    Review of Systems  HENT: Positive for nosebleeds. Negative for congestion, rhinorrhea and sinus pressure.   All other systems reviewed and are negative.    Objective:   Physical Exam  Constitutional: She is oriented to person, place, and time.  HENT:  Right Ear: External ear normal.  Left Ear: External ear normal.  Nose: Nose normal.  No intranasal swelling  Neurological: She is alert and oriented to person, place, and time.  Skin: Skin is warm and dry.      Assessment & Plan:  Mallory Wall was seen today for epistaxis.  Diagnoses and all orders for this visit:  Epistaxis Patient will continue to use nasal saline twice daily. She may also try Afrin spray but use no longer than 3 consecutive days to prevent rebound swelling. If problem persist past that then she may require ENT referral. She reports that BP's have been normal even with the nose bleeds.  Return in about 6 months (around 02/26/2016) for Hypertension.   Lance Bosch, NP 08/29/2015 12:20 PM

## 2015-08-29 NOTE — Patient Instructions (Addendum)
Afrin spray may use once daily for nose bleeds. Do not use more than 3 days in a row to prevent rebound inflammation.   Probiotics for women's health.

## 2015-11-20 ENCOUNTER — Other Ambulatory Visit: Payer: Self-pay | Admitting: Internal Medicine

## 2015-11-20 ENCOUNTER — Ambulatory Visit: Payer: Self-pay | Attending: Family Medicine

## 2015-11-20 MED FILL — HYDROCHLOROTHIAZIDE 12.5 MG: 12.5 | 30 days supply | Qty: 30 | Fill #0

## 2015-12-20 MED FILL — HYDROCHLOROTHIAZIDE 12.5 MG: 12.5 | 30 days supply | Qty: 30 | Fill #1

## 2016-01-21 MED FILL — HYDROCHLOROTHIAZIDE 12.5 MG: 12.5 | 30 days supply | Qty: 30 | Fill #2

## 2016-02-12 ENCOUNTER — Ambulatory Visit: Payer: Self-pay | Attending: Internal Medicine | Admitting: Internal Medicine

## 2016-02-12 ENCOUNTER — Encounter: Payer: Self-pay | Admitting: Gastroenterology

## 2016-02-12 ENCOUNTER — Encounter: Payer: Self-pay | Admitting: Internal Medicine

## 2016-02-12 VITALS — BP 127/88 | HR 78 | Temp 98.0°F | Wt 148.2 lb

## 2016-02-12 DIAGNOSIS — R202 Paresthesia of skin: Secondary | ICD-10-CM | POA: Insufficient documentation

## 2016-02-12 DIAGNOSIS — F17210 Nicotine dependence, cigarettes, uncomplicated: Secondary | ICD-10-CM | POA: Insufficient documentation

## 2016-02-12 DIAGNOSIS — R5383 Other fatigue: Secondary | ICD-10-CM | POA: Insufficient documentation

## 2016-02-12 DIAGNOSIS — R87610 Atypical squamous cells of undetermined significance on cytologic smear of cervix (ASC-US): Secondary | ICD-10-CM | POA: Insufficient documentation

## 2016-02-12 DIAGNOSIS — I10 Essential (primary) hypertension: Secondary | ICD-10-CM | POA: Insufficient documentation

## 2016-02-12 DIAGNOSIS — R200 Anesthesia of skin: Secondary | ICD-10-CM | POA: Insufficient documentation

## 2016-02-12 DIAGNOSIS — Z8249 Family history of ischemic heart disease and other diseases of the circulatory system: Secondary | ICD-10-CM | POA: Insufficient documentation

## 2016-02-12 DIAGNOSIS — Z79899 Other long term (current) drug therapy: Secondary | ICD-10-CM | POA: Insufficient documentation

## 2016-02-12 DIAGNOSIS — R87810 Cervical high risk human papillomavirus (HPV) DNA test positive: Secondary | ICD-10-CM

## 2016-02-12 DIAGNOSIS — Z1211 Encounter for screening for malignant neoplasm of colon: Secondary | ICD-10-CM

## 2016-02-12 DIAGNOSIS — N898 Other specified noninflammatory disorders of vagina: Secondary | ICD-10-CM | POA: Insufficient documentation

## 2016-02-12 DIAGNOSIS — Z124 Encounter for screening for malignant neoplasm of cervix: Secondary | ICD-10-CM | POA: Insufficient documentation

## 2016-02-12 DIAGNOSIS — Z0000 Encounter for general adult medical examination without abnormal findings: Secondary | ICD-10-CM

## 2016-02-12 DIAGNOSIS — R7303 Prediabetes: Secondary | ICD-10-CM | POA: Insufficient documentation

## 2016-02-12 DIAGNOSIS — Z720 Tobacco use: Secondary | ICD-10-CM

## 2016-02-12 MED ORDER — SACCHAROMYCES BOULARDII 250 MG PO CAPS: 250.0000 mg | ORAL_CAPSULE | Freq: Two times a day (BID) | ORAL | Status: DC

## 2016-02-12 MED ORDER — NYSTATIN 100000 UNIT/GM EX POWD
5.0000 g | Freq: Three times a day (TID) | CUTANEOUS | Status: DC
Start: 2016-02-12 — End: 2017-03-31

## 2016-02-12 MED FILL — NYSTOP 100,000 UNITS/GM PWD: 100000 | 4 days supply | Qty: 60 | Fill #0

## 2016-02-12 NOTE — Progress Notes (Signed)
Mallory Wall, is a 55 y.o. female  [id number]  [id number]  DOB - [birth year]  CC:  Chief Complaint  Patient presents with  . Numbness    toes on feet x 1 week  . Tingling    tingling x 4 dys  . Vaginal Discharge    white; no odor x 3 mths       HPI: Mallory Wall is a 55 y.o. female here today to establish medical care.  Last seen by NP 11/16 for epistaxis, since resolved after saline nasal spray.  Doing well otherwise except some minor complaints.  +persisting white vaginal discharge last few months, she completed flagyl prx given during last visit, but symptoms came back.  Was told to trial probiotics but too expensive.   Currently not sexually active, postmenopause.    Also c/o of intermittant toes burning last 1-2 wks, no pain though, no open sores, sometimes they feel "tight".  Taking all bp med as prescribed.    No etoh, but smoking .5-1 ppd for years.  Not ready to stop.  Patient has No headache, No chest pain, No abdominal pain - No Nausea, No new weakness tingling or numbness, No Cough - SOB.  No Known Allergies Past Medical History  Diagnosis Date  . Headache(784.0)   . Allergy   . Hypertension   . Hyperlipidemia   . Back pain    Current Outpatient Prescriptions on File Prior to Visit  Medication Sig Dispense Refill  . hydrochlorothiazide (MICROZIDE) 12.5 MG capsule TAKE ONE CAPSULE BY MOUTH DAILY 30 capsule 2   No current facility-administered medications on file prior to visit.   Family History  Problem Relation Age of Onset  . CAD Mother   . Hypertension Mother   . Diabetes type II Sister    Social History   Social History  . Marital Status: Single    Spouse Name: N/A  . Number of Children: N/A  . Years of Education: N/A   Occupational History  . Not on file.   Social History Main Topics  . Smoking status: Current Every Day Smoker -- 0.50 packs/day  . Smokeless tobacco: Not on file  . Alcohol Use: No  . Drug Use: No  . Sexual  Activity: Not Currently   Other Topics Concern  . Not on file   Social History Narrative   Works at [employer]: Constitutional: Negative for fever, chills, diaphoresis, activity change, appetite change; + fatigue. HENT: Negative for ear pain, nosebleeds, congestion, facial swelling, rhinorrhea, neck pain, neck stiffness and ear discharge.  Eyes: Negative for pain, discharge, redness, itching and visual disturbance. Respiratory: Negative for cough, choking, chest tightness, shortness of breath, wheezing and stridor.  Cardiovascular: Negative for chest pain, palpitations and leg swelling. Gastrointestinal: Negative for abdominal distention. Genitourinary: Negative for dysuria, urgency, frequency, hematuria, flank pain, decreased urine volume, difficulty urinating and dyspareunia.   +vaginal discharge Musculoskeletal: Negative for back pain, joint swelling, arthralgia and gait problem. Neurological: Negative for dizziness, tremors, seizures, syncope, facial asymmetry, speech difficulty, weakness, light-headedness, numbness and headaches.  Hematological: Negative for adenopathy. Does not bruise/bleed easily. Psychiatric/Behavioral: Negative for hallucinations, behavioral problems, confusion, dysphoric mood, decreased concentration and agitation.    Objective:   Filed Vitals:   02/12/16 1137  BP: 127/88  Pulse: 78  Temp: 98 F (36.7 C)   bmi 27  Physical Exam: Constitutional: Patient appears well-developed and well-nourished. No distress. AAOx3 HENT: Normocephalic, atraumatic, External right and left ear  normal. Oropharynx is clear and moist.  Eyes: Conjunctivae and EOM are normal. PERRL, no scleral icterus. Neck: Normal ROM. Neck supple. No JVD.  CVS: RRR, S1/S2 +, no murmurs, no gallops, no carotid bruit.  Pulmonary: Effort and breath sounds normal, no stridor, rhonchi, wheezes, rales.  Abdominal: Soft. BS +, no distension, tenderness, rebound or  guarding.  Musculoskeletal: Normal range of motion. No edema and no tenderness.  Pelvic Exam: Cervix normal in appearance, external genitalia normal, no adnexal masses or tenderness, no cervical motion tenderness, rectovaginal septum normal, uterus normal size, shape, and consistency and vagina with copious white /loose discharge noted, no fishy odor. LE: bilat/ no c/c/e, pulses 2+ bilateral. Lymphadenopathy: No lymphadenopathy noted, cervical, inguinal or axillary Neuro: Alert. Normal reflexes, muscle tone coordination. No cranial nerve deficit grossly. Skin: Skin is warm and dry. No rash noted. Not diaphoretic. No erythema. No pallor. Psychiatric: Normal mood and affect. Behavior, judgment, thought content normal.   CMA present during pelvic exam.  Lab Results  Component Value Date   WBC 5.9 11/24/2014   HGB 13.0 11/24/2014   HCT 39.2 11/24/2014   MCV 91.4 11/24/2014   PLT 352 11/24/2014   Lab Results  Component Value Date   CREATININE 0.60 06/11/2015   BUN 7 06/11/2015   NA 142 06/11/2015   K 5.3 06/11/2015   CL 102 06/11/2015   CO2 26 06/11/2015    Lab Results  Component Value Date   HGBA1C 6.40 06/11/2015   Lipid Panel     Component Value Date/Time   CHOL 169 11/24/2014 0916   TRIG 64 11/24/2014 0916   HDL 46 11/24/2014 0916   CHOLHDL 3.7 11/24/2014 0916   VLDL 13 11/24/2014 0916   LDLCALC 110* 11/24/2014 0916       Assessment and plan:   1. Annual physical exam Doing well overall - TSH - Vitamin D, 25-hydroxy - Hemoglobin A1c - will chk/  2. HTN (hypertension), benign Continue hctz 25 qd - CBC with Differential - Basic Metabolic Panel - Lipid Panel - diet/exercise emphasized today,  DASH, increase physical activity  3. Prediabetes - Hemoglobin A1c  4. Vaginal discharge, sp Pap smear, suspect BV vs candidiasis - will f/u w/ labs - nystatin powder rx, and probiotics/Florastor trial vs yogurt w/ live lactobacilli (Activia bid) - Cytology -  PAP  5. Other fatigue ? Etiology unclear, denies depression, chk cbc r/o anemia, but pt denies any abnormal hematuria/hematochezia  6. Tobacco abuse - tob cessation counseling given, not ready to stop  7. Cervical cancer screening - Cytology - PAP  8. Screening for colon cancer dw pt recd for colonoscopy screening, esp in setting of long standing tobacco - Ambulatory referral to Gastroenterology - colonoscopy  9. Financial assistance   3 month f/u.  The patient was given clear instructions to go to ER or return to medical center if symptoms don't improve, worsen or new problems develop. The patient verbalized understanding. The patient was told to call to get lab results if they haven't heard anything in the next week.      Maren Reamer, MD, [facility], [place]   02/12/2016, 1:33 PM

## 2016-02-13 LAB — BASIC METABOLIC PANEL
BUN: 8 mg/dL (ref 7–25)
CO2: 32 mmol/L — AB (ref 20–31)
CREATININE: 0.54 mg/dL (ref 0.50–1.05)
Calcium: 9.1 mg/dL (ref 8.6–10.4)
Chloride: 104 mmol/L (ref 98–110)
Glucose, Bld: 112 mg/dL — ABNORMAL HIGH (ref 65–99)
Potassium: 4.1 mmol/L (ref 3.5–5.3)
Sodium: 144 mmol/L (ref 135–146)

## 2016-02-13 LAB — CBC WITH DIFFERENTIAL/PLATELET
BASOS ABS: 0 {cells}/uL (ref 0–200)
Basophils Relative: 0 %
EOS ABS: 192 {cells}/uL (ref 15–500)
Eosinophils Relative: 3 %
HEMATOCRIT: 38.1 % (ref 35.0–45.0)
Hemoglobin: 12.3 g/dL (ref 11.7–15.5)
Lymphocytes Relative: 36 %
Lymphs Abs: 2304 cells/uL (ref 850–3900)
MCH: 29.5 pg (ref 27.0–33.0)
MCHC: 32.3 g/dL (ref 32.0–36.0)
MCV: 91.4 fL (ref 80.0–100.0)
MONO ABS: 256 {cells}/uL (ref 200–950)
MPV: 9.6 fL (ref 7.5–12.5)
Monocytes Relative: 4 %
NEUTROS ABS: 3648 {cells}/uL (ref 1500–7800)
Neutrophils Relative %: 57 %
Platelets: 378 10*3/uL (ref 140–400)
RBC: 4.17 MIL/uL (ref 3.80–5.10)
RDW: 14.7 % (ref 11.0–15.0)
WBC: 6.4 10*3/uL (ref 3.8–10.8)

## 2016-02-13 LAB — LIPID PANEL
CHOL/HDL RATIO: 3.9 ratio (ref <=5.0)
CHOLESTEROL: 193 mg/dL (ref 125–200)
HDL: 50 mg/dL (ref >=46)
LDL CALC: 132 mg/dL — AB (ref <130)
Triglycerides: 56 mg/dL (ref <150)
VLDL: 11 mg/dL (ref <30)

## 2016-02-13 LAB — CERVICOVAGINAL ANCILLARY ONLY: Wet Prep (BD Affirm): POSITIVE — AB

## 2016-02-13 LAB — HEMOGLOBIN A1C
HEMOGLOBIN A1C: 6.8 % — AB (ref <5.7)
MEAN PLASMA GLUCOSE: 148 mg/dL

## 2016-02-13 LAB — CYTOLOGY - PAP

## 2016-02-13 LAB — TSH: TSH: 0.51 mIU/L

## 2016-02-13 LAB — VITAMIN D 25 HYDROXY (VIT D DEFICIENCY, FRACTURES): Vit D, 25-Hydroxy: 11 ng/mL — ABNORMAL LOW (ref 30–100)

## 2016-02-15 LAB — CERVICOVAGINAL ANCILLARY ONLY: Herpes: NEGATIVE

## 2016-02-18 ENCOUNTER — Other Ambulatory Visit: Payer: Self-pay | Admitting: Internal Medicine

## 2016-02-18 DIAGNOSIS — R87611 Atypical squamous cells cannot exclude high grade squamous intraepithelial lesion on cytologic smear of cervix (ASC-H): Secondary | ICD-10-CM

## 2016-02-18 MED ORDER — VITAMIN D (ERGOCALCIFEROL) 1.25 MG (50000 UNIT) PO CAPS: 50000.0000 [IU] | ORAL_CAPSULE | ORAL | Status: DC

## 2016-02-18 MED ORDER — SIMVASTATIN 20 MG PO TABS: 20.0000 mg | ORAL_TABLET | Freq: Every day | ORAL | Status: DC

## 2016-02-18 MED ORDER — METRONIDAZOLE 500 MG PO TABS: 500.0000 mg | ORAL_TABLET | Freq: Two times a day (BID) | ORAL | Status: DC

## 2016-02-18 MED FILL — ?SIMVASTATIN 20 MG TABLET: 20 MG | 30 days supply | Qty: 30 | Fill #0

## 2016-02-18 MED FILL — VIT D2 1.25 MG (50,000 UNIT: 1.25 MG | 84 days supply | Qty: 12 | Fill #0

## 2016-02-18 MED FILL — ?HYDROCHLOROTHIAZIDE 12.5MG: 12.5 | 30 days supply | Qty: 30 | Fill #3

## 2016-02-18 MED FILL — metroNIDAZOLE 500 MG TABS: 500 | 7 days supply | Qty: 14 | Fill #0

## 2016-02-20 ENCOUNTER — Encounter: Payer: Self-pay | Admitting: Obstetrics & Gynecology

## 2016-02-29 ENCOUNTER — Telehealth: Payer: Self-pay | Admitting: *Deleted

## 2016-02-29 NOTE — Telephone Encounter (Signed)
-----   Message from Maren Reamer, MD sent at 02/18/2016 12:47 PM EDT ----- Please call pt w/ results.  Vit D low, can cause bone/muscle pains, rx in chart.  Take 3 months and will rechk levels than.  Diabetes we know is bit out of control, so please take rx.  Thyroid normal, kidneys normal.  Her cholesterol panel is abnormal. Her LDL cholesterol is elavated 132, want it <120 with her Diabetes risk factor.  I put in rx for simvastatin 20 daily.  To address this please limit saturated fat to no more than 7% of your calories, limit cholesterol to 200 mg/day, increase fiber and exercise as tolerated.  We will rechk levels in 4-49months as well.  Blood count normal, no anemia.  ## her pap smear was abnormal with signs of abnormal cell lines, does not Mean cancer though.  Will send her to OB-gyn referral for further workup.  No STDs found on work up, but has BV+. Flagyl rx in chart as well. Thanks.

## 2016-02-29 NOTE — Telephone Encounter (Signed)
Medical Assistant left message on patient's home and cell voicemail. Voicemail states to give a call back to Yazleen Molock with [facility] at [phone].  

## 2016-03-04 ENCOUNTER — Telehealth: Payer: Self-pay | Admitting: *Deleted

## 2016-03-04 NOTE — Telephone Encounter (Signed)
Mallory Wall called and left a message yesterday afternoon she wants to talk to a nurse about What her appt is for on 03/20/16.  Per chart review was referred to [place] for a colposcopy for abnormal pap.  I called Mallory Wall and explained what referral was for and what colposcopy is.  She voices understanding.

## 2016-03-05 ENCOUNTER — Ambulatory Visit (AMBULATORY_SURGERY_CENTER): Payer: Self-pay | Admitting: *Deleted

## 2016-03-05 VITALS — Ht 62.0 in | Wt 145.0 lb

## 2016-03-05 DIAGNOSIS — Z1211 Encounter for screening for malignant neoplasm of colon: Secondary | ICD-10-CM

## 2016-03-05 MED ORDER — NA SULFATE-K SULFATE-MG SULF 17.5-3.13-1.6 GM/177ML PO SOLN: ORAL | Status: DC

## 2016-03-05 NOTE — Progress Notes (Signed)
Patient denies any allergies to eggs or soy. Patient denies any problems with anesthesia/sedation. Patient denies any oxygen use at home and does not take any diet/weight loss medications. EMMI education assisgned to patient on colonoscopy, this was explained and instructions given to patient. suprep sample given to patient today during PV.

## 2016-03-17 ENCOUNTER — Other Ambulatory Visit: Payer: Self-pay | Admitting: Internal Medicine

## 2016-03-17 MED FILL — NYSTOP 100,000 UNITS/GM PWD: 100000 | 4 days supply | Qty: 60 | Fill #1

## 2016-03-17 MED FILL — HYDROCHLOROTHIAZIDE 12.5 MG: 12.5 | 30 days supply | Qty: 30 | Fill #0

## 2016-03-18 ENCOUNTER — Encounter: Payer: Self-pay | Admitting: Gastroenterology

## 2016-03-18 ENCOUNTER — Ambulatory Visit (AMBULATORY_SURGERY_CENTER): Payer: Self-pay | Admitting: Gastroenterology

## 2016-03-18 VITALS — BP 121/78 | HR 68 | Temp 97.1°F | Resp 12 | Ht 62.0 in | Wt 145.0 lb

## 2016-03-18 DIAGNOSIS — K635 Polyp of colon: Secondary | ICD-10-CM

## 2016-03-18 DIAGNOSIS — Z1211 Encounter for screening for malignant neoplasm of colon: Secondary | ICD-10-CM

## 2016-03-18 DIAGNOSIS — D125 Benign neoplasm of sigmoid colon: Secondary | ICD-10-CM

## 2016-03-18 MED ORDER — SODIUM CHLORIDE 0.9 % IV SOLN: 500.0000 mL | INTRAVENOUS | Status: DC

## 2016-03-18 NOTE — Progress Notes (Signed)
Called to room to assist during endoscopic procedure.  Patient ID and intended procedure confirmed with present staff. Received instructions for my participation in the procedure from the performing physician.  

## 2016-03-18 NOTE — Progress Notes (Signed)
  [facility] Post-op Note  Patient: Mallory Wall  Procedure(s) Performed: colonoscopy  Patient Location: [facility] - Recovery Area  Anesthesia Type: Deep Sedation/Propofol  Level of Consciousness: awake, oriented and patient cooperative  Airway and Oxygen Therapy: Patient Spontanous Breathing  Post-op Pain: none  Post-op Assessment:  Post-op Vital signs reviewed, Patient's Cardiovascular Status Stable, Respiratory Function Stable, Patent Airway, No signs of Nausea or vomiting and Pain level controlled  Post-op Vital Signs: Reviewed and stable  Complications: No apparent anesthesia complications  [name] E 11:08 AM

## 2016-03-18 NOTE — Patient Instructions (Signed)
YOU HAD AN ENDOSCOPIC PROCEDURE TODAY AT [facility]:   Refer to the procedure report that was given to you for any specific questions about what was found during the examination.  If the procedure report does not answer your questions, please call your gastroenterologist to clarify.  If you requested that your care partner not be given the details of your procedure findings, then the procedure report has been included in a sealed envelope for you to review at your convenience later.  YOU SHOULD EXPECT: Some feelings of bloating in the abdomen. Passage of more gas than usual.  Walking can help get rid of the air that was put into your GI tract during the procedure and reduce the bloating. If you had a lower endoscopy (such as a colonoscopy or flexible sigmoidoscopy) you may notice spotting of blood in your stool or on the toilet paper. If you underwent a bowel prep for your procedure, you may not have a normal bowel movement for a few days.  Please Note:  You might notice some irritation and congestion in your nose or some drainage.  This is from the oxygen used during your procedure.  There is no need for concern and it should clear up in a day or so.  SYMPTOMS TO REPORT IMMEDIATELY:   Following lower endoscopy (colonoscopy or flexible sigmoidoscopy):  Excessive amounts of blood in the stool  Significant tenderness or worsening of abdominal pains  Swelling of the abdomen that is new, acute  Fever of 100F or higher    For urgent or emergent issues, a gastroenterologist can be reached at any hour by calling [phone].   DIET: Your first meal following the procedure should be a small meal and then it is ok to progress to your normal diet. Heavy or fried foods are harder to digest and may make you feel nauseous or bloated.  Likewise, meals heavy in dairy and vegetables can increase bloating.  Drink plenty of fluids but you should avoid alcoholic beverages for 24  hours.  ACTIVITY:  You should plan to take it easy for the rest of today and you should NOT DRIVE or use heavy machinery until tomorrow (because of the sedation medicines used during the test).    FOLLOW UP: Our staff will call the number listed on your records the next business day following your procedure to check on you and address any questions or concerns that you may have regarding the information given to you following your procedure. If we do not reach you, we will leave a message.  However, if you are feeling well and you are not experiencing any problems, there is no need to return our call.  We will assume that you have returned to your regular daily activities without incident.  If any biopsies were taken you will be contacted by phone or by letter within the next 1-3 weeks.  Please call us at [phone] if you have not heard about the biopsies in 3 weeks.    SIGNATURES/CONFIDENTIALITY: You and/or your care partner have signed paperwork which will be entered into your electronic medical record.  These signatures attest to the fact that that the information above on your After Visit Summary has been reviewed and is understood.  Full responsibility of the confidentiality of this discharge information lies with you and/or your care-partner   Information on polyps ,diverticulosis ,hemorrhoids and high fiber diet given to you today.

## 2016-03-18 NOTE — Op Note (Signed)
[place] Patient Name: Mallory Wall Procedure Date: 03/18/2016 10:39 AM MRN: [id number] Endoscopist: Mauri Pole , MD Age: 55 Referring MD:  Date of Birth: [birth year] Gender: Female Procedure:                Colonoscopy Indications:              Screening for colorectal malignant neoplasm Medicines:                Monitored Anesthesia Care Procedure:                Pre-Anesthesia Assessment:                           - Prior to the procedure, a History and Physical                            was performed, and patient medications and                            allergies were reviewed. The patient's tolerance of                            previous anesthesia was also reviewed. The risks                            and benefits of the procedure and the sedation                            options and risks were discussed with the patient.                            All questions were answered, and informed consent                            was obtained. Prior Anticoagulants: The patient has                            taken no previous anticoagulant or antiplatelet                            agents. ASA Grade Assessment: II - A patient with                            mild systemic disease. After reviewing the risks                            and benefits, the patient was deemed in                            satisfactory condition to undergo the procedure.                           After obtaining informed consent, the colonoscope  was passed under direct vision. Throughout the                            procedure, the patient's blood pressure, pulse, and                            oxygen saturations were monitored continuously. The                            Model CF-HQ190L [id number]) scope was introduced                            through the anus and advanced to the the terminal                            ileum, with identification of  the appendiceal                            orifice and IC valve. The colonoscopy was performed                            without difficulty. The patient tolerated the                            procedure well. The quality of the bowel                            preparation was excellent. The terminal ileum,                            ileocecal valve, appendiceal orifice, and rectum                            were photographed. Scope In: 10:51:01 AM Scope Out: 11:02:07 AM Scope Withdrawal Time: 0 hours 8 minutes 25 seconds  Total Procedure Duration: 0 hours 11 minutes 6 seconds  Findings:                 The perianal and digital rectal examinations were                            normal.                           Two sessile polyps were found in the sigmoid colon.                            The polyps were 4 to 6 mm in size. These polyps                            were removed with a cold snare. Resection and                            retrieval were complete.  Multiple small-mouthed diverticula were found in                            the sigmoid colon and descending colon.                           Non-bleeding internal hemorrhoids were found during                            retroflexion. The hemorrhoids were small. Complications:            No immediate complications. Estimated Blood Loss:     Estimated blood loss was minimal. Impression:               - Two 4 to 6 mm polyps in the sigmoid colon,                            removed with a cold snare. Resected and retrieved.                           - Diverticulosis in the sigmoid colon and in the                            descending colon.                           - Non-bleeding internal hemorrhoids. Recommendation:           - Patient has a contact number available for                            emergencies. The signs and symptoms of potential                            delayed complications were  discussed with the                            patient. Return to normal activities tomorrow.                            Written discharge instructions were provided to the                            patient.                           - Resume previous diet.                           - Continue present medications.                           - Await pathology results.                           - Repeat colonoscopy in 5-10 years for surveillance  based on pathology results. Mauri Pole, MD 03/18/2016 11:12:48 AM This report has been signed electronically.

## 2016-03-19 ENCOUNTER — Telehealth: Payer: Self-pay | Admitting: *Deleted

## 2016-03-19 NOTE — Telephone Encounter (Signed)
  Follow up Call-  Call back number 03/18/2016  Post procedure Call Back phone  # [phone] cell  Permission to leave phone message Yes     Patient questions:  Do you have a fever, pain , or abdominal swelling? No. Pain Score  0 *  Have you tolerated food without any problems? Yes.    Have you been able to return to your normal activities? Yes.    Do you have any questions about your discharge instructions: Diet   No. Medications  No. Follow up visit  No.  Do you have questions or concerns about your Care? No.  Actions: * If pain score is 4 or above: No action needed, pain <4.

## 2016-03-20 ENCOUNTER — Encounter: Payer: Self-pay | Admitting: Obstetrics and Gynecology

## 2016-03-20 ENCOUNTER — Ambulatory Visit (INDEPENDENT_AMBULATORY_CARE_PROVIDER_SITE_OTHER): Payer: Self-pay | Admitting: Obstetrics and Gynecology

## 2016-03-20 ENCOUNTER — Other Ambulatory Visit (HOSPITAL_COMMUNITY)
Admission: RE | Admit: 2016-03-20 | Discharge: 2016-03-20 | Disposition: A | Discharge status: Home / Self Care | Payer: Self-pay | Source: Ambulatory Visit | Attending: Obstetrics and Gynecology | Admitting: Obstetrics and Gynecology

## 2016-03-20 VITALS — BP 110/77 | HR 83 | Temp 99.0°F | Ht 62.0 in | Wt 145.3 lb

## 2016-03-20 DIAGNOSIS — R87810 Cervical high risk human papillomavirus (HPV) DNA test positive: Secondary | ICD-10-CM

## 2016-03-20 DIAGNOSIS — N870 Mild cervical dysplasia: Secondary | ICD-10-CM | POA: Insufficient documentation

## 2016-03-20 DIAGNOSIS — R87610 Atypical squamous cells of undetermined significance on cytologic smear of cervix (ASC-US): Secondary | ICD-10-CM

## 2016-03-20 LAB — POCT PREGNANCY, URINE: Preg Test, Ur: NEGATIVE

## 2016-03-20 NOTE — Progress Notes (Signed)
GYNECOLOGY CLINIC COLPOSCOPY VISIT AND PROCEDURE NOTE  55 y.o. G1P0010 here for colposcopy for ascus/hrhpv pap smear on 01/2016. Prior cervical cytology and/or colposcopy findings: hrhpv pos normal cytology 10/2013. No abnormals before that. The patient reports the following prior treatments to the vulva/vagina/cervix: none. The patient is a cigarette smoker. The patient is not immunosuppressed. The patient is not pregnant. The patient denies taking anticoagulants and denies allergy to iodine.  Patient given informed consent, signed copy in the chart, time out was performed. Urine pregnancy test performed and confirmed to be negative.  Placed in lithotomy position.  Gross findings:   Vagina: normal mucosa  Vulva: normal mucosa  Cervix: normal mucosa  Visualization after:  Acetic acid: no acetowhite changes  Green or blue filter: no abnormal vessels   Upper one-third of vagina examined, revealing: normal mucosa  Biopsies obtained: no  ECC specimen obtained: yes  Colposcopy adequate? No  All specimens were labelled and sent to pathology.  Patient was given post procedure instructions.  Will follow up pathology and manage accordingly.      Laurey Arrow, MD OB/GYN Fellow

## 2016-03-21 ENCOUNTER — Encounter: Payer: Self-pay | Admitting: General Practice

## 2016-03-25 ENCOUNTER — Encounter: Payer: Self-pay | Admitting: Obstetrics and Gynecology

## 2016-03-25 DIAGNOSIS — N870 Mild cervical dysplasia: Secondary | ICD-10-CM | POA: Insufficient documentation

## 2016-03-26 ENCOUNTER — Telehealth: Payer: Self-pay | Admitting: General Practice

## 2016-03-26 NOTE — Telephone Encounter (Signed)
Per Dr Si Raider, patient's colpo shows cin1, needs pap with hpv testing in one year. Called patient & informed her of results & recommendations. Patient verbalized understanding & had no questions

## 2016-03-28 ENCOUNTER — Telehealth: Payer: Self-pay

## 2016-03-28 NOTE — Telephone Encounter (Signed)
Pt has been informed of Colp results and to follow up next year with a Pap Smear.

## 2016-03-31 ENCOUNTER — Encounter: Payer: Self-pay | Admitting: Gastroenterology

## 2016-04-21 MED FILL — ?HYDROCHLOROTHIAZIDE 12.5MG: 12.5 | 30 days supply | Qty: 30 | Fill #1

## 2016-04-21 MED FILL — ?SIMVASTATIN 20 MG TABLET: 20 MG | 30 days supply | Qty: 30 | Fill #1

## 2016-05-21 MED FILL — ?HYDROCHLOROTHIAZIDE 12.5MG: 12.5 | 30 days supply | Qty: 30 | Fill #2

## 2016-06-03 ENCOUNTER — Encounter (HOSPITAL_COMMUNITY): Payer: Self-pay | Admitting: Emergency Medicine

## 2016-06-03 ENCOUNTER — Emergency Department (HOSPITAL_COMMUNITY)
Admission: EM | Admit: 2016-06-03 | Discharge: 2016-06-03 | Disposition: A | Discharge status: Home / Self Care | Payer: Worker's Compensation | Attending: Emergency Medicine | Admitting: Emergency Medicine

## 2016-06-03 ENCOUNTER — Emergency Department (HOSPITAL_COMMUNITY): Payer: Worker's Compensation

## 2016-06-03 DIAGNOSIS — I10 Essential (primary) hypertension: Secondary | ICD-10-CM | POA: Diagnosis not present

## 2016-06-03 DIAGNOSIS — S61301A Unspecified open wound of left index finger with damage to nail, initial encounter: Secondary | ICD-10-CM | POA: Diagnosis not present

## 2016-06-03 DIAGNOSIS — F17210 Nicotine dependence, cigarettes, uncomplicated: Secondary | ICD-10-CM | POA: Insufficient documentation

## 2016-06-03 DIAGNOSIS — W268XXA Contact with other sharp object(s), not elsewhere classified, initial encounter: Secondary | ICD-10-CM | POA: Insufficient documentation

## 2016-06-03 DIAGNOSIS — Z7982 Long term (current) use of aspirin: Secondary | ICD-10-CM | POA: Insufficient documentation

## 2016-06-03 DIAGNOSIS — Y9389 Activity, other specified: Secondary | ICD-10-CM | POA: Insufficient documentation

## 2016-06-03 DIAGNOSIS — Z79899 Other long term (current) drug therapy: Secondary | ICD-10-CM | POA: Diagnosis not present

## 2016-06-03 DIAGNOSIS — Y929 Unspecified place or not applicable: Secondary | ICD-10-CM | POA: Diagnosis not present

## 2016-06-03 DIAGNOSIS — Y999 Unspecified external cause status: Secondary | ICD-10-CM | POA: Insufficient documentation

## 2016-06-03 DIAGNOSIS — S61209A Unspecified open wound of unspecified finger without damage to nail, initial encounter: Secondary | ICD-10-CM

## 2016-06-03 DIAGNOSIS — S61211A Laceration without foreign body of left index finger without damage to nail, initial encounter: Secondary | ICD-10-CM | POA: Diagnosis present

## 2016-06-03 MED ORDER — CEPHALEXIN 500 MG PO CAPS: 500.0000 mg | ORAL_CAPSULE | Freq: Four times a day (QID) | ORAL | 0 refills | Status: AC

## 2016-06-03 MED ORDER — HYDROCODONE-ACETAMINOPHEN 5-325 MG PO TABS: 1.0000 | ORAL_TABLET | ORAL | 0 refills | Status: DC | PRN

## 2016-06-03 MED ORDER — HYDROCODONE-ACETAMINOPHEN 5-325 MG PO TABS
2.0000 | ORAL_TABLET | Freq: Once | ORAL | Status: AC
  Administered 2016-06-03: 2 via ORAL
  Filled 2016-06-03: qty 2

## 2016-06-03 NOTE — ED Triage Notes (Signed)
Pt lacerated left distal index finger lateral to nail bed with "tomato slicer" machinery for slicing tomatoes and meats. Moderate-heavy bleeding from site. Sensation intact. Irrigated with 1 liter normal saline. Bleeding controlled at this time.

## 2016-06-03 NOTE — Discharge Instructions (Signed)
Medications: Keflex, Norco  Treatment: Take Keflex as prescribed for 5 days. Take 1-2 Norco every 6 hours as needed for severe pain. Take ibuprofen every 4-6 hours as needed for mild to moderate pain. Do not drive or operate machinery when taking Norco and do not mix with Tylenol.  Follow-up: Please follow-up with Dr. Amedeo Plenty, a hand surgeon, for further evaluation and treatment of your injury. Please return to emergency department if you develop any new or worsening symptoms.

## 2016-06-03 NOTE — ED Provider Notes (Signed)
[facility] DEPT Provider Note   CSN: [id number] Arrival date & time: 06/03/16  1244  First Provider Contact:   First MD Initiated Contact with Patient 06/03/16 1409     By signing my name below, I, Rayna Sexton, attest that this documentation has been prepared under the direction and in the presence of [employer], PA-C.  Electronically Signed: Rayna Sexton, ED Scribe. 06/03/16. 2:23 PM.   History   Chief Complaint Chief Complaint  Patient presents with  . Laceration    HPI HPI Comments: Mallory Wall is a 55 y.o. female who presents to the Emergency Department complaining of a laceration with moderate bleeding to the distal tip of her left index finger which occurred at 11:30 AM. She states she was using a commercial slicer to cut tomatoes and accidentally cut the affected region. Pt states she immediately irrigated the wound with warm water and wrapped it with gauze before coming to the ED. She reports associated, moderate, pain across the wound that worsens with palpation of the region as well as a mild difficulty with extension of the affected finger stating it feels "like it gets stuck" which she denies was present prior to her injury. She states her tetanus vaccination is UTD. She denies CP, SOB, abd pain, n/v or any other regions of pain.   The history is provided by the patient. No language interpreter was used.    Past Medical History:  Diagnosis Date  . Allergy   . Back pain   . Headache(784.0)   . Hyperlipidemia   . Hypertension   . Vaginal Pap smear, abnormal     Patient Active Problem List   Diagnosis Date Noted  . Dysplasia of cervix, low grade (CIN 1) 03/25/2016  . Elevated BP 01/30/2014  . Bacterial vaginosis 10/31/2013  . DDD (degenerative disc disease), lumbar 03/18/2013  . History of pyelonephritis 03/18/2013  . Prediabetes 03/18/2013  . Dyslipidemia 03/18/2013    History reviewed. No pertinent surgical history.  OB History    Gravida  Para Term Preterm AB Living   1       1     SAB TAB Ectopic Multiple Live Births     1             Home Medications    Prior to Admission medications   Medication Sig Start Date End Date Taking? Authorizing Provider  Aspirin-Acetaminophen-Caffeine (GOODYS EXTRA STRENGTH PO) Take 1-2 packets by mouth every 6 (six) hours as needed (For pain.).   Yes Historical Provider, MD  BIOTIN PO Take 1 tablet by mouth daily.   Yes Historical Provider, MD  hydrochlorothiazide (MICROZIDE) 12.5 MG capsule TAKE ONE CAPSULE BY MOUTH DAILY 03/17/16  Yes Maren Reamer, MD  simvastatin (ZOCOR) 20 MG tablet Take 1 tablet (20 mg total) by mouth at bedtime. 02/18/16  Yes Maren Reamer, MD  Vitamin D, Ergocalciferol, (DRISDOL) 50000 units CAPS capsule Take 1 capsule (50,000 Units total) by mouth every 7 (seven) days. Patient taking differently: Take 50,000 Units by mouth every Thursday.  02/18/16  Yes Dawn Lazarus Gowda, MD  cephALEXin (KEFLEX) 500 MG capsule Take 1 capsule (500 mg total) by mouth 4 (four) times daily. 06/03/16 06/08/16  Frederica Kuster, PA-C  HYDROcodone-acetaminophen (NORCO/VICODIN) 5-325 MG tablet Take 1-2 tablets by mouth every 4 (four) hours as needed. 06/03/16   Frederica Kuster, PA-C  nystatin (MYCOSTATIN/NYSTOP) 100000 UNIT/GM POWD Apply 5 g topically 3 (three) times daily. Patient not taking: Reported on 06/03/2016 02/12/16  Maren Reamer, MD  saccharomyces boulardii (FLORASTOR) 250 MG capsule Take 1 capsule (250 mg total) by mouth 2 (two) times daily. Patient not taking: Reported on 06/03/2016 02/12/16   Maren Reamer, MD    Family History Family History  Problem Relation Age of Onset  . CAD Mother   . Hypertension Mother   . Diabetes type II Sister   . Colon cancer Neg Hx   . Esophageal cancer Neg Hx   . Pancreatic cancer Neg Hx   . Rectal cancer Neg Hx   . Stomach cancer Neg Hx     Social History Social History  Substance Use Topics  . Smoking status: Current Every Day Smoker     Packs/day: 0.50    Types: Cigarettes  . Smokeless tobacco: Never Used  . Alcohol use No     Allergies   Review of patient's allergies indicates no known allergies.   Review of Systems Review of Systems  Constitutional: Negative for chills and fever.  HENT: Negative for facial swelling and sore throat.   Respiratory: Negative for shortness of breath.   Cardiovascular: Negative for chest pain.  Gastrointestinal: Negative for abdominal pain, nausea and vomiting.  Genitourinary: Negative for dysuria.  Musculoskeletal: Positive for myalgias. Negative for back pain.  Skin: Positive for wound. Negative for rash.  Neurological: Negative for headaches.  Psychiatric/Behavioral: The patient is not nervous/anxious.    Physical Exam Updated Vital Signs BP 112/80 (BP Location: Right Arm)   Pulse 72   Temp 98.3 F (36.8 C) (Oral)   Resp 16   SpO2 99%   Physical Exam  Constitutional: She appears well-developed and well-nourished. No distress.  HENT:  Head: Normocephalic and atraumatic.  Eyes: Conjunctivae are normal. Pupils are equal, round, and reactive to light. Right eye exhibits no discharge. Left eye exhibits no discharge. No scleral icterus.  Neck: Normal range of motion. Neck supple. No thyromegaly present.  Cardiovascular: Normal rate, regular rhythm, normal heart sounds and intact distal pulses.  Exam reveals no gallop and no friction rub.   No murmur heard. Pulmonary/Chest: Effort normal and breath sounds normal. No stridor. No respiratory distress. She has no wheezes. She has no rales.  Abdominal: Soft. Bowel sounds are normal. She exhibits no distension. There is no tenderness. There is no rebound and no guarding.  Musculoskeletal: She exhibits no edema.  Full range of motion intact at all DIP and PIPs, cap refill <2 secs; normal sensation; abduction and abduction intact; patient able to make a fist; wrist extension intact  Lymphadenopathy:    She has no cervical  adenopathy.  Neurological: She is alert. No sensory deficit. Coordination normal.  Nml sensation  Skin: Skin is warm and dry. No rash noted. She is not diaphoretic. No pallor.  Fingertip avulsion to left index finger as indicated in image below; bleeding uncontrolled on my exam  Psychiatric: She has a normal mood and affect.  Nursing note and vitals reviewed.        ED Treatments / Results  Labs (all labs ordered are listed, but only abnormal results are displayed) Labs Reviewed - No data to display  EKG  EKG Interpretation None       Radiology Dg Finger Index Left  Result Date: 06/03/2016 CLINICAL DATA:  55 year old female with a history of laceration EXAM: LEFT INDEX FINGER 2+V COMPARISON:  None. FINDINGS: Bony details slightly obscured by overlying wound dressing. No displaced fracture. No joint dislocation/ subluxation. Soft tissues not well evaluated. No definite  radiopaque foreign body. Degenerative changes of the interphalangeal joints. IMPRESSION: Soft tissues and bony elements somewhat obscured by overlying wound dressing, however, no acute fracture identified. No definite radiopaque foreign body. Signed, Dulcy Fanny. Earleen Newport, DO [facility] Electronically Signed   By: Corrie Mckusick D.O.   On: 06/03/2016 13:40    Procedures Procedures   Wound care completed by myself and Dr. Duffy Bruce. Tourniquet placed on left index finger. Wound cleaned with 1 L of normal saline, Betadine, and a septic 1 spray. Hemostatic gauze with pressure dressing placed. This did not control bleeding. I repeated the above and bleeding was controlled. Dressing to the left on until and follow-up. Patient placed in a static finger splint.  DIAGNOSTIC STUDIES: Oxygen Saturation is 100% on RA, normal by my interpretation.    COORDINATION OF CARE: 2:20 PM Discussed next steps with pt. Pt verbalized understanding and is agreeable with the plan.     Medications Ordered in ED Medications  HYDROcodone-acetaminophen (NORCO/VICODIN) 5-325 MG per tablet 2 tablet (2 tablets Oral Given 06/03/16 1448)     Initial Impression / Assessment and Plan / ED Course  I have reviewed the triage vital signs and the nursing notes.  Pertinent labs & imaging results that were available during my care of the patient were reviewed by me and considered in my medical decision making (see chart for details).  Clinical Course    X-ray negative for fracture or foreign body. Avulsion to left index tuft distal to the DIP. No nailbed involvement. Tetanus UTD. Laceration occurred < 12 hours PTA. Unable to suture repair. Bleeding controlled with hemostatic gauze and Coban pressure dressing with success after 2 trials. Patient to leave bandage on until seen by hand surgery in 3-5 days. Patient given static finger splint. Patient discharged with Keflex for prophylaxis and Norco for pain control. Patient understands and agrees with plan. Return precautions discussed. Patient also evaluated by Dr. Ellender Hose, who assisted with the wound care and guided the patient's management. Patient vitals stable throughout ED course and discharged in satisfactory condition.  I personally performed the services described in this documentation, which was scribed in my presence. The recorded information has been reviewed and is accurate.   Final Clinical Impressions(s) / ED Diagnoses   Final diagnoses:  Avulsion of finger tip, initial encounter    New Prescriptions New Prescriptions   CEPHALEXIN (KEFLEX) 500 MG CAPSULE    Take 1 capsule (500 mg total) by mouth 4 (four) times daily.   HYDROCODONE-ACETAMINOPHEN (NORCO/VICODIN) 5-325 MG TABLET    Take 1-2 tablets by mouth every 4 (four) hours as needed.     Frederica Kuster, PA-C 06/03/16 1626    Duffy Bruce, MD 06/04/16 [phone]

## 2016-06-03 NOTE — ED Triage Notes (Signed)
PA and EDP at bedside. Wound care completed with Quick clot

## 2016-06-03 NOTE — ED Triage Notes (Signed)
1.5 cm laceration on outer aspect of index finger continues to ooze blood. Bandage reapplied. Ice pack applied

## 2016-06-20 MED FILL — ?HYDROCHLOROTHIAZIDE 12.5MG: 12.5 | 30 days supply | Qty: 30 | Fill #3

## 2016-11-06 ENCOUNTER — Encounter (HOSPITAL_COMMUNITY): Payer: Self-pay

## 2016-11-06 ENCOUNTER — Emergency Department (HOSPITAL_COMMUNITY)
Admission: EM | Admit: 2016-11-06 | Discharge: 2016-11-07 | Disposition: A | Discharge status: Home / Self Care | Payer: Self-pay | Attending: Emergency Medicine | Admitting: Emergency Medicine

## 2016-11-06 ENCOUNTER — Emergency Department (HOSPITAL_COMMUNITY): Payer: Self-pay

## 2016-11-06 DIAGNOSIS — Z7982 Long term (current) use of aspirin: Secondary | ICD-10-CM | POA: Insufficient documentation

## 2016-11-06 DIAGNOSIS — R69 Illness, unspecified: Secondary | ICD-10-CM

## 2016-11-06 DIAGNOSIS — J111 Influenza due to unidentified influenza virus with other respiratory manifestations: Secondary | ICD-10-CM | POA: Insufficient documentation

## 2016-11-06 DIAGNOSIS — Z79899 Other long term (current) drug therapy: Secondary | ICD-10-CM | POA: Insufficient documentation

## 2016-11-06 DIAGNOSIS — F17210 Nicotine dependence, cigarettes, uncomplicated: Secondary | ICD-10-CM | POA: Insufficient documentation

## 2016-11-06 DIAGNOSIS — I10 Essential (primary) hypertension: Secondary | ICD-10-CM | POA: Insufficient documentation

## 2016-11-06 LAB — COMPREHENSIVE METABOLIC PANEL
ALK PHOS: 71 U/L (ref 38–126)
ALT: 19 U/L (ref 14–54)
ANION GAP: 12 (ref 5–15)
AST: 25 U/L (ref 15–41)
Albumin: 3.9 g/dL (ref 3.5–5.0)
BILIRUBIN TOTAL: 0.2 mg/dL — AB (ref 0.3–1.2)
BUN: <5 mg/dL — ABNORMAL LOW (ref 6–20)
CALCIUM: 9.2 mg/dL (ref 8.9–10.3)
CO2: 27 mmol/L (ref 22–32)
Chloride: 98 mmol/L — ABNORMAL LOW (ref 101–111)
Creatinine, Ser: 0.73 mg/dL (ref 0.44–1.00)
GFR calc non Af Amer: >60 mL/min (ref >60)
Glucose, Bld: 116 mg/dL — ABNORMAL HIGH (ref 65–99)
POTASSIUM: 3.8 mmol/L (ref 3.5–5.1)
SODIUM: 137 mmol/L (ref 135–145)
TOTAL PROTEIN: 7.9 g/dL (ref 6.5–8.1)

## 2016-11-06 LAB — CBC WITH DIFFERENTIAL/PLATELET
BASOS ABS: 0 10*3/uL (ref 0.0–0.1)
Basophils Relative: 0 %
Eosinophils Absolute: 0 10*3/uL (ref 0.0–0.7)
Eosinophils Relative: 0 %
HEMATOCRIT: 40 % (ref 36.0–46.0)
HEMOGLOBIN: 13.3 g/dL (ref 12.0–15.0)
Lymphocytes Relative: 16 %
Lymphs Abs: 1.4 10*3/uL (ref 0.7–4.0)
MCH: 29.4 pg (ref 26.0–34.0)
MCHC: 33.3 g/dL (ref 30.0–36.0)
MCV: 88.5 fL (ref 78.0–100.0)
Monocytes Absolute: 0.6 10*3/uL (ref 0.1–1.0)
Monocytes Relative: 6 %
NEUTROS ABS: 6.9 10*3/uL (ref 1.7–7.7)
NEUTROS PCT: 78 %
Platelets: 283 10*3/uL (ref 150–400)
RBC: 4.52 MIL/uL (ref 3.87–5.11)
RDW: 15.2 % (ref 11.5–15.5)
WBC: 8.8 10*3/uL (ref 4.0–10.5)

## 2016-11-06 LAB — I-STAT CG4 LACTIC ACID, ED: Lactic Acid, Venous: 1.46 mmol/L (ref 0.5–1.9)

## 2016-11-06 MED ORDER — ONDANSETRON HCL 4 MG/2ML IJ SOLN
4.0000 mg | Freq: Once | INTRAMUSCULAR | Status: AC
Start: 2016-11-06 — End: 2016-11-06
  Administered 2016-11-06: 4 mg via INTRAVENOUS
  Filled 2016-11-06: qty 2

## 2016-11-06 MED ORDER — SODIUM CHLORIDE 0.9 % IV BOLUS (SEPSIS)
1000.0000 mL | Freq: Once | INTRAVENOUS | Status: AC
  Administered 2016-11-06: 1000 mL via INTRAVENOUS

## 2016-11-06 MED ORDER — ACETAMINOPHEN 325 MG PO TABS
ORAL_TABLET | ORAL | Status: AC
  Filled 2016-11-06: qty 2

## 2016-11-06 MED ORDER — ACETAMINOPHEN 325 MG PO TABS
650.0000 mg | ORAL_TABLET | Freq: Once | ORAL | Status: AC | PRN
  Administered 2016-11-06: 650 mg via ORAL

## 2016-11-06 NOTE — ED Notes (Signed)
Pt requesting tylenol - reports nothing was given yet

## 2016-11-06 NOTE — ED Provider Notes (Signed)
[facility] DEPT Provider Note   CSN: [id number] Arrival date & time: 11/06/16  [place]     History   Chief Complaint Chief Complaint  Patient presents with  . Fever  . Cough    HPI Mallory Wall is a 56 y.o. female.  HPI   56 year old female with history of hypertension, hyperlipidemia, allergy presenting with flulike symptoms. Patient report for the past 4 days she has had a fever MAXIMUM TEMPERATURE 103, chills, generalized body aches, early satiety, headache, throat irritation, cough occasionally productive with white sputum, and generalized fatigue. Did report recent sick contact when her niece developed similar symptoms. She has been using over-the-counter medication including TheraFlu, Robitussin, and cold pack with minimal improvement. She endorse occasional posttussive emesis but still able to drink fluid. She denies any diarrhea, abdominal pain, shortness of breath, neck stiffness, or rash. She has not had a flu or pneumonia shot this year. She is a smoker but currently trying to quit.  Past Medical History:  Diagnosis Date  . Allergy   . Back pain   . Headache(784.0)   . Hyperlipidemia   . Hypertension   . Vaginal Pap smear, abnormal     Patient Active Problem List   Diagnosis Date Noted  . Dysplasia of cervix, low grade (CIN 1) 03/25/2016  . Elevated BP 01/30/2014  . Bacterial vaginosis 10/31/2013  . DDD (degenerative disc disease), lumbar 03/18/2013  . History of pyelonephritis 03/18/2013  . Prediabetes 03/18/2013  . Dyslipidemia 03/18/2013    History reviewed. No pertinent surgical history.  OB History    Gravida Para Term Preterm AB Living   1       1     SAB TAB Ectopic Multiple Live Births     1             Home Medications    Prior to Admission medications   Medication Sig Start Date End Date Taking? Authorizing Provider  Aspirin-Acetaminophen-Caffeine (GOODYS EXTRA STRENGTH PO) Take 1-2 packets by mouth every 6 (six) hours as needed  (For pain.).    Historical Provider, MD  BIOTIN PO Take 1 tablet by mouth daily.    Historical Provider, MD  hydrochlorothiazide (MICROZIDE) 12.5 MG capsule TAKE ONE CAPSULE BY MOUTH DAILY 03/17/16   Maren Reamer, MD  HYDROcodone-acetaminophen (NORCO/VICODIN) 5-325 MG tablet Take 1-2 tablets by mouth every 4 (four) hours as needed. 06/03/16   Frederica Kuster, PA-C  nystatin (MYCOSTATIN/NYSTOP) 100000 UNIT/GM POWD Apply 5 g topically 3 (three) times daily. Patient not taking: Reported on 06/03/2016 02/12/16   Maren Reamer, MD  saccharomyces boulardii (FLORASTOR) 250 MG capsule Take 1 capsule (250 mg total) by mouth 2 (two) times daily. Patient not taking: Reported on 06/03/2016 02/12/16   Maren Reamer, MD  simvastatin (ZOCOR) 20 MG tablet Take 1 tablet (20 mg total) by mouth at bedtime. 02/18/16   Maren Reamer, MD  Vitamin D, Ergocalciferol, (DRISDOL) 50000 units CAPS capsule Take 1 capsule (50,000 Units total) by mouth every 7 (seven) days. Patient taking differently: Take 50,000 Units by mouth every Thursday.  02/18/16   Maren Reamer, MD    Family History Family History  Problem Relation Age of Onset  . CAD Mother   . Hypertension Mother   . Diabetes type II Sister   . Colon cancer Neg Hx   . Esophageal cancer Neg Hx   . Pancreatic cancer Neg Hx   . Rectal cancer Neg Hx   . Stomach cancer  Neg Hx     Social History Social History  Substance Use Topics  . Smoking status: Current Every Day Smoker    Packs/day: 0.50    Types: Cigarettes  . Smokeless tobacco: Never Used  . Alcohol use No     Allergies   Patient has no known allergies.   Review of Systems Review of Systems  All other systems reviewed and are negative.    Physical Exam Updated Vital Signs BP 106/70   Pulse 109   Temp (!) 103.1 F (39.5 C) (Oral)   Resp 18   Ht 5\' 2"  (1.575 m)   Wt 68 kg   SpO2 100%   BMI 27.44 kg/m   Physical Exam  Constitutional: She is oriented to person, place, and  time. She appears well-developed and well-nourished. No distress.  Nontoxic in appearance  HENT:  Head: Atraumatic.  Right Ear: External ear normal.  Mouth is dry  Eyes: Conjunctivae are normal.  Neck: Neck supple.  No nuchal rigidity  Cardiovascular:  Mild tachycardia without murmurs rubs or gallops  Pulmonary/Chest: Effort normal and breath sounds normal.  Faint scattered rhonchi, clear with cough  Abdominal: She exhibits no distension. There is no tenderness.  Neurological: She is alert and oriented to person, place, and time.  Skin: No rash noted.  Psychiatric: She has a normal mood and affect.  Nursing note and vitals reviewed.    ED Treatments / Results  Labs (all labs ordered are listed, but only abnormal results are displayed) Labs Reviewed  COMPREHENSIVE METABOLIC PANEL - Abnormal; Notable for the following:       Result Value   Chloride 98 (*)    Glucose, Bld 116 (*)    BUN <5 (*)    Total Bilirubin 0.2 (*)    All other components within normal limits  CBC WITH DIFFERENTIAL/PLATELET  I-STAT CG4 LACTIC ACID, ED  I-STAT CG4 LACTIC ACID, ED    EKG  EKG Interpretation None       Radiology Dg Chest 2 View  Result Date: 11/06/2016 CLINICAL DATA:  Coughing and vomiting EXAM: CHEST  2 VIEW COMPARISON:  02/11/2013 FINDINGS: The heart size and mediastinal contours are within normal limits. Both lungs are clear. The visualized skeletal structures are unremarkable. IMPRESSION: No active cardiopulmonary disease. Electronically Signed   By: Donavan Foil M.D.   On: 11/06/2016 18:24    Procedures Procedures (including critical care time)  Medications Ordered in ED Medications  acetaminophen (TYLENOL) 325 MG tablet (not administered)  acetaminophen (TYLENOL) tablet 650 mg (650 mg Oral Given 11/06/16 2017)     Initial Impression / Assessment and Plan / ED Course  I have reviewed the triage vital signs and the nursing notes.  Pertinent labs & imaging results that  were available during my care of the patient were reviewed by me and considered in my medical decision making (see chart for details).  Clinical Course     BP 96/64   Pulse 82   Temp 100.2 F (37.9 C)   Resp 16   Ht 5\' 2"  (1.575 m)   Wt 68 kg   SpO2 98%   BMI 27.44 kg/m    Final Clinical Impressions(s) / ED Diagnoses   Final diagnoses:  Influenza-like illness    New Prescriptions New Prescriptions   BENZONATATE (TESSALON) 100 MG CAPSULE    Take 1 capsule (100 mg total) by mouth every 8 (eight) hours.   PROMETHAZINE-DEXTROMETHORPHAN (PROMETHAZINE-DM) 6.25-15 MG/5ML SYRUP    Take 5  mLs by mouth 4 (four) times daily as needed for cough.   10:39 PM Patient presents with flulike symptoms. She does have an elevated temperature of 103.1, mildly tachycardic with a heart rate of 109. Suspect tachycardia secondary to fever. Patient received Tylenol. She is otherwise well-appearing. Her labs are reassuring, normal lactic acid, normal WBC, and a chest x-ray without acute focal infiltrate concerning for pneumonia. Will provide symptomatic treatment, anticipate discharge.   Domenic Moras, PA-C 11/07/16 [id number]    Merrily Pew, MD 11/10/16 [phone]

## 2016-11-06 NOTE — ED Notes (Signed)
Pt temp 103.1. Pt taken to Triage for tylenol.

## 2016-11-06 NOTE — ED Triage Notes (Signed)
Per Pt, Pt reports fever and Cough that started on Monday and has not decreased since then. Pt complains of pain with cough and white sputum.

## 2016-11-07 MED ORDER — BENZONATATE 100 MG PO CAPS: 100.0000 mg | ORAL_CAPSULE | Freq: Three times a day (TID) | ORAL | 0 refills | Status: DC

## 2016-11-07 MED ORDER — PROMETHAZINE-DM 6.25-15 MG/5ML PO SYRP: 5.0000 mL | ORAL_SOLUTION | Freq: Four times a day (QID) | ORAL | 0 refills | Status: DC | PRN

## 2016-11-07 NOTE — ED Notes (Signed)
Pt ambulatory to bathroom. Steady gait.

## 2016-11-07 NOTE — ED Notes (Signed)
E-signature not available, pt verbalized understanding of DC instructions and prescriptions 

## 2017-01-03 IMAGING — MG MM DIGITAL SCREENING BILAT
4 series · 4 of 4 positions shown · non-contrast
Comparison: Previous exam(s).

CLINICAL DATA: Screening.

EXAM:
DIGITAL SCREENING BILATERAL MAMMOGRAM WITH CAD

[R CC]
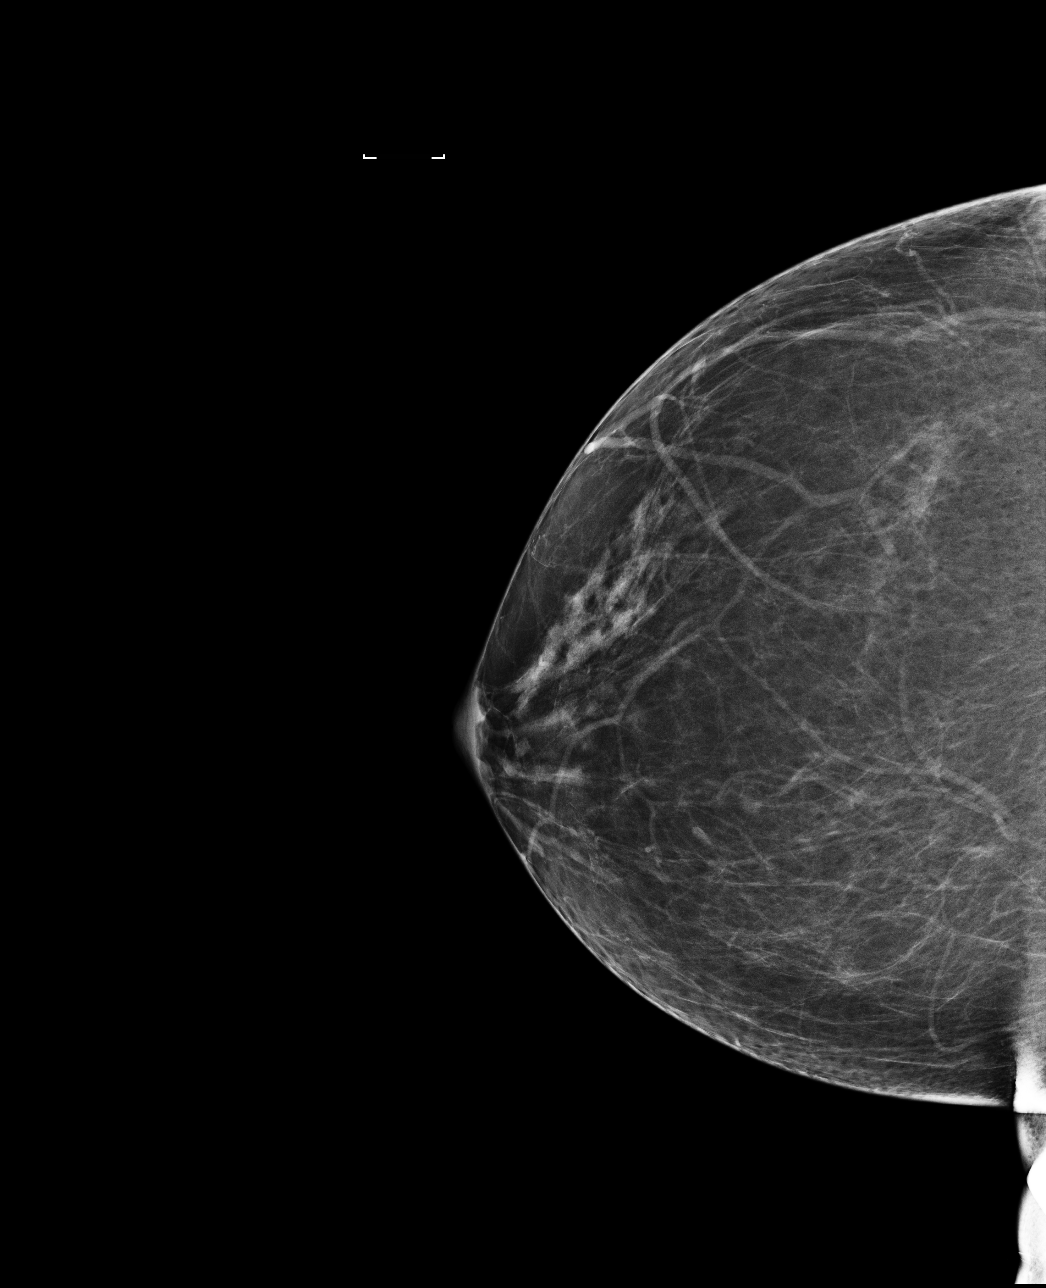

[R MLO]
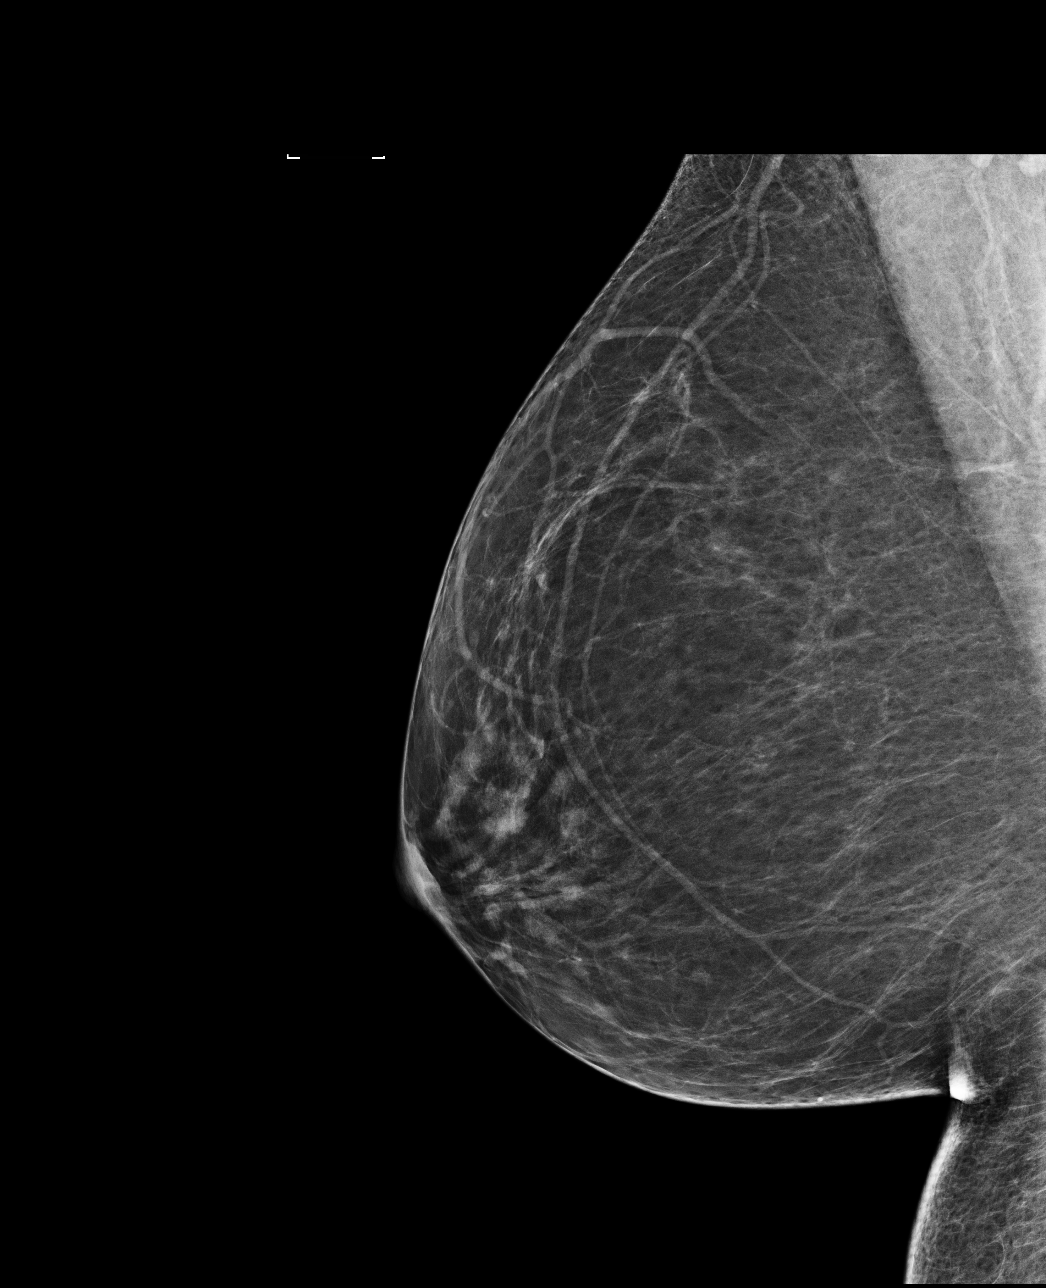

[L MLO]
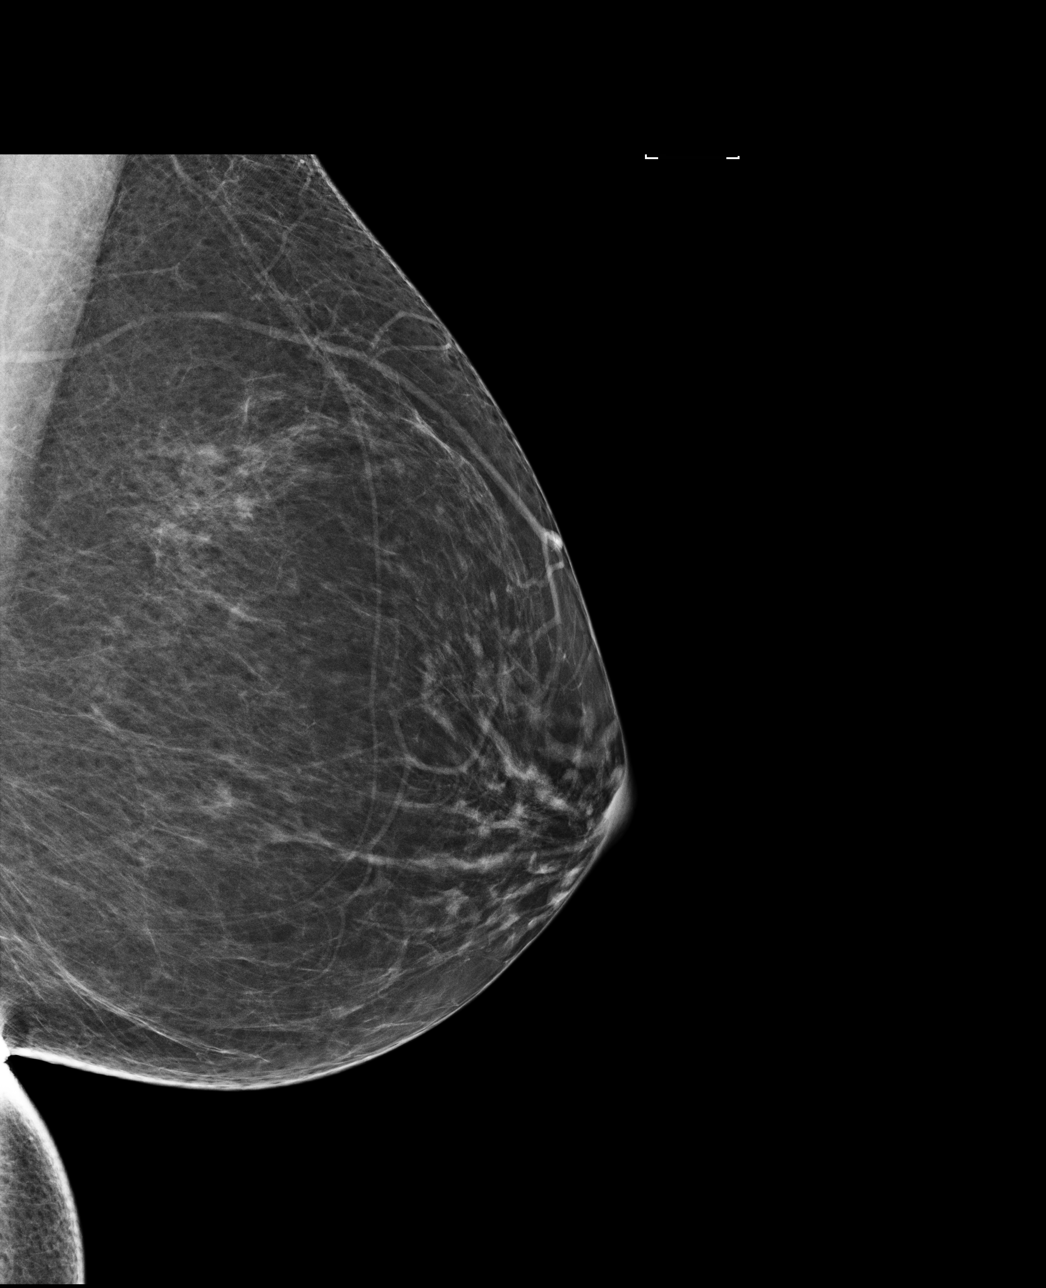

[L CC]
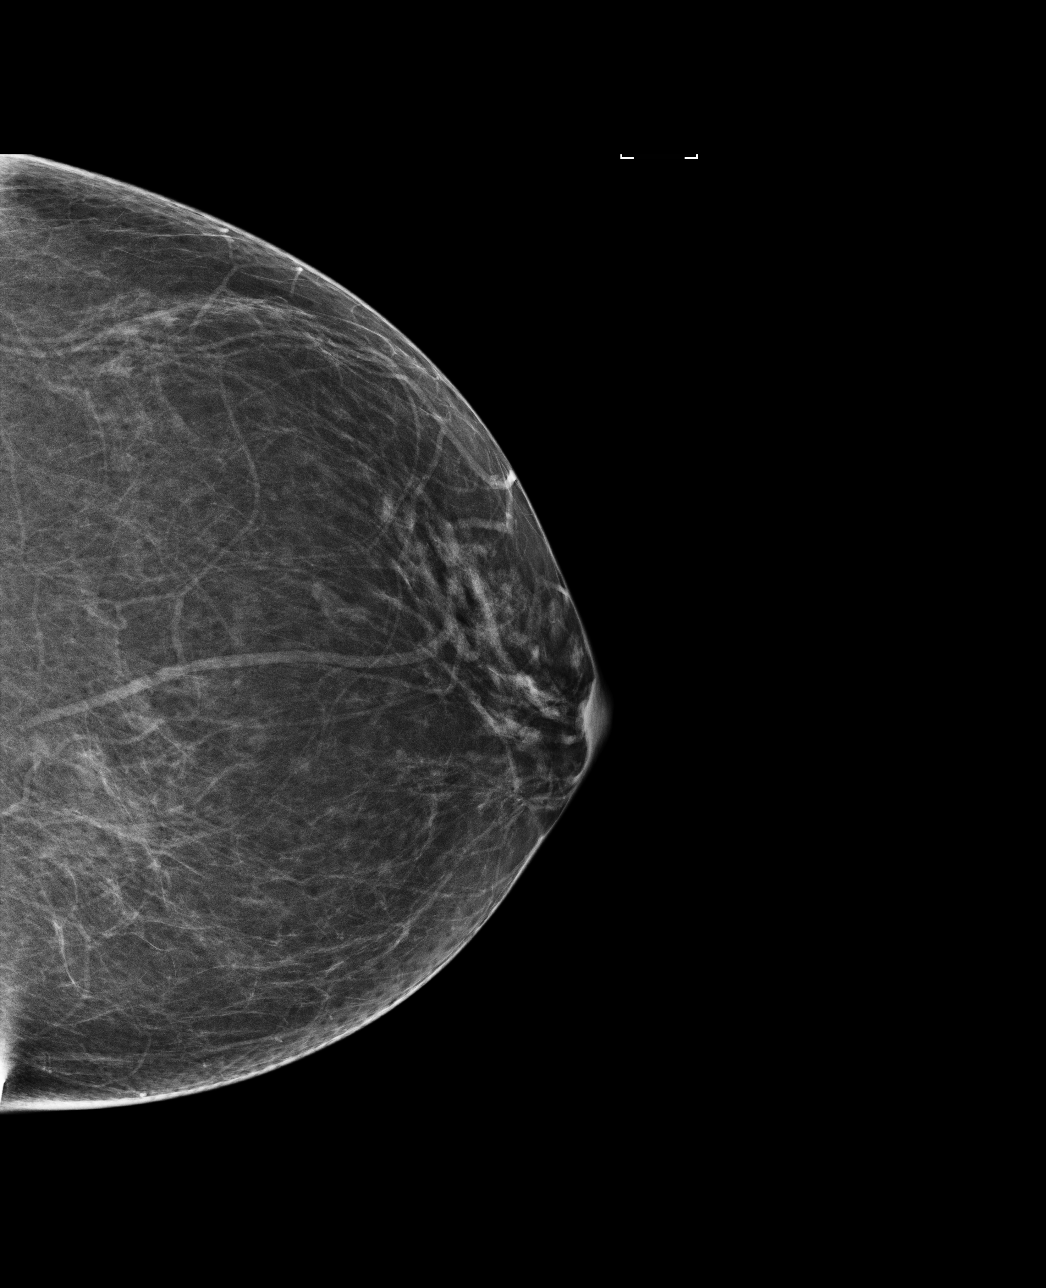

[4 of 4 positions shown; findings below may reference images not displayed]

ACR Breast Density Category b: There are scattered areas of
fibroglandular density.
FINDINGS: There are no findings suspicious for malignancy. Images were
processed with CAD.
IMPRESSION: No mammographic evidence of malignancy. A result letter of this
screening mammogram will be mailed directly to the patient.

RECOMMENDATION:
Screening mammogram in one year. (Code:AS-G-LCT)

BI-RADS CATEGORY  1: Negative.

## 2017-03-12 ENCOUNTER — Encounter: Payer: Self-pay | Admitting: Internal Medicine

## 2017-03-13 ENCOUNTER — Encounter: Payer: Self-pay | Admitting: Internal Medicine

## 2017-03-16 ENCOUNTER — Encounter: Payer: Self-pay | Admitting: Internal Medicine

## 2017-03-31 ENCOUNTER — Encounter (HOSPITAL_COMMUNITY): Payer: Self-pay | Admitting: Emergency Medicine

## 2017-03-31 ENCOUNTER — Ambulatory Visit (HOSPITAL_COMMUNITY)
Admission: EM | Admit: 2017-03-31 | Discharge: 2017-03-31 | Disposition: A | Discharge status: Home / Self Care | Payer: Self-pay | Attending: Internal Medicine | Admitting: Internal Medicine

## 2017-03-31 DIAGNOSIS — M546 Pain in thoracic spine: Secondary | ICD-10-CM | POA: Insufficient documentation

## 2017-03-31 DIAGNOSIS — S76211A Strain of adductor muscle, fascia and tendon of right thigh, initial encounter: Secondary | ICD-10-CM

## 2017-03-31 DIAGNOSIS — N898 Other specified noninflammatory disorders of vagina: Secondary | ICD-10-CM | POA: Insufficient documentation

## 2017-03-31 DIAGNOSIS — S29019A Strain of muscle and tendon of unspecified wall of thorax, initial encounter: Secondary | ICD-10-CM

## 2017-03-31 DIAGNOSIS — G8929 Other chronic pain: Secondary | ICD-10-CM | POA: Insufficient documentation

## 2017-03-31 LAB — POCT URINALYSIS DIP (DEVICE)
Bilirubin Urine: NEGATIVE
Glucose, UA: NEGATIVE mg/dL
HGB URINE DIPSTICK: NEGATIVE
Ketones, ur: NEGATIVE mg/dL
Leukocytes, UA: NEGATIVE
NITRITE: NEGATIVE
PH: 5 (ref 5.0–8.0)
PROTEIN: NEGATIVE mg/dL
Specific Gravity, Urine: 1.025 (ref 1.005–1.030)
UROBILINOGEN UA: 0.2 mg/dL (ref 0.0–1.0)

## 2017-03-31 MED ORDER — METRONIDAZOLE 500 MG PO TABS: 500.0000 mg | ORAL_TABLET | Freq: Two times a day (BID) | ORAL | 0 refills | Status: DC

## 2017-03-31 MED ORDER — NAPROXEN 375 MG PO TABS: 375.0000 mg | ORAL_TABLET | Freq: Two times a day (BID) | ORAL | 0 refills | Status: DC

## 2017-03-31 MED FILL — NAPROXEN 375 MG TABLET: 375 | 10 days supply | Qty: 20 | Fill #0

## 2017-03-31 MED FILL — metroNIDAZOLE 500 MG TABS: 500 | 7 days supply | Qty: 14 | Fill #0

## 2017-03-31 NOTE — Discharge Instructions (Signed)
Apply heat to the muscle of your back. He may want to try the thermic care wraps. Perform stretches as demonstrated several times a day is opportunity allows. Also do these at home before and after work. The pain in the right lower abdomen is actually in the groin where the muscles and tendons cross from the leg to the pelvic bone. A sample has been taking from your vaginal secretions and will be tested. For now we will treat with Flagyl. If any other organisms grow out that need to be treated we will call you to let you know.

## 2017-03-31 NOTE — ED Provider Notes (Signed)
CSN: [id number]     Arrival date & time 03/31/17  1103 History   First MD Initiated Contact with Patient 03/31/17 1348     Chief Complaint  Patient presents with  . Flank Pain  . Vaginal Discharge   (Consider location/radiation/quality/duration/timing/severity/associated sxs/prior Treatment) 56 year old female complaining of pain in the right lower most corner of the abdomen for 2 months. It comes and goes. It is often exacerbated by walking and straight leg lifts. This started about 2 months ago.  Second complaint is that of a vaginal discharge that she believes is associated with an infection in which it she has to take Flagyl. Denies pelvic pain. Third complaint is pain to the right parathoracic musculature. She has had this for a couple of weeks. At the outset she was having pain in the same area with bending and certain movements. No known injury, trauma or fall. She states she has to stand at work for several hours at a time.        Past Medical History:  Diagnosis Date  . Allergy   . Back pain   . Headache(784.0)   . Hyperlipidemia   . Hypertension   . Vaginal Pap smear, abnormal    History reviewed. No pertinent surgical history. Family History  Problem Relation Age of Onset  . CAD Mother   . Hypertension Mother   . Diabetes type II Sister   . Colon cancer Neg Hx   . Esophageal cancer Neg Hx   . Pancreatic cancer Neg Hx   . Rectal cancer Neg Hx   . Stomach cancer Neg Hx    Social History  Substance Use Topics  . Smoking status: Current Every Day Smoker    Packs/day: 0.50    Types: Cigarettes  . Smokeless tobacco: Never Used  . Alcohol use No   OB History    Gravida Para Term Preterm AB Living   1       1     SAB TAB Ectopic Multiple Live Births     1           Review of Systems  Constitutional: Positive for activity change. Negative for chills and fever.  HENT: Negative.   Respiratory: Negative.   Cardiovascular: Negative.   Gastrointestinal:  Negative.   Genitourinary: Negative for dysuria, frequency and pelvic pain.  Musculoskeletal: Positive for back pain and myalgias.       As per HPI  Skin: Negative for color change, pallor and rash.  Neurological: Negative.   All other systems reviewed and are negative.   Allergies  Patient has no known allergies.  Home Medications   Prior to Admission medications   Medication Sig Start Date End Date Taking? Authorizing Provider  metroNIDAZOLE (FLAGYL) 500 MG tablet Take 1 tablet (500 mg total) by mouth 2 (two) times daily. X 7 days 03/31/17   Janne Napoleon, NP  naproxen (NAPROSYN) 375 MG tablet Take 1 tablet (375 mg total) by mouth 2 (two) times daily. Prn muscle pain 03/31/17   Janne Napoleon, NP   Meds Ordered and Administered this Visit  Medications - No data to display  BP 124/73 (BP Location: Right Arm)   Pulse 88   Temp 98.4 F (36.9 C) (Oral)   Resp 18   SpO2 99%  No data found.   Physical Exam  Constitutional: She is oriented to person, place, and time. She appears well-developed and well-nourished. No distress.  Eyes: EOM are normal.  Neck: Normal range of motion.  Neck supple.  Cardiovascular: Normal rate and regular rhythm.   Pulmonary/Chest: Effort normal and breath sounds normal.  Abdominal: Soft. Bowel sounds are normal. She exhibits no distension and no mass. There is no tenderness. There is no rebound and no guarding. No hernia.  Musculoskeletal: She exhibits no edema or deformity.  The right lower most quadrant abdominal pain is actually located in the right inguinal crease. Palpation of the flexor tendons produces pain. Straight leg raise produces pain in the same area. No tenderness over the pelvis. No abdominal tenderness.  The complaints of back pain or located just right of the mid parathoracic musculature with areas reproducible tenderness. No flank pain. No lateral abdominal tenderness.  Neurological: She is alert and oriented to person, place, and time.   Skin: Skin is warm and dry.  Psychiatric: She has a normal mood and affect.  Nursing note and vitals reviewed.   Urgent Care Course     Procedures (including critical care time)  Labs Review Labs Reviewed  POCT URINALYSIS DIP (DEVICE)  CERVICOVAGINAL ANCILLARY ONLY    Imaging Review No results found.   Visual Acuity Review  Right Eye Distance:   Left Eye Distance:   Bilateral Distance:    Right Eye Near:   Left Eye Near:    Bilateral Near:         MDM   1. Thoracic myofascial strain, initial encounter   2. Vaginal discharge   3. Inguinal strain, right, initial encounter   4. Chronic right-sided thoracic back pain    Apply heat to the muscle of your back. He may want to try the thermic care wraps. Perform stretches as demonstrated several times a day is opportunity allows. Also do these at home before and after work. The pain in the right lower abdomen is actually in the groin where the muscles and tendons cross from the leg to the pelvic bone. A sample has been taking from your vaginal secretions and will be tested. For now we will treat with Flagyl. If any other organisms grow out that need to be treated we will call you to let you know. Meds ordered this encounter  Medications  . naproxen (NAPROSYN) 375 MG tablet    Sig: Take 1 tablet (375 mg total) by mouth 2 (two) times daily. Prn muscle pain    Dispense:  20 tablet    Refill:  0    Order Specific Question:   Supervising Provider    Answer:   Sherlene Shams [[id number]]  . metroNIDAZOLE (FLAGYL) 500 MG tablet    Sig: Take 1 tablet (500 mg total) by mouth 2 (two) times daily. X 7 days    Dispense:  14 tablet    Refill:  0    Order Specific Question:   Supervising Provider    Answer:   Sherlene Shams [[id number]]       Janne Napoleon, NP 03/31/17 1432

## 2017-03-31 NOTE — ED Triage Notes (Signed)
The patient presented to the [facility] with a complaint of right side flank pain and a vaginal discharge x 2 months.

## 2017-04-02 LAB — CERVICOVAGINAL ANCILLARY ONLY
Bacterial vaginitis: POSITIVE — AB
Candida vaginitis: NEGATIVE
Chlamydia: NEGATIVE
Neisseria Gonorrhea: NEGATIVE
Trichomonas: NEGATIVE

## 2017-10-08 ENCOUNTER — Ambulatory Visit (HOSPITAL_COMMUNITY)
Admission: EM | Admit: 2017-10-08 | Discharge: 2017-10-08 | Disposition: A | Discharge status: Home / Self Care | Payer: BLUE CROSS/BLUE SHIELD | Attending: Internal Medicine | Admitting: Internal Medicine

## 2017-10-08 ENCOUNTER — Other Ambulatory Visit: Payer: Self-pay

## 2017-10-08 ENCOUNTER — Encounter (HOSPITAL_COMMUNITY): Payer: Self-pay | Admitting: *Deleted

## 2017-10-08 DIAGNOSIS — F17210 Nicotine dependence, cigarettes, uncomplicated: Secondary | ICD-10-CM | POA: Diagnosis not present

## 2017-10-08 DIAGNOSIS — N898 Other specified noninflammatory disorders of vagina: Secondary | ICD-10-CM | POA: Diagnosis present

## 2017-10-08 DIAGNOSIS — N760 Acute vaginitis: Secondary | ICD-10-CM

## 2017-10-08 LAB — POCT URINALYSIS DIP (DEVICE)
Bilirubin Urine: NEGATIVE
Glucose, UA: NEGATIVE mg/dL
Hgb urine dipstick: NEGATIVE
KETONES UR: NEGATIVE mg/dL
Leukocytes, UA: NEGATIVE
Nitrite: NEGATIVE
PH: 7 (ref 5.0–8.0)
PROTEIN: NEGATIVE mg/dL
Specific Gravity, Urine: 1.025 (ref 1.005–1.030)
Urobilinogen, UA: 0.2 mg/dL (ref 0.0–1.0)

## 2017-10-08 MED ORDER — METRONIDAZOLE 500 MG PO TABS: 500.0000 mg | ORAL_TABLET | Freq: Two times a day (BID) | ORAL | 0 refills | Status: AC

## 2017-10-08 NOTE — ED Provider Notes (Signed)
[place]    CSN: [id number] Arrival date & time: 10/08/17  1008     History   Chief Complaint Chief Complaint  Patient presents with  . Urinary Tract Infection    HPI Mallory Wall is a 56 y.o. female.   Mallory presents with complaints of vaginal discharge and itching. This started 12/8. Discharge has been white. States she has had BV in the past, uncertain if this feels similar. She states she has been taking probiotics. She has not been sexually active in 5 months. She has intermittent pelvic pain, rates it 2/10, comes and goes. Denies urinary symptoms of frequency or burning. Has been under increased stress recently. No changes to bowel movements but has decreased appetite. Without fevers.    ROS per HPI.       Past Medical History:  Diagnosis Date  . Allergy   . Back pain   . Headache(784.0)   . Hyperlipidemia   . Hypertension   . Vaginal Pap smear, abnormal     Patient Active Problem List   Diagnosis Date Noted  . Dysplasia of cervix, low grade (CIN 1) 03/25/2016  . Elevated BP 01/30/2014  . Bacterial vaginosis 10/31/2013  . DDD (degenerative disc disease), lumbar 03/18/2013  . History of pyelonephritis 03/18/2013  . Prediabetes 03/18/2013  . Dyslipidemia 03/18/2013    History reviewed. No pertinent surgical history.  OB History    Gravida Para Term Preterm AB Living   1       1     SAB TAB Ectopic Multiple Live Births     1             Home Medications    Prior to Admission medications   Medication Sig Start Date End Date Taking? Authorizing Provider  metroNIDAZOLE (FLAGYL) 500 MG tablet Take 1 tablet (500 mg total) by mouth 2 (two) times daily for 7 days. 10/08/17 10/15/17  Zigmund Gottron, NP    Family History Family History  Problem Relation Age of Onset  . CAD Mother   . Hypertension Mother   . Diabetes type II Sister   . Colon cancer Neg Hx   . Esophageal cancer Neg Hx   . Pancreatic cancer Neg Hx   . Rectal  cancer Neg Hx   . Stomach cancer Neg Hx     Social History Social History   Tobacco Use  . Smoking status: Current Every Day Smoker    Packs/day: 0.50    Types: Cigarettes  . Smokeless tobacco: Never Used  Substance Use Topics  . Alcohol use: No  . Drug use: No     Allergies   Patient has no known allergies.   Review of Systems Review of Systems   Physical Exam Triage Vital Signs ED Triage Vitals  Enc Vitals Group     BP 10/08/17 1046 137/89     Pulse --      Resp --      Temp 10/08/17 1046 98.6 F (37 C)     Temp Source 10/08/17 1046 Oral     SpO2 10/08/17 1046 100 %     Weight --      Height --      Head Circumference --      Peak Flow --      Pain Score 10/08/17 1043 2     Pain Loc --      Pain Edu? --      Excl. in [place]? --  No data found.  Updated Vital Signs BP 137/89 (BP Location: Left Arm)   Temp 98.6 F (37 C) (Oral)   SpO2 100%   Visual Acuity Right Eye Distance:   Left Eye Distance:   Bilateral Distance:    Right Eye Near:   Left Eye Near:    Bilateral Near:     Physical Exam  Constitutional: She is oriented to person, place, and time. She appears well-developed and well-nourished. No distress.  Cardiovascular: Normal rate, regular rhythm and normal heart sounds.  Pulmonary/Chest: Effort normal and breath sounds normal.  Abdominal: There is tenderness in the suprapubic area. There is no CVA tenderness, no tenderness at McBurney's point and negative Murphy's sign.  Mild suprapubic pain with deep palpation   Genitourinary: Cervix exhibits no motion tenderness and no friability. Right adnexum displays no mass and no tenderness. Left adnexum displays no mass and no tenderness. No tenderness in the vagina. Vaginal discharge found.  Genitourinary Comments: Thin white vaginal discharge noted  Neurological: She is alert and oriented to person, place, and time.  Skin: Skin is warm and dry.     UC Treatments / Results  Labs (all labs  ordered are listed, but only abnormal results are displayed) Labs Reviewed  POCT URINALYSIS DIP (DEVICE)  CERVICOVAGINAL ANCILLARY ONLY    EKG  EKG Interpretation None       Radiology No results found.  Procedures Procedures (including critical care time)  Medications Ordered in UC Medications - No data to display   Initial Impression / Assessment and Plan / UC Course  I have reviewed the triage vital signs and the nursing notes.  Pertinent labs & imaging results that were available during my care of the patient were reviewed by me and considered in my medical decision making (see chart for details).     BV treatment initiated at this time, complete course. Without from intercourse until medication complete. Return precautions provided. Will notify of any positive findings and if any changes to treatment are needed.  Patient verbalized understanding and agreeable to plan.  If symptoms worsen or do not improve in the next week to return to be seen or to follow up with PCP.    Final Clinical Impressions(s) / UC Diagnoses   Final diagnoses:  Acute vaginitis    ED Discharge Orders        Ordered    metroNIDAZOLE (FLAGYL) 500 MG tablet  2 times daily     10/08/17 1112       Controlled Substance Prescriptions [place] Controlled Substance Registry consulted? Not Applicable   Zigmund Gottron, NP 10/08/17 1116

## 2017-10-08 NOTE — ED Triage Notes (Addendum)
Per pt she is having discharge and itching, per pt it started last Saturday, per pt this is casing her to get nauseous, right pelvic pain

## 2017-10-08 NOTE — Discharge Instructions (Signed)
Will start treatment for BV today and will notify you of any positive findings and if any changes to treatment are needed.   Continue to follow up with your primary care provider or with gynecology as needed if symptoms persist. If worsen return to urgent care or ed as needed.

## 2017-10-09 LAB — CERVICOVAGINAL ANCILLARY ONLY
Bacterial vaginitis: POSITIVE — AB
CANDIDA VAGINITIS: NEGATIVE
Chlamydia: NEGATIVE
Neisseria Gonorrhea: NEGATIVE
Trichomonas: NEGATIVE

## 2017-12-10 ENCOUNTER — Emergency Department (HOSPITAL_COMMUNITY)
Admission: EM | Admit: 2017-12-10 | Discharge: 2017-12-10 | Disposition: A | Discharge status: Home / Self Care | Payer: BLUE CROSS/BLUE SHIELD | Attending: Emergency Medicine | Admitting: Emergency Medicine

## 2017-12-10 ENCOUNTER — Other Ambulatory Visit: Payer: Self-pay

## 2017-12-10 ENCOUNTER — Emergency Department (HOSPITAL_COMMUNITY): Payer: BLUE CROSS/BLUE SHIELD

## 2017-12-10 ENCOUNTER — Encounter (HOSPITAL_COMMUNITY): Payer: Self-pay | Admitting: Emergency Medicine

## 2017-12-10 DIAGNOSIS — R10811 Right upper quadrant abdominal tenderness: Secondary | ICD-10-CM

## 2017-12-10 DIAGNOSIS — K8020 Calculus of gallbladder without cholecystitis without obstruction: Secondary | ICD-10-CM

## 2017-12-10 DIAGNOSIS — R1013 Epigastric pain: Secondary | ICD-10-CM | POA: Diagnosis present

## 2017-12-10 DIAGNOSIS — F17210 Nicotine dependence, cigarettes, uncomplicated: Secondary | ICD-10-CM | POA: Insufficient documentation

## 2017-12-10 DIAGNOSIS — I10 Essential (primary) hypertension: Secondary | ICD-10-CM | POA: Diagnosis not present

## 2017-12-10 LAB — HEPATIC FUNCTION PANEL
ALBUMIN: 3.8 g/dL (ref 3.5–5.0)
ALT: 39 U/L (ref 14–54)
AST: 90 U/L — ABNORMAL HIGH (ref 15–41)
Alkaline Phosphatase: 97 U/L (ref 38–126)
Bilirubin, Direct: <0.1 mg/dL — ABNORMAL LOW (ref 0.1–0.5)
TOTAL PROTEIN: 7.5 g/dL (ref 6.5–8.1)
Total Bilirubin: 0.5 mg/dL (ref 0.3–1.2)

## 2017-12-10 LAB — BASIC METABOLIC PANEL
ANION GAP: 12 (ref 5–15)
BUN: 10 mg/dL (ref 6–20)
CALCIUM: 9 mg/dL (ref 8.9–10.3)
CO2: 25 mmol/L (ref 22–32)
Chloride: 103 mmol/L (ref 101–111)
Creatinine, Ser: 0.52 mg/dL (ref 0.44–1.00)
Glucose, Bld: 114 mg/dL — ABNORMAL HIGH (ref 65–99)
POTASSIUM: 3.5 mmol/L (ref 3.5–5.1)
Sodium: 140 mmol/L (ref 135–145)

## 2017-12-10 LAB — I-STAT BETA HCG BLOOD, ED (MC, WL, AP ONLY): I-stat hCG, quantitative: 5.4 m[IU]/mL — ABNORMAL HIGH (ref <5)

## 2017-12-10 LAB — CBC
HEMATOCRIT: 39.1 % (ref 36.0–46.0)
HEMOGLOBIN: 12.9 g/dL (ref 12.0–15.0)
MCH: 30 pg (ref 26.0–34.0)
MCHC: 33 g/dL (ref 30.0–36.0)
MCV: 90.9 fL (ref 78.0–100.0)
Platelets: 371 10*3/uL (ref 150–400)
RBC: 4.3 MIL/uL (ref 3.87–5.11)
RDW: 15.2 % (ref 11.5–15.5)
WBC: 7.6 10*3/uL (ref 4.0–10.5)

## 2017-12-10 LAB — I-STAT TROPONIN, ED
TROPONIN I, POC: 0 ng/mL (ref 0.00–0.08)
TROPONIN I, POC: 0 ng/mL (ref 0.00–0.08)

## 2017-12-10 LAB — LIPASE, BLOOD: LIPASE: 26 U/L (ref 11–51)

## 2017-12-10 LAB — PREGNANCY, URINE: PREG TEST UR: NEGATIVE

## 2017-12-10 NOTE — ED Triage Notes (Signed)
Pt states she is having 4/10 mid cp going down to her abd with nausea not vomiting.

## 2017-12-10 NOTE — ED Provider Notes (Signed)
[facility] EMERGENCY DEPARTMENT Provider Note   CSN: [id number] Arrival date & time: 12/10/17  0424     History   Chief Complaint Chief Complaint  Patient presents with  . Abdominal Pain  . Chest Pain    HPI Mallory Wall is a 57 y.o. female.  HPI   Mallory Wall is a 57 y.o. female, with a history of HTN and hyperlipidemia, presenting to the ED with epigastric pain that came on around 2am this morning. States she got up to go to the bathroom and began to have pain in the epigastric region, 8/10 at onset, "felt like gas," radiating into the central chest, across the upper abdomen, and into the left axilla. Continued for a few hours.  No change with change in position, rest, or exertion. No pain currently. Has not experienced this pain before. Accompanied by nausea, diaphoresis, and lightheadedness. Small BM this morning, but last normal, full BM was yesterday.   Denies urinary symptoms, fever/chills, vomiting, diarrhea, shortness of breath, peripheral edema, hematochezia/melena, or any other complaints.     Past Medical History:  Diagnosis Date  . Allergy   . Back pain   . Headache(784.0)   . Hyperlipidemia   . Hypertension   . Vaginal Pap smear, abnormal     Patient Active Problem List   Diagnosis Date Noted  . Dysplasia of cervix, low grade (CIN 1) 03/25/2016  . Elevated BP 01/30/2014  . Bacterial vaginosis 10/31/2013  . DDD (degenerative disc disease), lumbar 03/18/2013  . History of pyelonephritis 03/18/2013  . Prediabetes 03/18/2013  . Dyslipidemia 03/18/2013    History reviewed. No pertinent surgical history.  OB History    Gravida Para Term Preterm AB Living   1       1     SAB TAB Ectopic Multiple Live Births     1             Home Medications    Prior to Admission medications   Not on File    Family History Family History  Problem Relation Age of Onset  . CAD Mother   . Hypertension Mother   . Diabetes type II  Sister   . Colon cancer Neg Hx   . Esophageal cancer Neg Hx   . Pancreatic cancer Neg Hx   . Rectal cancer Neg Hx   . Stomach cancer Neg Hx     Social History Social History   Tobacco Use  . Smoking status: Current Every Day Smoker    Packs/day: 0.50    Types: Cigarettes  . Smokeless tobacco: Never Used  Substance Use Topics  . Alcohol use: No  . Drug use: No     Allergies   Patient has no known allergies.   Review of Systems Review of Systems  Constitutional: Positive for diaphoresis (resolved). Negative for chills and fever.  Respiratory: Negative for cough and shortness of breath.   Cardiovascular: Positive for chest pain. Negative for leg swelling.  Gastrointestinal: Positive for abdominal pain and nausea. Negative for blood in stool, diarrhea and vomiting.  Genitourinary: Negative for dysuria, frequency and hematuria.  Musculoskeletal: Negative for back pain.  Neurological: Positive for light-headedness (resolved). Negative for syncope.  All other systems reviewed and are negative.    Physical Exam Updated Vital Signs BP 126/79 (BP Location: Right Arm)   Pulse 84   Temp 97.8 F (36.6 C) (Oral)   Resp 18   Ht 5\' 2"  (1.575 m)   Wt 70.3  kg (155 lb)   SpO2 100%   BMI 28.35 kg/m   Physical Exam  Constitutional: She appears well-developed and well-nourished. No distress.  HENT:  Head: Normocephalic and atraumatic.  Eyes: Conjunctivae are normal.  Neck: Neck supple.  Cardiovascular: Normal rate, regular rhythm, normal heart sounds and intact distal pulses.  Pulmonary/Chest: Effort normal and breath sounds normal. No respiratory distress.  Abdominal: Soft. There is tenderness (minor) in the right upper quadrant. There is no guarding.  Musculoskeletal: She exhibits no edema.  Lymphadenopathy:    She has no cervical adenopathy.  Neurological: She is alert.  Skin: Skin is warm and dry. She is not diaphoretic.  Psychiatric: She has a normal mood and affect.  Her behavior is normal.  Nursing note and vitals reviewed.    ED Treatments / Results  Labs (all labs ordered are listed, but only abnormal results are displayed) Labs Reviewed  BASIC METABOLIC PANEL - Abnormal; Notable for the following components:      Result Value   Glucose, Bld 114 (*)    All other components within normal limits  HEPATIC FUNCTION PANEL - Abnormal; Notable for the following components:   AST 90 (*)    Bilirubin, Direct <0.1 (*)    All other components within normal limits  I-STAT BETA HCG BLOOD, ED ([facility], [facility], [facility] ONLY) - Abnormal; Notable for the following components:   I-stat hCG, quantitative 5.4 (*)    All other components within normal limits  CBC  PREGNANCY, URINE  LIPASE, BLOOD  I-STAT TROPONIN, ED  I-STAT TROPONIN, ED    EKG  EKG Interpretation  Date/Time:  Thursday December 10 2017 04:29:55 EST Ventricular Rate:  86 PR Interval:  164 QRS Duration: 76 QT Interval:  380 QTC Calculation: 454 R Axis:   14 Text Interpretation:  Normal sinus rhythm Moderate voltage criteria for LVH, may be normal variant Borderline ECG No old tracing to compare Confirmed by Delora Fuel ([id number]) on 12/10/2017 6:22:59 AM Also confirmed by Delora Fuel ([id number]), editor Hattie Perch [phone])  on 12/10/2017 6:55:36 AM       EKG Interpretation  Date/Time:  Thursday December 10 2017 10:31:18 EST Ventricular Rate:  65 PR Interval:  164 QRS Duration: 82 QT Interval:  419 QTC Calculation: 436 R Axis:   2 Text Interpretation:  Sinus rhythm Abnormal R-wave progression, early transition Left ventricular hypertrophy Confirmed by Quintella Reichert [phone]) on 12/10/2017 2:38:15 PM       Radiology Dg Chest 2 View  Result Date: 12/10/2017 CLINICAL DATA:  Mid chest and upper abdominal pain tonight. EXAM: CHEST  2 VIEW COMPARISON:  Chest radiograph November 06, 2016 FINDINGS: Cardiomediastinal silhouette is normal. No pleural effusions or focal consolidations. Trachea projects  midline and there is no pneumothorax. Soft tissue planes and included osseous structures are non-suspicious. Moderate degenerative change of the mid thoracic spine. IMPRESSION: Stable examination: No acute cardiopulmonary process. Electronically Signed   By: Elon Alas M.D.   On: 12/10/2017 05:08   US Abdomen Limited Ruq  Result Date: 12/10/2017 CLINICAL DATA:  Right upper quadrant tenderness. EXAM: ULTRASOUND ABDOMEN LIMITED RIGHT UPPER QUADRANT COMPARISON:  No recent. FINDINGS: Gallbladder: Multiple gallstones measuring up to 1.3 cm. Gallbladder wall thickness 2.7 mm. Negative Murphy sign. Common bile duct: Diameter: 5.6 mm Liver: No focal lesion identified. Within normal limits in parenchymal echogenicity. Portal vein is patent on color Doppler imaging with normal direction of blood flow towards the liver. IMPRESSION: Multiple gallstones. No evidence of cholecystitis or biliary distention.  Electronically Signed   By: Marcello Moores  Register   On: 12/10/2017 09:56    Procedures Procedures (including critical care time)  Medications Ordered in ED Medications - No data to display   Initial Impression / Assessment and Plan / ED Course  I have reviewed the triage vital signs and the nursing notes.  Pertinent labs & imaging results that were available during my care of the patient were reviewed by me and considered in my medical decision making (see chart for details).      Patient presents with an instance of upper abdominal pain. HEART score is 4, indicating moderate risk for a cardiac event. Wells criteria score is 0, indicating low risk for PE.  Pain-free throughout ED course. Patient is nontoxic appearing, afebrile, not tachycardic, not tachypneic, not hypotensive, maintains excellent SPO2 on room air, and is in no apparent distress.  Delta troponins negative.  Chest x-ray without acute abnormality. I have a suspicion for biliary origin of the patient's pain.  For this reason, she will  follow-up with the general surgeon. Though my suspicion for cardiac origin is less, I will have the patient follow-up with the cardiologist on an outpatient basis. The patient was given instructions for home care as well as return precautions. Patient voices understanding of these instructions, accepts the plan, and is comfortable with discharge.  Findings and plan of care discussed with Quintella Reichert, MD. Dr. Ralene Bathe personally evaluated and examined this patient.  Vitals:   12/10/17 1045 12/10/17 1100 12/10/17 1115 12/10/17 1130  BP: 117/88   117/81  Pulse: 89 70 68 70  Resp: (!) 22 (!) 26 (!) 24 (!) 23  Temp:      TempSrc:      SpO2: 94% 100% 100% 100%  Weight:      Height:         Final Clinical Impressions(s) / ED Diagnoses   Final diagnoses:  RUQ abdominal tenderness  Calculus of gallbladder without cholecystitis without obstruction    ED Discharge Orders    None       Layla Maw 12/10/17 1626    Quintella Reichert, MD 12/11/17 [phone]

## 2017-12-10 NOTE — Discharge Instructions (Signed)
There was evidence of gallbladder stones on the ultrasound, but no other abnormalities were found.  Your lab results were reassuring.  Follow-up with the general surgeon on this matter.  Should you have recurrences of the chest pain, please follow-up with the cardiologist.  Return to the ED for worsening symptoms.

## 2017-12-29 ENCOUNTER — Other Ambulatory Visit: Payer: Self-pay | Admitting: Surgery

## 2018-01-08 ENCOUNTER — Encounter (HOSPITAL_BASED_OUTPATIENT_CLINIC_OR_DEPARTMENT_OTHER): Payer: Self-pay | Admitting: *Deleted

## 2018-01-08 ENCOUNTER — Other Ambulatory Visit: Payer: Self-pay

## 2018-01-12 NOTE — H&P (Signed)
Mallory Wall Documented: 12/29/2017 9:20 AM Location: [facility] Patient #: [id number] DOB: Mar 29, 1961 Single / Language: Mallory Wall / Race: Black or African American Female   History of Present Illness (Mallory Wall A. Ninfa Linden MD; 12/29/2017 9:34 AM) The patient is a 57 year old female who presents for evaluation of gall stones. This patient has been referred by the emergency department for evaluation of abdominal pain. Several weeks ago, she had an attack of sharp epigastric abdominal pain hurting through to her back. She described it as severe in intensity. She had nausea and diaphoresis with this. It also hurt up into her chest in the right upper quadrant. She had no emesis. She has had no previous similar attacks. She presented to the emergency department. She had an ultrasound showing multiple gallstones with the largest being 1.3 cm in size. Her liver function tests and CBC were normal. There was no dilation of the bile duct. She improved from a pain standpoint after several hours in the emergency department and was discharged home. Since then, she has had one smaller attack of pain after a fatty meal. She is otherwise healthy without complaint   Past Surgical History Mallory Wall, CMA; 12/29/2017 9:20 AM) Colon Removal - Complete   Diagnostic Studies History Mallory Wall, CMA; 12/29/2017 9:20 AM) Colonoscopy  1-5 years ago Mammogram  1-3 years ago Pap Smear  >5 years ago  Allergies Mallory Wall, CMA; 12/29/2017 9:21 AM) No Known Drug Allergies [12/29/2017]:  Medication History Mallory Wall, CMA; 12/29/2017 9:21 AM) No Current Medications Medications Reconciled  Social History Mallory Wall, CMA; 12/29/2017 9:20 AM) Alcohol use  Occasional alcohol use. Caffeine use  Carbonated beverages, Tea. No drug use  Tobacco use  Current every day smoker.  Family History Mallory Wall, CMA; 12/29/2017 9:20 AM) Diabetes Mellitus  Sister. Hypertension   Mother. Respiratory Condition  Sister.  Pregnancy / Birth History Mallory Wall, CMA; 12/29/2017 9:20 AM) Age at menarche  41 years. Age of menopause  67-50 Gravida  0 Irregular periods  Para  0  Other Problems Mallory Wall, CMA; 12/29/2017 9:20 AM) Back Pain     Review of Systems (Mallory Wall CMA; 12/29/2017 9:20 AM) General Not Present- Appetite Loss, Chills, Fatigue, Fever, Night Sweats, Weight Gain and Weight Loss. Skin Not Present- Change in Wart/Mole, Dryness, Hives, Jaundice, New Lesions, Non-Healing Wounds, Rash and Ulcer. HEENT Present- Nose Bleed and Wears glasses/contact lenses. Not Present- Earache, Hearing Loss, Hoarseness, Oral Ulcers, Ringing in the Ears, Seasonal Allergies, Sinus Pain, Sore Throat, Visual Disturbances and Yellow Eyes. Respiratory Not Present- Bloody sputum, Chronic Cough, Difficulty Breathing, Snoring and Wheezing. Breast Not Present- Breast Mass, Breast Pain, Nipple Discharge and Skin Changes. Cardiovascular Present- Swelling of Extremities. Not Present- Chest Pain, Difficulty Breathing Lying Down, Leg Cramps, Palpitations, Rapid Heart Rate and Shortness of Breath. Gastrointestinal Not Present- Abdominal Pain, Bloating, Bloody Stool, Change in Bowel Habits, Chronic diarrhea, Constipation, Difficulty Swallowing, Excessive gas, Gets full quickly at meals, Hemorrhoids, Indigestion, Nausea, Rectal Pain and Vomiting. Female Genitourinary Present- Pelvic Pain. Not Present- Frequency, Nocturia, Painful Urination and Urgency. Musculoskeletal Present- Back Pain. Not Present- Joint Pain, Joint Stiffness, Muscle Pain, Muscle Weakness and Swelling of Extremities. Neurological Present- Headaches. Not Present- Decreased Memory, Fainting, Numbness, Seizures, Tingling, Tremor, Trouble walking and Weakness. Psychiatric Present- Depression and Frequent crying. Not Present- Anxiety, Bipolar, Change in Sleep Pattern and Fearful. Endocrine Not Present- Cold Intolerance,  Excessive Hunger, Hair Changes, Heat Intolerance, Hot flashes and New Diabetes. Hematology Not Present- Blood Thinners, Easy Bruising,  Excessive bleeding, Gland problems, HIV and Persistent Infections.  Vitals (Mallory Wall CMA; 12/29/2017 9:21 AM) 12/29/2017 9:21 AM Weight: 158.6 lb Height: 62in Body Surface Area: 1.73 m Body Mass Index: 29.01 kg/m  Temp.: 99.84F(Oral)  Pulse: 87 (Regular)  BP: 114/72 (Sitting, Left Arm, Standard)       Physical Exam (Mallory Wall A. Ninfa Linden MD; 12/29/2017 9:34 AM) General Mental Status-Alert. General Appearance-Consistent with stated age. Hydration-Well hydrated. Voice-Normal.  Head and Neck Head-normocephalic, atraumatic with no lesions or palpable masses.  Eye Eyeball - Bilateral-Extraocular movements intact. Sclera/Conjunctiva - Bilateral-No scleral icterus.  Chest and Lung Exam Chest and lung exam reveals -quiet, even and easy respiratory effort with no use of accessory muscles and on auscultation, normal breath sounds, no adventitious sounds and normal vocal resonance. Inspection Chest Wall - Normal. Back - normal.  Cardiovascular Cardiovascular examination reveals -on palpation PMI is normal in location and amplitude, no palpable S3 or S4. Normal cardiac borders., normal heart sounds, regular rate and rhythm with no murmurs, carotid auscultation reveals no bruits and normal pedal pulses bilaterally.  Abdomen Inspection Inspection of the abdomen reveals - No Hernias. Skin - Scar - no surgical scars. Palpation/Percussion Palpation and Percussion of the abdomen reveal - Soft, Non Tender, No Rebound tenderness, No Rigidity (guarding) and No hepatosplenomegaly. Auscultation Auscultation of the abdomen reveals - Bowel sounds normal.  Neurologic Neurologic evaluation reveals -alert and oriented x 3 with no impairment of recent or remote memory. Mental Status-Normal.  Musculoskeletal Normal Exam -  Left-Upper Extremity Strength Normal and Lower Extremity Strength Normal. Normal Exam - Right-Upper Extremity Strength Normal, Lower Extremity Weakness.    Assessment & Plan (Mallory Wall A. Ninfa Linden MD; 12/29/2017 9:35 AM) SYMPTOMATIC CHOLELITHIASIS (K80.20) Impression: I discussed the diagnosis of gallstones with the patient in detail. I gave her literature regarding gallstones and gallbladder surgery. I recommended a laparoscopic cholecystectomy. We discussed the reasons for this in detail. I discussed the surgical procedure in detail. I discussed the risks which includes but is not limited to bleeding, infection, injury to surrounding structures, the need to convert to an open procedure, bile duct injury, bile leak, cardiopulmonary issues, DVT, postoperative recovery, etc. She understands and wished to proceed with surgery which will be scheduled.

## 2018-01-13 ENCOUNTER — Ambulatory Visit (HOSPITAL_BASED_OUTPATIENT_CLINIC_OR_DEPARTMENT_OTHER): Payer: BLUE CROSS/BLUE SHIELD | Admitting: Certified Registered"

## 2018-01-13 ENCOUNTER — Ambulatory Visit (HOSPITAL_BASED_OUTPATIENT_CLINIC_OR_DEPARTMENT_OTHER)
Admission: RE | Admit: 2018-01-13 | Discharge: 2018-01-13 | Disposition: A | Discharge status: Home / Self Care | Payer: BLUE CROSS/BLUE SHIELD | Source: Ambulatory Visit | Attending: Surgery | Admitting: Surgery

## 2018-01-13 ENCOUNTER — Encounter (HOSPITAL_BASED_OUTPATIENT_CLINIC_OR_DEPARTMENT_OTHER): Payer: Self-pay | Admitting: Certified Registered"

## 2018-01-13 ENCOUNTER — Other Ambulatory Visit: Payer: Self-pay

## 2018-01-13 ENCOUNTER — Encounter (HOSPITAL_BASED_OUTPATIENT_CLINIC_OR_DEPARTMENT_OTHER)
Admission: RE | Disposition: A | Discharge status: Home / Self Care | Payer: Self-pay | Source: Ambulatory Visit | Attending: Surgery

## 2018-01-13 DIAGNOSIS — F17200 Nicotine dependence, unspecified, uncomplicated: Secondary | ICD-10-CM | POA: Insufficient documentation

## 2018-01-13 DIAGNOSIS — K8010 Calculus of gallbladder with chronic cholecystitis without obstruction: Secondary | ICD-10-CM | POA: Diagnosis present

## 2018-01-13 HISTORY — DX: Calculus of gallbladder without cholecystitis without obstruction: K80.20

## 2018-01-13 HISTORY — PX: CHOLECYSTECTOMY: SHX55

## 2018-01-13 SURGERY — LAPAROSCOPIC CHOLECYSTECTOMY
Anesthesia: General | Site: Abdomen

## 2018-01-13 MED ORDER — OXYCODONE HCL 5 MG/5ML PO SOLN: 5.0000 mg | Freq: Once | ORAL | Status: AC | PRN

## 2018-01-13 MED ORDER — PROPOFOL 10 MG/ML IV BOLUS
INTRAVENOUS | Status: DC | PRN
  Administered 2018-01-13: 200 mg via INTRAVENOUS

## 2018-01-13 MED ORDER — OXYCODONE HCL 5 MG PO TABS
5.0000 mg | ORAL_TABLET | Freq: Once | ORAL | Status: AC | PRN
  Administered 2018-01-13: 5 mg via ORAL

## 2018-01-13 MED ORDER — FENTANYL CITRATE (PF) 100 MCG/2ML IJ SOLN
50.0000 ug | INTRAMUSCULAR | Status: DC | PRN
  Administered 2018-01-13: 100 ug via INTRAVENOUS
  Administered 2018-01-13: 25 ug via INTRAVENOUS

## 2018-01-13 MED ORDER — MIDAZOLAM HCL 2 MG/2ML IJ SOLN
INTRAMUSCULAR | Status: AC
  Filled 2018-01-13: qty 2

## 2018-01-13 MED ORDER — MIDAZOLAM HCL 2 MG/2ML IJ SOLN
1.0000 mg | INTRAMUSCULAR | Status: DC | PRN
  Administered 2018-01-13: 2 mg via INTRAVENOUS

## 2018-01-13 MED ORDER — OXYCODONE HCL 5 MG PO TABS
ORAL_TABLET | ORAL | Status: AC
  Filled 2018-01-13: qty 1

## 2018-01-13 MED ORDER — ONDANSETRON HCL 4 MG/2ML IJ SOLN
INTRAMUSCULAR | Status: DC | PRN
  Administered 2018-01-13: 4 mg via INTRAVENOUS

## 2018-01-13 MED ORDER — FENTANYL CITRATE (PF) 100 MCG/2ML IJ SOLN
INTRAMUSCULAR | Status: AC
  Filled 2018-01-13: qty 2

## 2018-01-13 MED ORDER — OXYCODONE HCL 5 MG PO TABS: 5.0000 mg | ORAL_TABLET | Freq: Four times a day (QID) | ORAL | 0 refills | Status: DC | PRN

## 2018-01-13 MED ORDER — LIDOCAINE HCL (CARDIAC) 20 MG/ML IV SOLN
INTRAVENOUS | Status: DC | PRN
  Administered 2018-01-13: 60 mg via INTRAVENOUS

## 2018-01-13 MED ORDER — ROCURONIUM BROMIDE 100 MG/10ML IV SOLN
INTRAVENOUS | Status: DC | PRN
  Administered 2018-01-13: 40 mg via INTRAVENOUS

## 2018-01-13 MED ORDER — LACTATED RINGERS IV SOLN
INTRAVENOUS | Status: DC
  Administered 2018-01-13 (×2): via INTRAVENOUS

## 2018-01-13 MED ORDER — CHLORHEXIDINE GLUCONATE CLOTH 2 % EX PADS: 6.0000 | MEDICATED_PAD | Freq: Once | CUTANEOUS | Status: DC

## 2018-01-13 MED ORDER — FENTANYL CITRATE (PF) 100 MCG/2ML IJ SOLN
25.0000 ug | INTRAMUSCULAR | Status: DC | PRN
  Administered 2018-01-13: 25 ug via INTRAVENOUS
  Administered 2018-01-13: 50 ug via INTRAVENOUS
  Administered 2018-01-13 (×2): 25 ug via INTRAVENOUS

## 2018-01-13 MED ORDER — SCOPOLAMINE 1 MG/3DAYS TD PT72: 1.0000 | MEDICATED_PATCH | Freq: Once | TRANSDERMAL | Status: DC | PRN

## 2018-01-13 MED ORDER — BUPIVACAINE-EPINEPHRINE (PF) 0.5% -1:200000 IJ SOLN
INTRAMUSCULAR | Status: DC | PRN
  Administered 2018-01-13: 20 mL

## 2018-01-13 MED ORDER — PROMETHAZINE HCL 25 MG/ML IJ SOLN: 6.2500 mg | INTRAMUSCULAR | Status: DC | PRN

## 2018-01-13 MED ORDER — SUGAMMADEX SODIUM 200 MG/2ML IV SOLN
INTRAVENOUS | Status: DC | PRN
  Administered 2018-01-13: 200 mg via INTRAVENOUS

## 2018-01-13 MED ORDER — KETOROLAC TROMETHAMINE 30 MG/ML IJ SOLN
INTRAMUSCULAR | Status: DC | PRN
  Administered 2018-01-13: 30 mg via INTRAVENOUS

## 2018-01-13 MED ORDER — MEPERIDINE HCL 25 MG/ML IJ SOLN: 6.2500 mg | INTRAMUSCULAR | Status: DC | PRN

## 2018-01-13 MED ORDER — DEXAMETHASONE SODIUM PHOSPHATE 4 MG/ML IJ SOLN
INTRAMUSCULAR | Status: DC | PRN
  Administered 2018-01-13: 10 mg via INTRAVENOUS

## 2018-01-13 MED ORDER — FENTANYL CITRATE (PF) 100 MCG/2ML IJ SOLN
INTRAMUSCULAR | Status: AC
Start: 2018-01-13 — End: ?
  Filled 2018-01-13: qty 2

## 2018-01-13 MED ORDER — CEFAZOLIN SODIUM-DEXTROSE 2-4 GM/100ML-% IV SOLN
2.0000 g | INTRAVENOUS | Status: AC
  Administered 2018-01-13: 2 g via INTRAVENOUS

## 2018-01-13 MED ORDER — BUPIVACAINE-EPINEPHRINE (PF) 0.5% -1:200000 IJ SOLN
INTRAMUSCULAR | Status: AC
  Filled 2018-01-13: qty 30

## 2018-01-13 MED ORDER — SODIUM CHLORIDE 0.9 % IR SOLN
Status: DC | PRN
  Administered 2018-01-13: 600 mL

## 2018-01-13 MED ORDER — CEFAZOLIN SODIUM-DEXTROSE 2-4 GM/100ML-% IV SOLN
INTRAVENOUS | Status: AC
  Filled 2018-01-13: qty 100

## 2018-01-13 SURGICAL SUPPLY — 44 items
ADH SKN CLS APL DERMABOND .7 (GAUZE/BANDAGES/DRESSINGS) ×2
APPLIER CLIP 5 13 M/L LIGAMAX5 (MISCELLANEOUS) ×3
APR CLP MED LRG 5 ANG JAW (MISCELLANEOUS) ×1
BAG SPEC RTRVL LRG 6X4 10 (ENDOMECHANICALS) ×1
BLADE CLIPPER SURG (BLADE) IMPLANT
CHLORAPREP W/TINT 26ML (MISCELLANEOUS) ×3 IMPLANT
CLIP APPLIE 5 13 M/L LIGAMAX5 (MISCELLANEOUS) ×1 IMPLANT
COVER MAYO STAND STRL (DRAPES) IMPLANT
DECANTER SPIKE VIAL GLASS SM (MISCELLANEOUS) ×1 IMPLANT
DERMABOND ADVANCED (GAUZE/BANDAGES/DRESSINGS) ×4
DERMABOND ADVANCED .7 DNX12 (GAUZE/BANDAGES/DRESSINGS) ×1 IMPLANT
DRAPE C-ARM 42X72 X-RAY (DRAPES) IMPLANT
ELECT REM PT RETURN 9FT ADLT (ELECTROSURGICAL) ×3
ELECTRODE REM PT RTRN 9FT ADLT (ELECTROSURGICAL) ×1 IMPLANT
FILTER SMOKE EVAC LAPAROSHD (FILTER) IMPLANT
GLOVE BIO SURGEON STRL SZ 6.5 (GLOVE) ×1 IMPLANT
GLOVE BIO SURGEONS STRL SZ 6.5 (GLOVE) ×1
GLOVE BIOGEL PI IND STRL 6.5 (GLOVE) IMPLANT
GLOVE BIOGEL PI IND STRL 7.0 (GLOVE) IMPLANT
GLOVE BIOGEL PI INDICATOR 6.5 (GLOVE) ×2
GLOVE BIOGEL PI INDICATOR 7.0 (GLOVE) ×2
GLOVE ECLIPSE 6.5 STRL STRAW (GLOVE) ×2 IMPLANT
GLOVE SURG SIGNA 7.5 PF LTX (GLOVE) ×3 IMPLANT
GOWN STRL REUS W/ TWL LRG LVL3 (GOWN DISPOSABLE) ×2 IMPLANT
GOWN STRL REUS W/ TWL XL LVL3 (GOWN DISPOSABLE) ×1 IMPLANT
GOWN STRL REUS W/TWL LRG LVL3 (GOWN DISPOSABLE) ×6
GOWN STRL REUS W/TWL XL LVL3 (GOWN DISPOSABLE) ×3
NS IRRIG 1000ML POUR BTL (IV SOLUTION) ×3 IMPLANT
PACK BASIN DAY SURGERY FS (CUSTOM PROCEDURE TRAY) ×3 IMPLANT
POUCH SPECIMEN RETRIEVAL 10MM (ENDOMECHANICALS) ×2 IMPLANT
SCISSORS LAP 5X35 DISP (ENDOMECHANICALS) ×2 IMPLANT
SET CHOLANGIOGRAPH 5 50 .035 (SET/KITS/TRAYS/PACK) IMPLANT
SET IRRIG TUBING LAPAROSCOPIC (IRRIGATION / IRRIGATOR) ×3 IMPLANT
SLEEVE ENDOPATH XCEL 5M (ENDOMECHANICALS) ×6 IMPLANT
SLEEVE SCD COMPRESS KNEE MED (MISCELLANEOUS) ×3 IMPLANT
SPECIMEN JAR SMALL (MISCELLANEOUS) ×1 IMPLANT
SUT MON AB 4-0 PC3 18 (SUTURE) ×3 IMPLANT
TOWEL OR 17X24 6PK STRL BLUE (TOWEL DISPOSABLE) ×3 IMPLANT
TRAY LAPAROSCOPIC (CUSTOM PROCEDURE TRAY) ×3 IMPLANT
TROCAR XCEL BLUNT TIP 100MML (ENDOMECHANICALS) ×3 IMPLANT
TROCAR XCEL NON-BLD 5MMX100MML (ENDOMECHANICALS) ×3 IMPLANT
TUBE CONNECTING 20'X1/4 (TUBING) ×1
TUBE CONNECTING 20X1/4 (TUBING) ×2 IMPLANT
TUBING INSUFFLATION (TUBING) ×3 IMPLANT

## 2018-01-13 NOTE — Anesthesia Procedure Notes (Signed)
Procedure Name: Intubation Date/Time: 01/13/2018 8:27 AM Performed by: Signe Colt, CRNA Pre-anesthesia Checklist: Patient identified, Emergency Drugs available, Suction available and Patient being monitored Patient Re-evaluated:Patient Re-evaluated prior to induction Oxygen Delivery Method: Circle system utilized Preoxygenation: Pre-oxygenation with 100% oxygen Induction Type: IV induction Ventilation: Mask ventilation without difficulty Laryngoscope Size: Mac and 3 Grade View: Grade I Tube type: Oral Tube size: 7.0 mm Number of attempts: 1 Airway Equipment and Method: Stylet and Oral airway Placement Confirmation: ETT inserted through vocal cords under direct vision,  positive ETCO2 and breath sounds checked- equal and bilateral Secured at: 21 cm Tube secured with: Tape Dental Injury: Teeth and Oropharynx as per pre-operative assessment

## 2018-01-13 NOTE — Discharge Instructions (Signed)
[facility] ______[facility] LAPAROSCOPIC SURGERY: POST OP INSTRUCTIONS Always review your discharge instruction sheet given to you by the facility where your surgery was performed. IF YOU HAVE DISABILITY OR FAMILY LEAVE FORMS, YOU MUST BRING THEM TO THE OFFICE FOR PROCESSING.   DO NOT GIVE THEM TO YOUR DOCTOR.  1. A prescription for pain medication may be given to you upon discharge.  Take your pain medication as prescribed, if needed.  If narcotic pain medicine is not needed, then you may take acetaminophen (Tylenol) or ibuprofen (Advil) as needed. 2. Take your usually prescribed medications unless otherwise directed. 3. If you need a refill on your pain medication, please contact your pharmacy.  They will contact our office to request authorization. Prescriptions will not be filled after 5pm or on week-ends. 4. You should follow a light diet the first few days after arrival home, such as soup and crackers, etc.  Be sure to include lots of fluids daily. 5. Most patients will experience some swelling and bruising in the area of the incisions.  Ice packs will help.  Swelling and bruising can take several days to resolve.  6. It is common to experience some constipation if taking pain medication after surgery.  Increasing fluid intake and taking a stool softener (such as Colace) will usually help or prevent this problem from occurring.  A mild laxative (Milk of Magnesia or Miralax) should be taken according to package instructions if there are no bowel movements after 48 hours. 7. Unless discharge instructions indicate otherwise, you may remove your bandages 24-48 hours after surgery, and you may shower at that time.  You may have steri-strips (small skin tapes) in place directly over the incision.  These strips should be left on the skin for 7-10 days.  If your surgeon used skin glue on the incision, you may shower in 24 hours.  The glue will flake off over the next 2-3 weeks.  Any sutures or  staples will be removed at the office during your follow-up visit. 8. ACTIVITIES:  You may resume regular (light) daily activities beginning the next day--such as daily self-care, walking, climbing stairs--gradually increasing activities as tolerated.  You may have sexual intercourse when it is comfortable.  Refrain from any heavy lifting or straining until approved by your doctor. a. You may drive when you are no longer taking prescription pain medication, you can comfortably wear a seatbelt, and you can safely maneuver your car and apply brakes. b. RETURN TO WORK:  __________________________________________________________ 9. You should see your doctor in the office for a follow-up appointment approximately 2-3 weeks after your surgery.  Make sure that you call for this appointment within a day or two after you arrive home to insure a convenient appointment time. 10. OTHER INSTRUCTIONS:OK TO SHOWER STARTING TOMORROW 11. ICE PACK, TYLENOL, AND IBUPROFEN ALSO FOR PAIN 12. NO LIFTING MORE THAN 15 POUNDS FOR 2 WEEKS __________________________________________________________________________________________________________________________ __________________________________________________________________________________________________________________________ WHEN TO CALL YOUR DOCTOR: 1. Fever over 101.0 2. Inability to urinate 3. Continued bleeding from incision. 4. Increased pain, redness, or drainage from the incision. 5. Increasing abdominal pain  The clinic staff is available to answer your questions during regular business hours.  Please don't hesitate to call and ask to speak to one of the nurses for clinical concerns.  If you have a medical emergency, go to the nearest emergency room or call 911.  A surgeon from [facility] is always on call at the hospital. [address], [address],   [place], [place]  [address] ? [address], [place], [place]   [address] [phone] ?  [phone] ? FAX [phone] Web site: [email or web address]        Post Anesthesia Home Care Instructions  Activity: Get plenty of rest for the remainder of the day. A responsible individual must stay with you for 24 hours following the procedure.  For the next 24 hours, DO NOT: -Drive a car -Paediatric nurse -Drink alcoholic beverages -Take any medication unless instructed by your physician -Make any legal decisions or sign important papers.  Meals: Start with liquid foods such as gelatin or soup. Progress to regular foods as tolerated. Avoid greasy, spicy, heavy foods. If nausea and/or vomiting occur, drink only clear liquids until the nausea and/or vomiting subsides. Call your physician if vomiting continues.  Special Instructions/Symptoms: Your throat may feel dry or sore from the anesthesia or the breathing tube placed in your throat during surgery. If this causes discomfort, gargle with warm salt water. The discomfort should disappear within 24 hours.  If you had a scopolamine patch placed behind your ear for the management of post- operative nausea and/or vomiting:  1. The medication in the patch is effective for 72 hours, after which it should be removed.  Wrap patch in a tissue and discard in the trash. Wash hands thoroughly with soap and water. 2. You may remove the patch earlier than 72 hours if you experience unpleasant side effects which may include dry mouth, dizziness or visual disturbances. 3. Avoid touching the patch. Wash your hands with soap and water after contact with the patch.  Oxycodone given at 1115, next dose due at 5pm.

## 2018-01-13 NOTE — Op Note (Signed)

## 2018-01-13 NOTE — Anesthesia Preprocedure Evaluation (Signed)
Anesthesia Evaluation  Patient identified by MRN, date of birth, ID band Patient awake    Reviewed: Allergy & Precautions, NPO status , Patient's Chart, lab work & pertinent test results  Airway Mallampati: II  TM Distance: >3 FB Neck ROM: Full    Dental no notable dental hx.    Pulmonary neg pulmonary ROS, Current Smoker,    Pulmonary exam normal breath sounds clear to auscultation       Cardiovascular negative cardio ROS Normal cardiovascular exam Rhythm:Regular Rate:Normal     Neuro/Psych negative neurological ROS  negative psych ROS   GI/Hepatic negative GI ROS, Neg liver ROS,   Endo/Other  negative endocrine ROS  Renal/GU negative Renal ROS     Musculoskeletal  (+) Arthritis ,   Abdominal   Peds  Hematology negative hematology ROS (+)   Anesthesia Other Findings   Reproductive/Obstetrics negative OB ROS                             Anesthesia Physical Anesthesia Plan  ASA: II  Anesthesia Plan: General   Post-op Pain Management:    Induction: Intravenous  PONV Risk Score and Plan: 3 and Ondansetron, Dexamethasone, Midazolam and Scopolamine patch - Pre-op  Airway Management Planned: Oral ETT  Additional Equipment:   Intra-op Plan:   Post-operative Plan: Extubation in OR  Informed Consent: I have reviewed the patients History and Physical, chart, labs and discussed the procedure including the risks, benefits and alternatives for the proposed anesthesia with the patient or authorized representative who has indicated his/her understanding and acceptance.   Dental advisory given  Plan Discussed with: CRNA  Anesthesia Plan Comments:         Anesthesia Quick Evaluation

## 2018-01-13 NOTE — Anesthesia Postprocedure Evaluation (Signed)
Anesthesia Post Note  Patient: Mallory Wall  Procedure(s) Performed: LAPAROSCOPIC CHOLECYSTECTOMY (N/A Abdomen)     Patient location during evaluation: PACU Anesthesia Type: General Level of consciousness: sedated and patient cooperative Pain management: pain level controlled Vital Signs Assessment: post-procedure vital signs reviewed and stable Respiratory status: spontaneous breathing Cardiovascular status: stable Anesthetic complications: no    Last Vitals:  Vitals:   01/13/18 0945 01/13/18 1000  BP: 132/78 127/89  Pulse: 70 71  Resp: 13 14  Temp:    SpO2: 100% 100%    Last Pain:  Vitals:   01/13/18 1000  TempSrc:   PainSc: [place]

## 2018-01-13 NOTE — Interval H&P Note (Signed)
History and Physical Interval Note: no change in H and P 01/13/2018 8:05 AM  Mallory Wall  has presented today for surgery, with the diagnosis of SYMPTOMATIC GALLSTONES  The various methods of treatment have been discussed with the patient and family. After consideration of risks, benefits and other options for treatment, the patient has consented to  Procedure(s): LAPAROSCOPIC CHOLECYSTECTOMY (N/A) as a surgical intervention .  The patient's history has been reviewed, patient examined, no change in status, stable for surgery.  I have reviewed the patient's chart and labs.  Questions were answered to the patient's satisfaction.     Andreia Gandolfi A

## 2018-01-13 NOTE — Transfer of Care (Signed)
Immediate Anesthesia Transfer of Care Note  Patient: Mallory Wall  Procedure(s) Performed: LAPAROSCOPIC CHOLECYSTECTOMY (N/A Abdomen)  Patient Location: PACU  Anesthesia Type:General  Level of Consciousness: awake, alert , oriented and patient cooperative  Airway & Oxygen Therapy: Patient Spontanous Breathing and Patient connected to face mask oxygen  Post-op Assessment: Report given to RN and Post -op Vital signs reviewed and stable  Post vital signs: Reviewed and stable  Last Vitals:  Vitals:   01/13/18 0738  BP: 123/74  Pulse: 72  Resp: 18  Temp: 36.8 C  SpO2: 100%    Last Pain:  Vitals:   01/13/18 0738  TempSrc: Oral      Patients Stated Pain Goal: 0 (56/15/37 9432)  Complications: No apparent anesthesia complications

## 2018-01-14 ENCOUNTER — Encounter (HOSPITAL_BASED_OUTPATIENT_CLINIC_OR_DEPARTMENT_OTHER): Payer: Self-pay | Admitting: Surgery

## 2018-04-30 ENCOUNTER — Encounter: Payer: Self-pay | Admitting: Nurse Practitioner

## 2018-04-30 ENCOUNTER — Ambulatory Visit: Payer: BLUE CROSS/BLUE SHIELD | Attending: Nurse Practitioner | Admitting: Nurse Practitioner

## 2018-04-30 VITALS — BP 111/81 | HR 89 | Temp 99.1°F | Ht 62.0 in | Wt 156.8 lb

## 2018-04-30 DIAGNOSIS — N760 Acute vaginitis: Secondary | ICD-10-CM | POA: Diagnosis not present

## 2018-04-30 DIAGNOSIS — M545 Low back pain, unspecified: Secondary | ICD-10-CM

## 2018-04-30 DIAGNOSIS — E119 Type 2 diabetes mellitus without complications: Secondary | ICD-10-CM | POA: Diagnosis not present

## 2018-04-30 DIAGNOSIS — Z1231 Encounter for screening mammogram for malignant neoplasm of breast: Secondary | ICD-10-CM

## 2018-04-30 DIAGNOSIS — Z0000 Encounter for general adult medical examination without abnormal findings: Secondary | ICD-10-CM | POA: Diagnosis present

## 2018-04-30 DIAGNOSIS — R7303 Prediabetes: Secondary | ICD-10-CM

## 2018-04-30 DIAGNOSIS — G8929 Other chronic pain: Secondary | ICD-10-CM

## 2018-04-30 DIAGNOSIS — R5383 Other fatigue: Secondary | ICD-10-CM | POA: Diagnosis not present

## 2018-04-30 DIAGNOSIS — E785 Hyperlipidemia, unspecified: Secondary | ICD-10-CM | POA: Insufficient documentation

## 2018-04-30 LAB — POCT GLYCOSYLATED HEMOGLOBIN (HGB A1C): Hemoglobin A1C: 6.6 % — AB (ref 4.0–5.6)

## 2018-04-30 LAB — GLUCOSE, POCT (MANUAL RESULT ENTRY): POC GLUCOSE: 124 mg/dL — AB (ref 70–99)

## 2018-04-30 MED ORDER — SIMVASTATIN 20 MG PO TABS: 20.0000 mg | ORAL_TABLET | Freq: Every day | ORAL | 3 refills | Status: DC

## 2018-04-30 MED ORDER — IBUPROFEN 600 MG PO TABS: 600.0000 mg | ORAL_TABLET | Freq: Three times a day (TID) | ORAL | 1 refills | Status: DC | PRN

## 2018-04-30 NOTE — Progress Notes (Signed)
Assessment & Plan:  Mallory Wall was seen today for establish care and vaginal discharge.  Diagnoses and all orders for this visit:  Controlled type 2 diabetes mellitus without complication, without long-term current use of insulin (HCC) -     Glucose (CBG) -     HgB A1c -     Microalbumin/Creatinine Ratio, Urine -     Ambulatory referral to Ophthalmology -     CMP14+EGFR Continue blood sugar control as discussed in office today, low carbohydrate diet, and regular physical exercise as tolerated, 150 minutes per week (30 min each day, 5 days per week, or 50 min 3 days per week). Annual eye exams and foot exams are recommended.  Hyperlipidemia LDL goal <70 -     simvastatin (ZOCOR) 20 MG tablet; Take 1 tablet (20 mg total) by mouth at bedtime. -     Lipid panel INSTRUCTIONS: Work on a low fat, heart healthy diet and participate in regular aerobic exercise program by working out at least 150 minutes per week. No fried foods. No junk foods, sodas, sugary drinks, unhealthy snacking, alcohol or smoking.    Breast cancer screening by mammogram -     MM 3D SCREEN BREAST BILATERAL; Future  Routine adult health maintenance -     Hepatitis C Antibody -     HIV antibody (with reflex)  Fatigue, unspecified type -     VITAMIN D 25 Hydroxy (Vit-D Deficiency, Fractures)  Chronic Midline Low Back Pain -     ibuprofen (ADVIL,MOTRIN) 600 MG tablet; Take 1 tablet (600 mg total) by mouth every 8 (eight) hours as needed.    Patient has been counseled on age-appropriate routine health concerns for screening and prevention. These are reviewed and up-to-date. Referrals have been placed accordingly. Immunizations are up-to-date or declined.    Subjective:   Chief Complaint  Patient presents with  . Establish Care    Pt. is here to establish care. Pt. stated she have pain on her knees all the way to her thigh.   . Vaginal Discharge    Pt. stated she have discharge.    HPI Mallory Wall 56 y.o.  female presents to office today to re establish care. She has a history of DM Type 2 and elevated LDL.   Type 2 Diabetes Mellitus Disease course has been improving. There are no hypoglycemic symptoms. There are no hypoglycemic complications. Symptoms are stable. There are no diabetic complications. Risk factors for coronary artery disease include family history, dyslipidemia, diabetes mellitus, obesity, hypertension, sedentary lifestyle and stress. Current diabetic treatment includes controlled with diet .  Weight is  stable. Patient follows a generally healthy diet. Meal planning includes avoidance of concentrated sweets. Patient has not seen a dietician. Patient is not compliant with exercise.   An ACE inhibitor/angiotensin II receptor blocker is not being taken. Patient does not see a podiatrist. Eye exam is not current.  Lab Results  Component Value Date   HGBA1C 6.6 (A) 04/30/2018   Lab Results  Component Value Date   HGBA1C 6.8 (H) 02/12/2016    Hyperlipidemia Patient presents for follow up to hyperlipidemia.  She is not medication compliant. She is diet compliant and denies skin xanthelasma or statin intolerance including myalgias.  Lab Results  Component Value Date   CHOL 193 02/12/2016   Lab Results  Component Value Date   HDL 50 02/12/2016   Lab Results  Component Value Date   LDLCALC 132 (H) 02/12/2016   Lab Results  Component  Value Date   TRIG 56 02/12/2016   Lab Results  Component Value Date   CHOLHDL 3.9 02/12/2016   No results found for: LDLDIRECT   Vaginitis Patient complains of an abnormal vaginal discharge "that comes and goes".  Vaginal symptoms include local irritation, odor and lower abdominal pain. .Vulvar symptoms include none.STI Risk: Very low risk of STD exposure. Discharge described as: more than average amount.Other associated symptoms: none.Menstrual pattern: She had been bleeding postmenopausal. Contraception: abstinence  Back Pain: Patient  presents for presents evaluation of low back problems.  Symptoms have been present for a few years and include stiffness in lower back. Initial inciting event: none. Symptoms are worst: afternoon, evening, nighttime. Alleviating factors identifiable by patient are none. Exacerbating factors identifiable by patient are sitting, standing and for prolonged periods of time. Treatments so far initiated by patient: tylenol arthritis Previous lower back problems: same. Previous workup: none.    Review of Systems  Constitutional: Negative for fever, malaise/fatigue and weight loss.  HENT: Negative.  Negative for nosebleeds.   Eyes: Negative.  Negative for blurred vision, double vision and photophobia.  Respiratory: Negative.  Negative for cough and shortness of breath.   Cardiovascular: Negative.  Negative for chest pain, palpitations and leg swelling.  Gastrointestinal: Negative.  Negative for heartburn, nausea and vomiting.  Genitourinary:       SEE HPI  Musculoskeletal: Positive for back pain (chronic). Negative for myalgias.  Neurological: Negative.  Negative for dizziness, focal weakness, seizures and headaches.  Endo/Heme/Allergies: Positive for environmental allergies.  Psychiatric/Behavioral: Negative.  Negative for suicidal ideas.    Past Medical History:  Diagnosis Date  . Allergy   . Back pain   . Diabetes mellitus without complication ([place])   . Gallstones   . Headache(784.0)   . Hyperlipidemia   . Vaginal Pap smear, abnormal     Past Surgical History:  Procedure Laterality Date  . CHOLECYSTECTOMY N/A 01/13/2018   Procedure: LAPAROSCOPIC CHOLECYSTECTOMY;  Surgeon: [name], MD;  Location: [facility];  Service: General;  Laterality: N/A;  . NO PAST SURGERIES      Family History  Problem Relation Age of Onset  . CAD Mother   . Hypertension Mother   . Diabetes type II Sister   . Lung disease Sister   . Colon cancer Neg Hx   . Esophageal cancer Neg Hx    . Pancreatic cancer Neg Hx   . Rectal cancer Neg Hx   . Stomach cancer Neg Hx     Social History Reviewed with no changes to be made today.   Outpatient Medications Prior to Visit  Medication Sig Dispense Refill  . acetaminophen (TYLENOL) 325 MG tablet Take 650 mg by mouth every 6 (six) hours as needed.    Marland Kitchen oxyCODONE (OXY IR/ROXICODONE) 5 MG immediate release tablet Take 1-2 tablets (5-10 mg total) by mouth every 6 (six) hours as needed for moderate pain, severe pain or breakthrough pain. (Patient not taking: Reported on 04/30/2018) 30 tablet 0   No facility-administered medications prior to visit.     No Known Allergies     Objective:    BP 111/81 (BP Location: Left Arm, Patient Position: Sitting, Cuff Size: Normal)   Pulse 89   Temp 99.1 F (37.3 C) (Oral)   Ht _0  (1.575 m)   Wt 156 lb 12.8 oz (71.1 kg)   SpO2 94%   BMI 28.68 kg/m  Wt Readings from Last 3 Encounters:  04/30/18 156 lb 12.8  oz (71.1 kg)  01/13/18 158 lb (71.7 kg)  12/10/17 155 lb (70.3 kg)    Physical Exam  Constitutional: She is oriented to person, place, and time. She appears well-developed and well-nourished. She is cooperative.  HENT:  Head: Normocephalic and atraumatic.  Eyes: EOM are normal.  Neck: Normal range of motion.  Cardiovascular: Normal rate, regular rhythm, normal heart sounds and intact distal pulses. Exam reveals no gallop and no friction rub.  No murmur heard. Pulmonary/Chest: Effort normal and breath sounds normal. No tachypnea. No respiratory distress. She has no decreased breath sounds. She has no wheezes. She has no rhonchi. She has no rales. She exhibits no tenderness.  Abdominal: Soft. Bowel sounds are normal.  Musculoskeletal: Normal range of motion. She exhibits no edema, tenderness (NO TTP) or deformity.  Neurological: She is alert and oriented to person, place, and time. Coordination normal.  Skin: Skin is warm and dry.  Psychiatric: She has a normal mood and affect.  Her behavior is normal. Judgment and thought content normal.  Nursing note and vitals reviewed.     Patient has been counseled extensively about nutrition and exercise as well as the importance of adherence with medications and regular follow-up. The patient was given clear instructions to go to ER or return to medical center if symptoms don't improve, worsen or new problems develop. The patient verbalized understanding.   Follow-up: Return for PAP SMEAR.   [name], FNP-BC [facility] [place], [place]   04/30/2018, 2:59 PM

## 2018-04-30 NOTE — Patient Instructions (Signed)
Varenicline oral tablets What is this medicine? VARENICLINE (var [place] i kleen) is used to help people quit smoking. It can reduce the symptoms caused by stopping smoking. It is used with a patient support program recommended by your physician. This medicine may be used for other purposes; ask your health care provider or pharmacist if you have questions. COMMON BRAND NAME(S): Chantix What should I tell my health care provider before I take this medicine? They need to know if you have any of these conditions: -bipolar disorder, depression, schizophrenia or other mental illness -heart disease -if you often drink alcohol -kidney disease -peripheral vascular disease -seizures -stroke -suicidal thoughts, plans, or attempt; a previous suicide attempt by you or a family member -an unusual or allergic reaction to varenicline, other medicines, foods, dyes, or preservatives -pregnant or trying to get pregnant -breast-feeding How should I use this medicine? Take this medicine by mouth after eating. Take with a full glass of water. Follow the directions on the prescription label. Take your doses at regular intervals. Do not take your medicine more often than directed. There are 3 ways you can use this medicine to help you quit smoking; talk to your health care professional to decide which plan is right for you: 1) you can choose a quit date and start this medicine 1 week before the quit date, or, 2) you can start taking this medicine before you choose a quit date, and then pick a quit date between day 8 and 35 days of treatment, or, 3) if you are not sure that you are able or willing to quit smoking right away, start taking this medicine and slowly decrease the amount you smoke as directed by your health care professional with the goal of being cigarette-free by week 12 of treatment. Stick to your plan; ask about support groups or other ways to help you remain cigarette-free. If you are motivated to quit  smoking and did not succeed during a previous attempt with this medicine for reasons other than side effects, or if you returned to smoking after this treatment, speak with your health care professional about whether another course of this medicine may be right for you. A special MedGuide will be given to you by the pharmacist with each prescription and refill. Be sure to read this information carefully each time. Talk to your pediatrician regarding the use of this medicine in children. This medicine is not approved for use in children. Overdosage: If you think you have taken too much of this medicine contact a poison control center or emergency room at once. NOTE: This medicine is only for you. Do not share this medicine with others. What if I miss a dose? If you miss a dose, take it as soon as you can. If it is almost time for your next dose, take only that dose. Do not take double or extra doses. What may interact with this medicine? -alcohol or any product that contains alcohol -insulin -other stop smoking aids -theophylline -warfarin This list may not describe all possible interactions. Give your health care provider a list of all the medicines, herbs, non-prescription drugs, or dietary supplements you use. Also tell them if you smoke, drink alcohol, or use illegal drugs. Some items may interact with your medicine. What should I watch for while using this medicine? Visit your doctor or health care professional for regular check ups. Ask for ongoing advice and encouragement from your doctor or healthcare professional, friends, and family to help you quit. If   you smoke while on this medication, quit again Your mouth may get dry. Chewing sugarless gum or sucking hard candy, and drinking plenty of water may help. Contact your doctor if the problem does not go away or is severe. You may get drowsy or dizzy. Do not drive, use machinery, or do anything that needs mental alertness until you know how  this medicine affects you. Do not stand or sit up quickly, especially if you are an older patient. This reduces the risk of dizzy or fainting spells. Sleepwalking can happen during treatment with this medicine, and can sometimes lead to behavior that is harmful to you, other people, or property. Stop taking this medicine and tell your doctor if you start sleepwalking or have other unusual sleep-related activity. Decrease the amount of alcoholic beverages that you drink during treatment with this medicine until you know if this medicine affects your ability to tolerate alcohol. Some people have experienced increased drunkenness (intoxication), unusual or sometimes aggressive behavior, or no memory of things that have happened (amnesia) during treatment with this medicine. The use of this medicine may increase the chance of suicidal thoughts or actions. Pay special attention to how you are responding while on this medicine. Any worsening of mood, or thoughts of suicide or dying should be reported to your health care professional right away. What side effects may I notice from receiving this medicine? Side effects that you should report to your doctor or health care professional as soon as possible: -allergic reactions like skin rash, itching or hives, swelling of the face, lips, tongue, or throat -acting aggressive, being angry or violent, or acting on dangerous impulses -breathing problems -changes in vision -chest pain or chest tightness -confusion, trouble speaking or understanding -new or worsening depression, anxiety, or panic attacks -extreme increase in activity and talking (mania) -fast, irregular heartbeat -feeling faint or lightheaded, falls -fever -pain in legs when walking -problems with balance, talking, walking -redness, blistering, peeling or loosening of the skin, including inside the mouth -ringing in ears -seeing or hearing things that aren't there  (hallucinations) -seizures -sleepwalking -sudden numbness or weakness of the face, arm or leg -thoughts about suicide or dying, or attempts to commit suicide -trouble passing urine or change in the amount of urine -unusual bleeding or bruising -unusually weak or tired Side effects that usually do not require medical attention (report to your doctor or health care professional if they continue or are bothersome): -constipation -headache -nausea, vomiting -strange dreams -stomach gas -trouble sleeping This list may not describe all possible side effects. Call your doctor for medical advice about side effects. You may report side effects to FDA at 1-800-FDA-1088. Where should I keep my medicine? Keep out of the reach of children. Store at room temperature between 15 and 30 degrees C (59 and 86 degrees F). Throw away any unused medicine after the expiration date. NOTE: This sheet is a summary. It may not cover all possible information. If you have questions about this medicine, talk to your doctor, pharmacist, or health care provider.  2018 Elsevier/Gold Standard (2015-06-28 16:14:23) Nicotine skin patches What is this medicine? NICOTINE ([place]) helps people stop smoking. The patches replace the nicotine found in cigarettes and help to decrease withdrawal effects. They are most effective when used in combination with a stop-smoking program. This medicine may be used for other purposes; ask your health care provider or pharmacist if you have questions. COMMON BRAND NAME(S): Habitrol, Nicoderm CQ, Nicotrol What should I tell my  health care provider before I take this medicine? They need to know if you have any of these conditions: -diabetes -heart disease, angina, irregular heartbeat or previous heart attack -high blood pressure -lung disease, including asthma -overactive thyroid -pheochromocytoma -seizures or a history of seizures -skin problems, like eczema -stomach problems or  ulcers -an unusual or allergic reaction to nicotine, adhesives, other medicines, foods, dyes, or preservatives -pregnant or trying to get pregnant -breast-feeding How should I use this medicine? This medicine is for use on the skin. Follow the directions that come with the patches. Find an area of skin on your upper arm, chest, or back that is clean, dry, greaseless, undamaged and hairless. Wash hands with plain soap and water. Do not use anything that contains aloe, lanolin or glycerin as these may prevent the patch from sticking. Dry thoroughly. Remove the patch from the sealed pouch. Do not try to cut or trim the patch. Using your palm, press the patch firmly in place for 10 seconds to make sure that there is good contact with your skin. After applying the patch, wash your hands. Change the patch every day, keeping to a regular schedule. When you apply a new patch, use a new area of skin. Wait at least 1 week before using the same area again. Talk to your pediatrician regarding the use of this medicine in children. Special care may be needed. Overdosage: If you think you have taken too much of this medicine contact a poison control center or emergency room at once. NOTE: This medicine is only for you. Do not share this medicine with others. What if I miss a dose? If you forget to replace a patch, use it as soon as you can. Only use one patch at a time and do not leave on the skin for longer than directed. If a patch falls off, you can replace it, but keep to your schedule and remove the patch at the right time. What may interact with this medicine? -medicines for asthma -medicines for blood pressure -medicines for mental depression This list may not describe all possible interactions. Give your health care provider a list of all the medicines, herbs, non-prescription drugs, or dietary supplements you use. Also tell them if you smoke, drink alcohol, or use illegal drugs. Some items may interact with  your medicine. What should I watch for while using this medicine? You should begin using the nicotine patch the day you stop smoking. It is okay if you do not succeed at your attempt to quit and have a cigarette. You can still continue your quit attempt and keep using the product as directed. Just throw away your cigarettes and get back to your quit plan. You can keep the patch in place during swimming, bathing, and showering. If your patch falls off during these activities, replace it. When you first apply the patch, your skin may itch or burn. This should go away soon. When you remove a patch, the skin may look red, but this should only last for a few days. Call your doctor or health care professional if skin redness does not go away after 4 days, if your skin swells, or if you get a rash. If you are a diabetic and you quit smoking, the effects of insulin may be increased and you may need to reduce your insulin dose. Check with your doctor or health care professional about how you should adjust your insulin dose. If you are going to have a magnetic resonance imaging (  MRI) procedure, tell your MRI technician if you have this patch on your body. It must be removed before a MRI. What side effects may I notice from receiving this medicine? Side effects that you should report to your doctor or health care professional as soon as possible: -allergic reactions like skin rash, itching or hives, swelling of the face, lips, or tongue -breathing problems -changes in hearing -changes in vision -chest pain -cold sweats -confusion -fast, irregular heartbeat -feeling faint or lightheaded, falls -headache -increased saliva -skin redness that lasts more than 4 days -stomach pain -signs and symptoms of nicotine overdose like nausea; vomiting; dizziness; weakness; and rapid heartbeat Side effects that usually do not require medical attention (report to your doctor or health care professional if they continue  or are bothersome): -diarrhea -dry mouth -hiccups -irritability -nervousness or restlessness -trouble sleeping or vivid dreams This list may not describe all possible side effects. Call your doctor for medical advice about side effects. You may report side effects to FDA at 1-800-FDA-1088. Where should I keep my medicine? Keep out of the reach of children. Store at room temperature between 20 and 25 degrees C (68 and 77 degrees F). Protect from heat and light. Store in International aid/development worker until ready to use. Throw away unused medicine after the expiration date. When you remove a patch, fold with sticky sides together; put in an empty opened pouch and throw away. NOTE: This sheet is a summary. It may not cover all possible information. If you have questions about this medicine, talk to your doctor, pharmacist, or health care provider.  2018 Elsevier/Gold Standard (2014-09-11 15:46:21)  Coping with Quitting Smoking Quitting smoking is a physical and mental challenge. You will face cravings, withdrawal symptoms, and temptation. Before quitting, work with your health care provider to make a plan that can help you cope. Preparation can help you quit and keep you from giving in. How can I cope with cravings? Cravings usually last for 5-10 minutes. If you get through it, the craving will pass. Consider taking the following actions to help you cope with cravings:  Keep your mouth busy: ? Chew sugar-free gum. ? Suck on hard candies or a straw. ? Brush your teeth.  Keep your hands and body busy: ? Immediately change to a different activity when you feel a craving. ? Squeeze or play with a ball. ? Do an activity or a hobby, like making bead jewelry, practicing needlepoint, or working with wood. ? Mix up your normal routine. ? Take a short exercise break. Go for a quick walk or run up and down stairs. ? Spend time in public places where smoking is not allowed.  Focus on doing something kind  or helpful for someone else.  Call a friend or family member to talk during a craving.  Join a support group.  Call a quit line, such as 1-800-QUIT-NOW.  Talk with your health care provider about medicines that might help you cope with cravings and make quitting easier for you.  How can I deal with withdrawal symptoms? Your body may experience negative effects as it tries to get used to not having nicotine in the system. These effects are called withdrawal symptoms. They may include:  Feeling hungrier than normal.  Trouble concentrating.  Irritability.  Trouble sleeping.  Feeling depressed.  Restlessness and agitation.  Craving a cigarette.  To manage withdrawal symptoms:  Avoid places, people, and activities that trigger your cravings.  Remember why you want to quit.  Get  plenty of sleep.  Avoid coffee and other caffeinated drinks. These may worsen some of your symptoms.  How can I handle social situations? Social situations can be difficult when you are quitting smoking, especially in the first few weeks. To manage this, you can:  Avoid parties, bars, and other social situations where people might be smoking.  Avoid alcohol.  Leave right away if you have the urge to smoke.  Explain to your family and friends that you are quitting smoking. Ask for understanding and support.  Plan activities with friends or family where smoking is not an option.  What are some ways I can cope with stress? Wanting to smoke may cause stress, and stress can make you want to smoke. Find ways to manage your stress. Relaxation techniques can help. For example:  Breathe slowly and deeply, in through your nose and out through your mouth.  Listen to soothing, relaxing music.  Talk with a family member or friend about your stress.  Light a candle.  Soak in a bath or take a shower.  Think about a peaceful place.  What are some ways I can prevent weight gain? Be aware that many  people gain weight after they quit smoking. However, not everyone does. To keep from gaining weight, have a plan in place before you quit and stick to the plan after you quit. Your plan should include:  Having healthy snacks. When you have a craving, it may help to: ? Eat plain popcorn, crunchy carrots, celery, or other cut vegetables. ? Chew sugar-free gum.  Changing how you eat: ? Eat small portion sizes at meals. ? Eat 4-6 small meals throughout the day instead of 1-2 large meals a day. ? Be mindful when you eat. Do not watch television or do other things that might distract you as you eat.  Exercising regularly: ? Make time to exercise each day. If you do not have time for a long workout, do short bouts of exercise for 5-10 minutes several times a day. ? Do some form of strengthening exercise, like weight lifting, and some form of aerobic exercise, like running or swimming.  Drinking plenty of water or other low-calorie or no-calorie drinks. Drink 6-8 glasses of water daily, or as much as instructed by your health care provider.  Summary  Quitting smoking is a physical and mental challenge. You will face cravings, withdrawal symptoms, and temptation to smoke again. Preparation can help you as you go through these challenges.  You can cope with cravings by keeping your mouth busy (such as by chewing gum), keeping your body and hands busy, and making calls to family, friends, or a helpline for people who want to quit smoking.  You can cope with withdrawal symptoms by avoiding places where people smoke, avoiding drinks with caffeine, and getting plenty of rest.  Ask your health care provider about the different ways to prevent weight gain, avoid stress, and handle social situations. This information is not intended to replace advice given to you by your health care provider. Make sure you discuss any questions you have with your health care provider. Document Released: 10/10/2016 Document  Revised: 10/10/2016 Document Reviewed: 10/10/2016 Elsevier Interactive Patient Education  2018 [place] Risks of Smoking Smoking cigarettes is very bad for your health. Tobacco smoke has over 200 known poisons in it. It contains the poisonous gases nitrogen oxide and carbon monoxide. There are over 60 chemicals in tobacco smoke that cause cancer. Smoking is difficult to  quit because a chemical in tobacco, called nicotine, causes addiction or dependence. When you smoke and inhale, nicotine is absorbed rapidly into the bloodstream through your lungs. Both inhaled and non-inhaled nicotine may be addictive. What are the risks of cigarette smoke? Cigarette smokers have an increased risk of many serious medical problems, including:  Lung cancer.  Lung disease, such as pneumonia, bronchitis, and emphysema.  Chest pain (angina) and heart attack because the heart is not getting enough oxygen.  Heart disease and peripheral blood vessel disease.  High blood pressure (hypertension).  Stroke.  Oral cancer, including cancer of the lip, mouth, or voice box.  Bladder cancer.  Pancreatic cancer.  Cervical cancer.  Pregnancy complications, including premature birth.  Stillbirths and smaller newborn babies, birth defects, and genetic damage to sperm.  Early menopause.  Lower estrogen level for women.  Infertility.  Facial wrinkles.  Blindness.  Increased risk of broken bones (fractures).  Senile dementia.  Stomach ulcers and internal bleeding.  Delayed wound healing and increased risk of complications during surgery.  Even smoking lightly shortens your life expectancy by several years.  Because of secondhand smoke exposure, children of smokers have an increased risk of the following:  Sudden infant death syndrome (SIDS).  Respiratory infections.  Lung cancer.  Heart disease.  Ear infections.  What are the benefits of quitting? There are many health  benefits of quitting smoking. Here are some of them:  Within days of quitting smoking, your risk of having a heart attack decreases, your blood flow improves, and your lung capacity improves. Blood pressure, pulse rate, and breathing patterns start returning to normal soon after quitting.  Within months, your lungs may clear up completely.  Quitting for 10 years reduces your risk of developing lung cancer and heart disease to almost that of a nonsmoker.  People who quit may see an improvement in their overall quality of life.  How do I quit smoking? Smoking is an addiction with both physical and psychological effects, and longtime habits can be hard to change. Your health care provider can recommend:  Programs and community resources, which may include group support, education, or talk therapy.  Prescription medicines to help reduce cravings.  Nicotine replacement products, such as patches, gum, and nasal sprays. Use these products only as directed. Do not replace cigarette smoking with electronic cigarettes, which are commonly called e-cigarettes. The safety of e-cigarettes is not known, and some may contain harmful chemicals.  A combination of two or more of these methods.  Where to find more information:  American Lung Association: www.lung.org  American Cancer Society: www.cancer.org Summary  Smoking cigarettes is very bad for your health. Cigarette smokers have an increased risk of many serious medical problems, including several cancers, heart disease, and stroke.  Smoking is an addiction with both physical and psychological effects, and longtime habits can be hard to change.  By stopping right away, you can greatly reduce the risk of medical problems for you and your family.  To help you quit smoking, your health care provider can recommend programs, community resources, prescription medicines, and nicotine replacement products such as patches, gum, and nasal sprays. This  information is not intended to replace advice given to you by your health care provider. Make sure you discuss any questions you have with your health care provider. Document Released: 11/20/2004 Document Revised: 10/17/2016 Document Reviewed: 10/17/2016 Elsevier Interactive Patient Education  2017 [employer].  Steps to Quit Smoking Smoking tobacco can be bad for your health.  It can also affect almost every organ in your body. Smoking puts you and people around you at risk for many serious long-lasting (chronic) diseases. Quitting smoking is hard, but it is one of the best things that you can do for your health. It is never too late to quit. What are the benefits of quitting smoking? When you quit smoking, you lower your risk for getting serious diseases and conditions. They can include:  Lung cancer or lung disease.  Heart disease.  Stroke.  Heart attack.  Not being able to have children (infertility).  Weak bones (osteoporosis) and broken bones (fractures).  If you have coughing, wheezing, and shortness of breath, those symptoms may get better when you quit. You may also get sick less often. If you are pregnant, quitting smoking can help to lower your chances of having a baby of low birth weight. What can I do to help me quit smoking? Talk with your doctor about what can help you quit smoking. Some things you can do (strategies) include:  Quitting smoking totally, instead of slowly cutting back how much you smoke over a period of time.  Going to in-person counseling. You are more likely to quit if you go to many counseling sessions.  Using resources and support systems, such as: ? Database administrator with a Social worker. ? Phone quitlines. ? Careers information officer. ? Support groups or group counseling. ? Text messaging programs. ? Mobile phone apps or applications.  Taking medicines. Some of these medicines may have nicotine in them. If you are pregnant or breastfeeding, do not  take any medicines to quit smoking unless your doctor says it is okay. Talk with your doctor about counseling or other things that can help you.  Talk with your doctor about using more than one strategy at the same time, such as taking medicines while you are also going to in-person counseling. This can help make quitting easier. What things can I do to make it easier to quit? Quitting smoking might feel very hard at first, but there is a lot that you can do to make it easier. Take these steps:  Talk to your family and friends. Ask them to support and encourage you.  Call phone quitlines, reach out to support groups, or work with a Social worker.  Ask people who smoke to not smoke around you.  Avoid places that make you want (trigger) to smoke, such as: ? Bars. ? Parties. ? Smoke-break areas at work.  Spend time with people who do not smoke.  Lower the stress in your life. Stress can make you want to smoke. Try these things to help your stress: ? Getting regular exercise. ? Deep-breathing exercises. ? Yoga. ? Meditating. ? Doing a body scan. To do this, close your eyes, focus on one area of your body at a time from head to toe, and notice which parts of your body are tense. Try to relax the muscles in those areas.  Download or buy apps on your mobile phone or tablet that can help you stick to your quit plan. There are many free apps, such as QuitGuide from the [employer] Office manager for Disease Control and Prevention). You can find more support from smokefree.gov and other websites.  This information is not intended to replace advice given to you by your health care provider. Make sure you discuss any questions you have with your health care provider. Document Released: 08/09/2009 Document Revised: 06/10/2016 Document Reviewed: 02/27/2015 Elsevier Interactive Patient Education  2018 Elsevier  Inc.  

## 2018-05-01 LAB — LIPID PANEL
CHOLESTEROL TOTAL: 228 mg/dL — AB (ref 100–199)
Chol/HDL Ratio: 4.3 ratio (ref 0.0–4.4)
HDL: 53 mg/dL (ref >39)
LDL CALC: 159 mg/dL — AB (ref 0–99)
Triglycerides: 78 mg/dL (ref 0–149)
VLDL Cholesterol Cal: 16 mg/dL (ref 5–40)

## 2018-05-01 LAB — CMP14+EGFR
A/G RATIO: 1.4 (ref 1.2–2.2)
ALT: 15 IU/L (ref 0–32)
AST: 18 IU/L (ref 0–40)
Albumin: 4.7 g/dL (ref 3.5–5.5)
Alkaline Phosphatase: 94 IU/L (ref 39–117)
BILIRUBIN TOTAL: 0.4 mg/dL (ref 0.0–1.2)
BUN / CREAT RATIO: 13 (ref 9–23)
BUN: 9 mg/dL (ref 6–24)
CHLORIDE: 103 mmol/L (ref 96–106)
CO2: 26 mmol/L (ref 20–29)
Calcium: 9.9 mg/dL (ref 8.7–10.2)
Creatinine, Ser: 0.72 mg/dL (ref 0.57–1.00)
GFR, EST AFRICAN AMERICAN: 108 mL/min/{1.73_m2} (ref >59)
GFR, EST NON AFRICAN AMERICAN: 94 mL/min/{1.73_m2} (ref >59)
GLUCOSE: 104 mg/dL — AB (ref 65–99)
Globulin, Total: 3.3 g/dL (ref 1.5–4.5)
POTASSIUM: 4.4 mmol/L (ref 3.5–5.2)
Sodium: 143 mmol/L (ref 134–144)
Total Protein: 8 g/dL (ref 6.0–8.5)

## 2018-05-01 LAB — MICROALBUMIN / CREATININE URINE RATIO
CREATININE, UR: 447.3 mg/dL
MICROALB/CREAT RATIO: 17.7 mg/g{creat} (ref 0.0–30.0)
MICROALBUM., U, RANDOM: 79.3 ug/mL

## 2018-05-01 LAB — VITAMIN D 25 HYDROXY (VIT D DEFICIENCY, FRACTURES): VIT D 25 HYDROXY: 27.2 ng/mL — AB (ref 30.0–100.0)

## 2018-05-01 LAB — HEPATITIS C ANTIBODY

## 2018-05-01 LAB — HIV ANTIBODY (ROUTINE TESTING W REFLEX): HIV SCREEN 4TH GENERATION: NONREACTIVE

## 2018-05-31 ENCOUNTER — Other Ambulatory Visit (HOSPITAL_COMMUNITY)
Admission: RE | Admit: 2018-05-31 | Discharge: 2018-05-31 | Disposition: A | Discharge status: Home / Self Care | Payer: BLUE CROSS/BLUE SHIELD | Source: Ambulatory Visit | Attending: Nurse Practitioner | Admitting: Nurse Practitioner

## 2018-05-31 ENCOUNTER — Ambulatory Visit: Payer: BLUE CROSS/BLUE SHIELD | Attending: Nurse Practitioner | Admitting: Nurse Practitioner

## 2018-05-31 ENCOUNTER — Encounter: Payer: Self-pay | Admitting: Nurse Practitioner

## 2018-05-31 VITALS — BP 122/87 | HR 76 | Temp 98.5°F | Ht 62.0 in | Wt 156.2 lb

## 2018-05-31 DIAGNOSIS — Z801 Family history of malignant neoplasm of trachea, bronchus and lung: Secondary | ICD-10-CM | POA: Diagnosis not present

## 2018-05-31 DIAGNOSIS — Z8249 Family history of ischemic heart disease and other diseases of the circulatory system: Secondary | ICD-10-CM | POA: Insufficient documentation

## 2018-05-31 DIAGNOSIS — E119 Type 2 diabetes mellitus without complications: Secondary | ICD-10-CM | POA: Insufficient documentation

## 2018-05-31 DIAGNOSIS — R42 Dizziness and giddiness: Secondary | ICD-10-CM | POA: Diagnosis present

## 2018-05-31 DIAGNOSIS — Z124 Encounter for screening for malignant neoplasm of cervix: Secondary | ICD-10-CM | POA: Insufficient documentation

## 2018-05-31 DIAGNOSIS — Z79899 Other long term (current) drug therapy: Secondary | ICD-10-CM | POA: Insufficient documentation

## 2018-05-31 DIAGNOSIS — Z9049 Acquired absence of other specified parts of digestive tract: Secondary | ICD-10-CM | POA: Diagnosis not present

## 2018-05-31 DIAGNOSIS — E785 Hyperlipidemia, unspecified: Secondary | ICD-10-CM | POA: Insufficient documentation

## 2018-05-31 DIAGNOSIS — Z833 Family history of diabetes mellitus: Secondary | ICD-10-CM | POA: Diagnosis not present

## 2018-05-31 LAB — GLUCOSE, POCT (MANUAL RESULT ENTRY): POC Glucose: 128 mg/dl — AB (ref 70–99)

## 2018-05-31 MED ORDER — GLUCOSE BLOOD VI STRP: ORAL_STRIP | 12 refills | Status: DC

## 2018-05-31 MED ORDER — CONTOUR NEXT MONITOR W/DEVICE KIT: 1.0000 | PACK | Freq: Every day | 0 refills | Status: DC

## 2018-05-31 MED ORDER — MICROLET NEXT LANCING DEVICE MISC: 1.0000 | Freq: Every day | 0 refills | Status: DC

## 2018-05-31 MED ORDER — MICROLET LANCETS MISC: 1.0000 | Freq: Every day | 1 refills | Status: DC

## 2018-05-31 NOTE — Progress Notes (Signed)
Assessment & Plan:  Mallory was seen today for gynecologic exam.  Diagnoses and all orders for this visit:  Encounter for Papanicolaou smear for cervical cancer screening -     Cytology - PAP  Controlled type 2 diabetes mellitus without complication, without long-term current use of insulin (HCC) -     Glucose (CBG) -     glucose blood test strip; Use as instructed -     Lancet Devices (MICROLET NEXT LANCING DEVICE) MISC; 1 each by Does not apply route daily before breakfast. -     MICROLET LANCETS MISC; 1 each by Does not apply route daily before breakfast. -     Blood Glucose Monitoring Suppl (CONTOUR NEXT MONITOR) w/Device KIT; 1 each by Does not apply route daily before breakfast. Controlled Continue medications as prescribed.  Continue blood sugar control as discussed in office today, low carbohydrate diet, and regular physical exercise as tolerated, 150 minutes per week (30 min each day, 5 days per week, or 50 min 3 days per week). Keep blood sugar logs with fasting goal of 90-130 mg/dl, post prandial (after you eat) less than 180.  For Hypoglycemia: BS <60 and Hyperglycemia BS >400; contact the clinic ASAP. Annual eye exams and foot exams are recommended.   Patient has been counseled on age-appropriate routine health concerns for screening and prevention. These are reviewed and up-to-date. Referrals have been placed accordingly. Immunizations are up-to-date or declined.    Subjective:   Chief Complaint  Patient presents with  . Gynecologic Exam    Pt. is here for pap smear.    HPI Mallory Wall 57 y.o. female presents to office today for pap smear. She also endorses intermittent dizziness with onset over a month ago. Dizziness last from minutes to hours and is occurring intermittently. Aggravating factors: rapid head movements. Relieving factors: goes away on its own. Her physical exam was benign today. I have instructed her to check her blood sugars when she is experiencing  the dizziness. She notes not eating for hours sometimes prior to the episodes. She denies any headaches, nausea, vomiting or visual disturbances.     Review of Systems  Constitutional: Negative.  Negative for chills, fever, malaise/fatigue and weight loss.  Respiratory: Negative.  Negative for cough, shortness of breath and wheezing.   Cardiovascular: Negative.  Negative for chest pain, orthopnea and leg swelling.  Gastrointestinal: Negative for abdominal pain.  Genitourinary: Negative.  Negative for flank pain.  Skin: Negative.  Negative for rash.  Neurological: Positive for dizziness.  Psychiatric/Behavioral: Negative for suicidal ideas.    Past Medical History:  Diagnosis Date  . Allergy   . Back pain   . Diabetes mellitus without complication ([place])   . Gallstones   . Headache(784.0)   . Hyperlipidemia   . Vaginal Pap smear, abnormal     Past Surgical History:  Procedure Laterality Date  . CHOLECYSTECTOMY N/A 01/13/2018   Procedure: LAPAROSCOPIC CHOLECYSTECTOMY;  Surgeon: [name], MD;  Location: [facility];  Service: General;  Laterality: N/A;  . NO PAST SURGERIES      Family History  Problem Relation Age of Onset  . CAD Mother   . Hypertension Mother   . Diabetes type II Sister   . Lung disease Sister   . Colon cancer Neg Hx   . Esophageal cancer Neg Hx   . Pancreatic cancer Neg Hx   . Rectal cancer Neg Hx   . Stomach cancer Neg Hx  Social History Reviewed with no changes to be made today.   Outpatient Medications Prior to Visit  Medication Sig Dispense Refill  . acetaminophen (TYLENOL) 325 MG tablet Take 650 mg by mouth every 6 (six) hours as needed.    . simvastatin (ZOCOR) 20 MG tablet Take 1 tablet (20 mg total) by mouth at bedtime. 90 tablet 3   No facility-administered medications prior to visit.     No Known Allergies     Objective:    BP 122/87 (BP Location: Left Arm, Patient Position: Sitting, Cuff Size: Normal)    Pulse 76   Temp 98.5 F (36.9 C) (Oral)   Ht '5\' 2"'$  (1.575 m)   Wt 156 lb 3.2 oz (70.9 kg)   SpO2 96%   BMI 28.57 kg/m  Wt Readings from Last 3 Encounters:  05/31/18 156 lb 3.2 oz (70.9 kg)  04/30/18 156 lb 12.8 oz (71.1 kg)  01/13/18 158 lb (71.7 kg)    Physical Exam  Constitutional: She is oriented to person, place, and time. She appears well-developed and well-nourished.  HENT:  Head: Normocephalic.  Right Ear: Hearing, tympanic membrane, external ear and ear canal normal.  Left Ear: Hearing, tympanic membrane, external ear and ear canal normal.  Nose: No mucosal edema or rhinorrhea. Right sinus exhibits no maxillary sinus tenderness and no frontal sinus tenderness. Left sinus exhibits no maxillary sinus tenderness and no frontal sinus tenderness.  Cardiovascular: Normal rate, regular rhythm and normal heart sounds.  Pulses:      Dorsalis pedis pulses are 1+ on the right side, and 2+ on the left side.       Posterior tibial pulses are 1+ on the right side, and 2+ on the left side.  Pulmonary/Chest: Effort normal and breath sounds normal.  Abdominal: Soft. Bowel sounds are normal. Hernia confirmed negative in the right inguinal area and confirmed negative in the left inguinal area.  Genitourinary: Rectum normal and uterus normal. Rectal exam shows no external hemorrhoid. No labial fusion. There is no rash, tenderness, lesion or injury on the right labia. There is no rash, tenderness, lesion or injury on the left labia. Uterus is not deviated and not enlarged. Cervix exhibits no motion tenderness and no friability. Right adnexum displays no mass, no tenderness and no fullness. Left adnexum displays no mass, no tenderness and no fullness. No erythema, tenderness or bleeding in the vagina. No foreign body in the vagina. No signs of injury around the vagina. Vaginal discharge found.  Musculoskeletal:       Right foot: There is normal range of motion and no deformity.       Left foot: There  is normal range of motion and no deformity.  Feet:  Right Foot:  Protective Sensation: 10 sites tested. 10 sites sensed.  Skin Integrity: Negative for skin breakdown.  Left Foot:  Protective Sensation: 10 sites tested. 10 sites sensed.  Skin Integrity: Negative for skin breakdown.  Lymphadenopathy: No inguinal adenopathy noted on the right or left side.       Right: No inguinal adenopathy present.       Left: No inguinal adenopathy present.  Neurological: She is alert and oriented to person, place, and time. She displays a negative Romberg sign. Coordination and gait normal.  Skin: Skin is warm and dry.  Psychiatric: She has a normal mood and affect. Her behavior is normal. Judgment and thought content normal.         Patient has been counseled extensively about nutrition  and exercise as well as the importance of adherence with medications and regular follow-up. The patient was given clear instructions to go to ER or return to medical center if symptoms don't improve, worsen or new problems develop. The patient verbalized understanding.   Follow-up: Return if symptoms worsen or fail to improve.   [name], FNP-BC [facility] and [place], [place]   05/31/2018, 10:45 AM

## 2018-05-31 NOTE — Patient Instructions (Signed)

## 2018-06-01 LAB — CYTOLOGY - PAP
Adequacy: ABSENT
BACTERIAL VAGINITIS: POSITIVE — AB
CHLAMYDIA, DNA PROBE: NEGATIVE
Candida vaginitis: NEGATIVE
DIAGNOSIS: NEGATIVE
NEISSERIA GONORRHEA: NEGATIVE
Trichomonas: NEGATIVE

## 2018-06-02 ENCOUNTER — Other Ambulatory Visit: Payer: Self-pay | Admitting: Nurse Practitioner

## 2018-06-02 MED ORDER — METRONIDAZOLE 500 MG PO TABS: 500.0000 mg | ORAL_TABLET | Freq: Two times a day (BID) | ORAL | 0 refills | Status: AC

## 2018-06-04 ENCOUNTER — Telehealth: Payer: Self-pay

## 2018-06-04 ENCOUNTER — Telehealth: Payer: Self-pay | Admitting: Nurse Practitioner

## 2018-06-04 NOTE — Telephone Encounter (Signed)
Patient called, verified DOB, received results, was made aware of prescription and scheduled her repeat pap smear, no further questions.

## 2018-06-04 NOTE — Telephone Encounter (Signed)
-----   Message from [name], NP sent at 06/02/2018 12:07 PM EDT ----- PAP smear results positive for bacterial vaginosis. Will send prescription to the pharmacy.

## 2018-06-04 NOTE — Telephone Encounter (Signed)
-----   Message from [name], NP sent at 06/02/2018 12:13 PM EDT ----- Unfortunately your pap smear did not render enough cervical cells. Will need to repeat at your earliest convenience.

## 2018-06-04 NOTE — Telephone Encounter (Signed)
Noted  

## 2018-06-04 NOTE — Telephone Encounter (Signed)
CMA attempt to call patient to inform on results. No answer and left a VM for patient to call back.  If patient call back, please inform:  Unfortunately your pap smear did not render enough cervical cells. Will need to repeat at your earliest convenience.  Please make an appt. For another pap smear.          PAP smear results positive for bacterial vaginosis. Will send prescription to the pharmacy.

## 2018-06-15 ENCOUNTER — Ambulatory Visit
Admission: RE | Admit: 2018-06-15 | Discharge: 2018-06-15 | Disposition: A | Discharge status: Home / Self Care | Payer: BLUE CROSS/BLUE SHIELD | Source: Ambulatory Visit | Attending: Nurse Practitioner | Admitting: Nurse Practitioner

## 2018-06-15 DIAGNOSIS — Z1231 Encounter for screening mammogram for malignant neoplasm of breast: Secondary | ICD-10-CM

## 2018-07-06 ENCOUNTER — Encounter: Payer: Self-pay | Admitting: Nurse Practitioner

## 2018-07-06 ENCOUNTER — Other Ambulatory Visit (HOSPITAL_COMMUNITY)
Admission: RE | Admit: 2018-07-06 | Discharge: 2018-07-06 | Disposition: A | Discharge status: Home / Self Care | Payer: BLUE CROSS/BLUE SHIELD | Source: Ambulatory Visit | Attending: Nurse Practitioner | Admitting: Nurse Practitioner

## 2018-07-06 ENCOUNTER — Ambulatory Visit: Payer: BLUE CROSS/BLUE SHIELD | Admitting: Nurse Practitioner

## 2018-07-06 VITALS — BP 139/86 | HR 70 | Temp 98.6°F | Ht 66.0 in | Wt 155.6 lb

## 2018-07-06 DIAGNOSIS — R87615 Unsatisfactory cytologic smear of cervix: Secondary | ICD-10-CM

## 2018-07-06 DIAGNOSIS — Z789 Other specified health status: Secondary | ICD-10-CM

## 2018-07-06 DIAGNOSIS — R040 Epistaxis: Secondary | ICD-10-CM

## 2018-07-06 DIAGNOSIS — E119 Type 2 diabetes mellitus without complications: Secondary | ICD-10-CM | POA: Diagnosis not present

## 2018-07-06 LAB — GLUCOSE, POCT (MANUAL RESULT ENTRY): POC Glucose: 116 mg/dl — AB (ref 70–99)

## 2018-07-06 MED ORDER — OMEGA-3-ACID ETHYL ESTERS 1 G PO CAPS: 1.0000 g | ORAL_CAPSULE | Freq: Two times a day (BID) | ORAL | 2 refills | Status: DC

## 2018-07-06 NOTE — Progress Notes (Signed)
Assessment & Plan:  Mallory was seen today for gynecologic exam.  Diagnoses and all orders for this visit:  Encounter for repeat Pap smear due to previous insufficient cervical cells -     Cytology - PAP  Diabetes mellitus without complication (HCC) -     Glucose (CBG)  Epistaxis -     Ambulatory referral to ENT -     CBC  Statin intolerance -     omega-3 acid ethyl esters (LOVAZA) 1 g capsule; Take 1 capsule (1 g total) by mouth 2 (two) times daily.    Patient has been counseled on age-appropriate routine health concerns for screening and prevention. These are reviewed and up-to-date. Referrals have been placed accordingly. Immunizations are up-to-date or declined.    Subjective:   Chief Complaint  Patient presents with  . Gynecologic Exam    Pt. is here to repeat her pap smear.    HPI Mallory Wall 57 y.o. female presents to office today for repeat PAP.  Her last pap smear did not render enough cervical cells for review. She has a history of abnormal PAP smear with no GYN follow up. She endorses increased dizziness since taking simvastatin. Will dc at this time and start her on omega 3.    Chronic intermittent Epistaxis Patient presents with complaints of a recent nosebleed  (this morning and yesterday) from bilateral nostrils.  It is associated with no specific cause. She states this is not a new occurrence.   Review of notes by her PCP dated 08-29-2015 The bleeding has been from the right nare. This is a recurrent problem. The current episode started more than 1 month ago. Episode frequency: every other day. The problem has been unchanged. The bleeding is associated with nothing. Treatments tried: nasal saline. The treatment provided no relief. There is no history of a bleeding disorder or colds.  Patient will continue to use nasal saline twice daily. She may also try Afrin spray but use no longer than 3 consecutive days to prevent rebound swelling. If problem persist  past that then she may require ENT referral. She reports that BP's have been normal even with the nose bleeds.  Review of Systems  Constitutional: Positive for malaise/fatigue. Negative for chills, fever and weight loss.  HENT: Positive for nosebleeds.   Respiratory: Negative.  Negative for cough, shortness of breath and wheezing.   Cardiovascular: Negative.  Negative for chest pain, orthopnea and leg swelling.  Gastrointestinal: Negative for abdominal pain.  Genitourinary: Negative.  Negative for flank pain.  Skin: Negative.  Negative for rash.  Neurological: Positive for dizziness and headaches.  Endo/Heme/Allergies: Positive for environmental allergies.  Psychiatric/Behavioral: Negative for suicidal ideas.    Past Medical History:  Diagnosis Date  . Allergy   . Back pain   . Diabetes mellitus without complication ([place])   . Gallstones   . Headache(784.0)   . Hyperlipidemia   . Vaginal Pap smear, abnormal     Past Surgical History:  Procedure Laterality Date  . CHOLECYSTECTOMY N/A 01/13/2018   Procedure: LAPAROSCOPIC CHOLECYSTECTOMY;  Surgeon: [name], MD;  Location: [facility];  Service: General;  Laterality: N/A;  . NO PAST SURGERIES      Family History  Problem Relation Age of Onset  . CAD Mother   . Hypertension Mother   . Diabetes type II Sister   . Lung disease Sister   . Colon cancer Neg Hx   . Esophageal cancer Neg Hx   . Pancreatic  cancer Neg Hx   . Rectal cancer Neg Hx   . Stomach cancer Neg Hx     Social History Reviewed with no changes to be made today.   Outpatient Medications Prior to Visit  Medication Sig Dispense Refill  . Blood Glucose Monitoring Suppl (CONTOUR NEXT MONITOR) w/Device KIT 1 each by Does not apply route daily before breakfast. 1 kit 0  . glucose blood test strip Use as instructed 100 each 12  . MICROLET LANCETS MISC 1 each by Does not apply route daily before breakfast. 100 each 1  . simvastatin (ZOCOR) 20  MG tablet Take 1 tablet (20 mg total) by mouth at bedtime. 90 tablet 3  . acetaminophen (TYLENOL) 325 MG tablet Take 650 mg by mouth every 6 (six) hours as needed.    Elmore Guise Devices ([place] NEXT LANCING DEVICE) MISC 1 each by Does not apply route daily before breakfast. (Patient not taking: Reported on 07/06/2018) 1 each 0   No facility-administered medications prior to visit.     No Known Allergies     Objective:    BP 139/86 (BP Location: Left Arm, Patient Position: Sitting, Cuff Size: Large)   Pulse 70   Temp 98.6 F (37 C) (Oral)   Ht _0  (1.676 m)   Wt 155 lb 9.6 oz (70.6 kg)   SpO2 100%   BMI 25.11 kg/m  Wt Readings from Last 3 Encounters:  07/06/18 155 lb 9.6 oz (70.6 kg)  05/31/18 156 lb 3.2 oz (70.9 kg)  04/30/18 156 lb 12.8 oz (71.1 kg)    Physical Exam  Constitutional: She is oriented to person, place, and time. She appears well-developed and well-nourished.  HENT:  Head: Normocephalic.  Nose: No mucosal edema, rhinorrhea, nasal deformity, septal deviation or nasal septal hematoma. No epistaxis.  No foreign bodies.  Cardiovascular: Normal rate, regular rhythm and normal heart sounds.  Pulmonary/Chest: Effort normal and breath sounds normal.  Abdominal: Soft. Bowel sounds are normal. Hernia confirmed negative in the right inguinal area and confirmed negative in the left inguinal area.  Genitourinary: Rectum normal, vagina normal and uterus normal. Rectal exam shows no external hemorrhoid. No labial fusion. There is no rash, tenderness, lesion or injury on the right labia. There is no rash, tenderness, lesion or injury on the left labia. Uterus is not deviated and not enlarged. Cervix exhibits no motion tenderness, no discharge and no friability. Right adnexum displays no mass, no tenderness and no fullness. Left adnexum displays no mass, no tenderness and no fullness. No erythema, tenderness or bleeding in the vagina. No foreign body in the vagina. No signs of injury  around the vagina. No vaginal discharge found.  Lymphadenopathy: No inguinal adenopathy noted on the right or left side.       Right: No inguinal adenopathy present.       Left: No inguinal adenopathy present.  Neurological: She is alert and oriented to person, place, and time. She displays a negative Romberg sign. Coordination and gait normal.  Skin: Skin is warm and dry.  Psychiatric: She has a normal mood and affect. Her behavior is normal. Judgment and thought content normal.         Patient has been counseled extensively about nutrition and exercise as well as the importance of adherence with medications and regular follow-up. The patient was given clear instructions to go to ER or return to medical center if symptoms don't improve, worsen or new problems develop. The patient verbalized understanding.  Follow-up: Return in 2 months (on 09/06/2018) for DM/HPL.   [name], FNP-BC [facility] and [place], [place]   07/06/2018, 12:47 PM

## 2018-07-07 LAB — CBC
HEMATOCRIT: 38.1 % (ref 34.0–46.6)
HEMOGLOBIN: 12.4 g/dL (ref 11.1–15.9)
MCH: 30 pg (ref 26.6–33.0)
MCHC: 32.5 g/dL (ref 31.5–35.7)
MCV: 92 fL (ref 79–97)
Platelets: 333 10*3/uL (ref 150–450)
RBC: 4.14 x10E6/uL (ref 3.77–5.28)
RDW: 13.4 % (ref 12.3–15.4)
WBC: 6.1 10*3/uL (ref 3.4–10.8)

## 2018-07-12 ENCOUNTER — Telehealth: Payer: Self-pay

## 2018-07-12 LAB — CYTOLOGY - PAP
BACTERIAL VAGINITIS: NEGATIVE
Candida vaginitis: NEGATIVE
Chlamydia: NEGATIVE
DIAGNOSIS: NEGATIVE
HPV: DETECTED — AB
Neisseria Gonorrhea: NEGATIVE
Trichomonas: NEGATIVE

## 2018-07-12 NOTE — Telephone Encounter (Signed)
-----   Message from [name], NP sent at 07/09/2018  2:38 PM EDT ----- CBC is normal. There are no indications of anemia.

## 2018-07-12 NOTE — Telephone Encounter (Signed)
CMA spoke to patient to inform on results.  Patient understood. Patient verified DOB.

## 2018-07-13 ENCOUNTER — Other Ambulatory Visit: Payer: Self-pay | Admitting: Nurse Practitioner

## 2018-07-13 ENCOUNTER — Telehealth: Payer: Self-pay

## 2018-07-13 DIAGNOSIS — M545 Low back pain: Principal | ICD-10-CM

## 2018-07-13 DIAGNOSIS — G8929 Other chronic pain: Secondary | ICD-10-CM

## 2018-07-13 NOTE — Telephone Encounter (Signed)
CMA spoke to patient to inform on results.  Pt. Verified DOB. Patient understood.

## 2018-07-13 NOTE — Telephone Encounter (Signed)
-----   Message from [name], NP sent at 07/12/2018  7:05 PM EDT ----- PAP is abnormal and will need to be repeated in one year. 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

## 2018-07-14 DIAGNOSIS — R040 Epistaxis: Secondary | ICD-10-CM | POA: Insufficient documentation

## 2018-07-14 DIAGNOSIS — J310 Chronic rhinitis: Secondary | ICD-10-CM | POA: Insufficient documentation

## 2018-09-06 ENCOUNTER — Encounter: Payer: Self-pay | Admitting: Nurse Practitioner

## 2018-09-06 ENCOUNTER — Ambulatory Visit: Payer: BLUE CROSS/BLUE SHIELD | Attending: Nurse Practitioner | Admitting: Nurse Practitioner

## 2018-09-06 VITALS — BP 122/81 | HR 82 | Temp 99.5°F | Ht 66.0 in | Wt 154.0 lb

## 2018-09-06 DIAGNOSIS — Z833 Family history of diabetes mellitus: Secondary | ICD-10-CM | POA: Insufficient documentation

## 2018-09-06 DIAGNOSIS — E782 Mixed hyperlipidemia: Secondary | ICD-10-CM | POA: Insufficient documentation

## 2018-09-06 DIAGNOSIS — Z79899 Other long term (current) drug therapy: Secondary | ICD-10-CM | POA: Insufficient documentation

## 2018-09-06 DIAGNOSIS — Z9049 Acquired absence of other specified parts of digestive tract: Secondary | ICD-10-CM | POA: Insufficient documentation

## 2018-09-06 DIAGNOSIS — G8929 Other chronic pain: Secondary | ICD-10-CM

## 2018-09-06 DIAGNOSIS — E119 Type 2 diabetes mellitus without complications: Secondary | ICD-10-CM | POA: Insufficient documentation

## 2018-09-06 DIAGNOSIS — Z8249 Family history of ischemic heart disease and other diseases of the circulatory system: Secondary | ICD-10-CM | POA: Insufficient documentation

## 2018-09-06 DIAGNOSIS — M545 Low back pain: Secondary | ICD-10-CM | POA: Diagnosis not present

## 2018-09-06 LAB — GLUCOSE, POCT (MANUAL RESULT ENTRY): POC Glucose: 113 mg/dl — AB (ref 70–99)

## 2018-09-06 MED ORDER — IBUPROFEN 600 MG PO TABS: ORAL_TABLET | ORAL | 2 refills | Status: DC

## 2018-09-06 NOTE — Patient Instructions (Signed)

## 2018-09-06 NOTE — Progress Notes (Signed)
Assessment & Plan:  Mallory Wall was seen today for follow-up.  Diagnoses and all orders for this visit:  Diabetes mellitus without complication (HCC) -     Glucose (CBG) Continue blood sugar control as discussed in office today, low carbohydrate diet, and regular physical exercise as tolerated, 150 minutes per week (30 min each day, 5 days per week, or 50 min 3 days per week). Keep blood sugar logs with fasting goal of 90-130 mg/dl, post prandial (after you eat) less than 180.  For Hypoglycemia: BS <60 and Hyperglycemia BS >400; contact the clinic ASAP. Annual eye exams and foot exams are recommended.   Mixed hyperlipidemia -     Lipid panel INSTRUCTIONS: Work on a low fat, heart healthy diet and participate in regular aerobic exercise program by working out at least 150 minutes per week; 5 days a week-30 minutes per day. Avoid red meat, fried foods. junk foods, sodas, sugary drinks, unhealthy snacking, alcohol and smoking.  Drink at least 48oz of water per day and monitor your carbohydrate intake daily.    Chronic midline low back pain without sciatica Well controlled with:  ibuprofen (ADVIL,MOTRIN) 600 MG tablet; TAKE 1 TABLET(600 MG) BY MOUTH EVERY 8 HOURS AS NEEDED  Work on losing weight to help reduce back pain. May alternate with heat and ice application for pain relief. May also alternate with acetaminophen as prescribed for back pain. Other alternatives include massage, acupuncture and water aerobics.  You must stay active and avoid a sedentary lifestyle.     Patient has been counseled on age-appropriate routine health concerns for screening and prevention. These are reviewed and up-to-date. Referrals have been placed accordingly. Immunizations are up-to-date or declined.    Subjective:   Chief Complaint  Patient presents with  . Follow-up    Pt. is here to follow-up on diabetes and cholesterol. Pt. did not eat anything yet.     HPI [name] 57 y.o. female presents to  office today for follow up.   DM TYPE 2 Her A1c is not due today. Last A1c 6.6 in July. She denies any hypoglycemic or hyperglycemic symptoms. We discussed dietary and exercise modifications today. She has been monitoring her blood glucose 1-2 times per day.  Fasting average: 100s Postprandial average: 130s She had her diabetic retinopathy exam in July and reports there were no adverse findings aside from "needing a new prescription": Doing well. Hopefully she will continue and be able to maintain her A1c under 7.   Hyperlipidemia Patient presents for follow up to hyperlipidemia.  She is medication compliant taking omega 3 (1 gram BID).  She is not consistently diet compliant and denies exertional chest pressure/discomfort, palpitations, poor exercise tolerance and skin xanthelasma or statin intolerance including myalgias. Last LDL not at goal Lab Results  Component Value Date   CHOL 228 (H) 04/30/2018   Lab Results  Component Value Date   HDL 53 04/30/2018   Lab Results  Component Value Date   LDLCALC 159 (H) 04/30/2018   Lab Results  Component Value Date   TRIG 78 04/30/2018   Lab Results  Component Value Date   CHOLHDL 4.3 04/30/2018    Review of Systems  Constitutional: Negative for fever, malaise/fatigue and weight loss.  HENT: Negative.  Negative for nosebleeds.   Eyes: Negative.  Negative for blurred vision, double vision and photophobia.  Respiratory: Negative.  Negative for cough and shortness of breath.   Cardiovascular: Negative.  Negative for chest pain, palpitations and leg swelling.  Gastrointestinal: Negative.  Negative for heartburn, nausea and vomiting.  Musculoskeletal: Positive for myalgias. Back pain: chronic.  Neurological: Negative.  Negative for dizziness, focal weakness, seizures and headaches.  Psychiatric/Behavioral: Negative.  Negative for suicidal ideas.    Past Medical History:  Diagnosis Date  . Allergy   . Back pain   . Diabetes mellitus  without complication ([place])   . Gallstones   . Headache(784.0)   . Hyperlipidemia   . Vaginal Pap smear, abnormal     Past Surgical History:  Procedure Laterality Date  . CHOLECYSTECTOMY N/A 01/13/2018   Procedure: LAPAROSCOPIC CHOLECYSTECTOMY;  Surgeon: [name], MD;  Location: [facility];  Service: General;  Laterality: N/A;  . NO PAST SURGERIES      Family History  Problem Relation Age of Onset  . CAD Mother   . Hypertension Mother   . Diabetes type II Sister   . Lung disease Sister   . Colon cancer Neg Hx   . Esophageal cancer Neg Hx   . Pancreatic cancer Neg Hx   . Rectal cancer Neg Hx   . Stomach cancer Neg Hx     Social History Reviewed with no changes to be made today.   Outpatient Medications Prior to Visit  Medication Sig Dispense Refill  . Blood Glucose Monitoring Suppl (CONTOUR NEXT MONITOR) w/Device KIT 1 each by Does not apply route daily before breakfast. 1 kit 0  . glucose blood test strip Use as instructed 100 each 12  . MICROLET LANCETS MISC 1 each by Does not apply route daily before breakfast. 100 each 1  . omega-3 acid ethyl esters (LOVAZA) 1 g capsule Take 1 capsule (1 g total) by mouth 2 (two) times daily. 180 capsule 2  . acetaminophen (TYLENOL) 325 MG tablet Take 650 mg by mouth every 6 (six) hours as needed.    Elmore Guise Devices (MICROLET NEXT LANCING DEVICE) MISC 1 each by Does not apply route daily before breakfast. (Patient not taking: Reported on 07/06/2018) 1 each 0  . ibuprofen (ADVIL,MOTRIN) 600 MG tablet TAKE 1 TABLET(600 MG) BY MOUTH EVERY 8 HOURS AS NEEDED (Patient not taking: Reported on 09/06/2018) 60 tablet 0   No facility-administered medications prior to visit.     No Known Allergies     Objective:    BP 122/81 (BP Location: Left Arm, Patient Position: Sitting, Cuff Size: Normal)   Pulse 82   Temp 99.5 F (37.5 C) (Oral)   Ht '5\' 6"'$  (1.676 m)   Wt 154 lb (69.9 kg)   SpO2 98%   BMI 24.86 kg/m  Wt  Readings from Last 3 Encounters:  09/06/18 154 lb (69.9 kg)  07/06/18 155 lb 9.6 oz (70.6 kg)  05/31/18 156 lb 3.2 oz (70.9 kg)    Physical Exam  Constitutional: She is oriented to person, place, and time. She appears well-developed and well-nourished. She is cooperative.  HENT:  Head: Normocephalic and atraumatic.  Eyes: EOM are normal.  Neck: Normal range of motion.  Cardiovascular: Normal rate, regular rhythm, normal heart sounds and intact distal pulses. Exam reveals no gallop and no friction rub.  No murmur heard. Pulmonary/Chest: Effort normal and breath sounds normal. No tachypnea. No respiratory distress. She has no decreased breath sounds. She has no wheezes. She has no rhonchi. She has no rales. She exhibits no tenderness.  Abdominal: Soft. Bowel sounds are normal.  Musculoskeletal: Normal range of motion. She exhibits no edema.       Lumbar back:  She exhibits no swelling, no edema and no pain.  Neurological: She is alert and oriented to person, place, and time. Coordination normal.  Skin: Skin is warm and dry.  Psychiatric: She has a normal mood and affect. Her behavior is normal. Judgment and thought content normal.  Nursing note and vitals reviewed.     Patient has been counseled extensively about nutrition and exercise as well as the importance of adherence with medications and regular follow-up. The patient was given clear instructions to go to ER or return to medical center if symptoms don't improve, worsen or new problems develop. The patient verbalized understanding.   Follow-up: Return in 8 weeks (on 11/01/2018) for DM.   [name], FNP-BC [facility] and [place], [place]   09/06/2018, 1:26 PM

## 2018-09-07 ENCOUNTER — Telehealth: Payer: Self-pay

## 2018-09-07 LAB — LIPID PANEL
CHOLESTEROL TOTAL: 207 mg/dL — AB (ref 100–199)
Chol/HDL Ratio: 4.3 ratio (ref 0.0–4.4)
HDL: 48 mg/dL (ref >39)
LDL Calculated: 147 mg/dL — ABNORMAL HIGH (ref 0–99)
TRIGLYCERIDES: 60 mg/dL (ref 0–149)
VLDL CHOLESTEROL CAL: 12 mg/dL (ref 5–40)

## 2018-09-07 NOTE — Telephone Encounter (Signed)
-----   Message from [name], NP sent at 09/07/2018  8:49 AM EST ----- Cholesterol levels still elevated. Continue on your omega 3. Make sure you take 2 capsules daily

## 2018-09-07 NOTE — Telephone Encounter (Signed)
CMA spoke to patient to inform on results.  Patient verified DOB. Patient understood.  

## 2018-09-22 ENCOUNTER — Ambulatory Visit: Payer: BLUE CROSS/BLUE SHIELD | Admitting: Physician Assistant

## 2018-09-22 ENCOUNTER — Other Ambulatory Visit (HOSPITAL_COMMUNITY)
Admission: RE | Admit: 2018-09-22 | Discharge: 2018-09-22 | Disposition: A | Discharge status: Home / Self Care | Payer: BLUE CROSS/BLUE SHIELD | Source: Ambulatory Visit | Attending: Family Medicine | Admitting: Family Medicine

## 2018-09-22 VITALS — BP 139/88 | HR 73 | Temp 98.3°F | Resp 18 | Ht 62.0 in | Wt 154.0 lb

## 2018-09-22 DIAGNOSIS — E119 Type 2 diabetes mellitus without complications: Secondary | ICD-10-CM

## 2018-09-22 DIAGNOSIS — N898 Other specified noninflammatory disorders of vagina: Secondary | ICD-10-CM

## 2018-09-22 DIAGNOSIS — R110 Nausea: Secondary | ICD-10-CM | POA: Diagnosis not present

## 2018-09-22 LAB — POCT URINALYSIS DIP (CLINITEK)
BILIRUBIN UA: NEGATIVE mg/dL
Blood, UA: NEGATIVE
Glucose, UA: NEGATIVE mg/dL
Leukocytes, UA: NEGATIVE
Nitrite, UA: NEGATIVE
UROBILINOGEN UA: 1 U/dL
pH, UA: 5.5 (ref 5.0–8.0)

## 2018-09-22 LAB — GLUCOSE, POCT (MANUAL RESULT ENTRY): POC Glucose: 121 mg/dl — AB (ref 70–99)

## 2018-09-22 MED ORDER — ONDANSETRON HCL 4 MG PO TABS: 4.0000 mg | ORAL_TABLET | Freq: Three times a day (TID) | ORAL | 0 refills | Status: DC | PRN

## 2018-09-22 MED ORDER — METRONIDAZOLE 500 MG PO TABS: 500.0000 mg | ORAL_TABLET | Freq: Two times a day (BID) | ORAL | 0 refills | Status: DC

## 2018-09-22 NOTE — Patient Instructions (Addendum)
Drink 60-80 water daily Take probiotics daily  Vaginosis Bacterial vaginosis is an infection of the vagina. It happens when too many germs (bacteria) grow in the vagina. This infection puts you at risk for infections from sex (STIs). Treating this infection can lower your risk for some STIs. You should also treat this if you are pregnant. It can cause your baby to be born early. Follow these instructions at home: Medicines  Take over-the-counter and prescription medicines only as told by your doctor.  Take or use your antibiotic medicine as told by your doctor. Do not stop taking or using it even if you start to feel better. General instructions  If you your sexual partner is a woman, tell her that you have this infection. She needs to get treatment if she has symptoms. If you have a female partner, he does not need to be treated.  During treatment: ? Avoid sex. ? Do not douche. ? Avoid alcohol as told. ? Avoid breastfeeding as told.  Drink enough fluid to keep your pee (urine) clear or pale yellow.  Keep your vagina and butt (rectum) clean. ? Wash the area with warm water every day. ? Wipe from front to back after you use the toilet.  Keep all follow-up visits as told by your doctor. This is important. Preventing this condition  Do not douche.  Use only warm water to wash around your vagina.  Use protection when you have sex. This includes: ? Latex condoms. ? Dental dams.  Limit how many people you have sex with. It is best to only have sex with the same person (be monogamous).  Get tested for STIs. Have your partner get tested.  Wear underwear that is cotton or lined with cotton.  Avoid tight pants and pantyhose. This is most important in summer.  Do not use any products that have nicotine or tobacco in them. These include cigarettes and e-cigarettes. If you need help quitting, ask your doctor.  Do not use illegal drugs.  Limit how much alcohol you drink. Contact a  doctor if:  Your symptoms do not get better, even after you are treated.  You have more discharge or pain when you pee (urinate).  You have a fever.  You have pain in your belly (abdomen).  You have pain with sex.  Your bleed from your vagina between periods. Summary  This infection happens when too many germs (bacteria) grow in the vagina.  Treating this condition can lower your risk for some infections from sex (STIs).  You should also treat this if you are pregnant. It can cause early (premature) birth.  Do not stop taking or using your antibiotic medicine even if you start to feel better. This information is not intended to replace advice given to you by your health care provider. Make sure you discuss any questions you have with your health care provider. Document Released: 07/22/2008 Document Revised: 06/28/2016 Document Reviewed: 06/28/2016 Elsevier Interactive Patient Education  2017 [employer].

## 2018-09-22 NOTE — Progress Notes (Signed)
Mallory Wall, is a 57 y.o. female  TDV:[id number]  GGY:[id number]  DOB - 1961/04/23  Subjective:  Chief Complaint and HPI: Mallory Wall is a 57 y.o. female here today for vaginal discharge.  Not SA.  Has felt/had this with BV in the past.  Some nausea that is intermittent but metronidazole always makes her nauseous.  No fever.  No vomiting.  No abdominal or pelvic pain.     ROS:   Constitutional:  No f/c, No night sweats, No unexplained weight loss. EENT:  No vision changes, No blurry vision, No hearing changes. No mouth, throat, or ear problems.  Respiratory: No cough, No SOB Cardiac: No CP, no palpitations GI:  No abd pain, + N, no V/D. GU: No Urinary s/sx Musculoskeletal: No joint pain Neuro: No headache, no dizziness, no motor weakness.  Skin: No rash Endocrine:  No polydipsia. No polyuria.  Psych: Denies SI/HI  No problems updated.  ALLERGIES: No Known Allergies  PAST MEDICAL HISTORY: Past Medical History:  Diagnosis Date  . Allergy   . Back pain   . Diabetes mellitus without complication ([place])   . Gallstones   . Headache(784.0)   . Hyperlipidemia   . Vaginal Pap smear, abnormal     MEDICATIONS AT HOME: Prior to Admission medications   Medication Sig Start Date End Date Taking? Authorizing Provider  acetaminophen (TYLENOL) 325 MG tablet Take 650 mg by mouth every 6 (six) hours as needed.   Yes [provider]  Blood Glucose Monitoring Suppl (CONTOUR NEXT MONITOR) w/Device KIT 1 each by Does not apply route daily before breakfast. 05/31/18  Yes [name], NP  glucose blood test strip Use as instructed 05/31/18  Yes [name], NP  ibuprofen (ADVIL,MOTRIN) 600 MG tablet TAKE 1 TABLET(600 MG) BY MOUTH EVERY 8 HOURS AS NEEDED 09/06/18  Yes [name], NP  Lancet Devices (MICROLET NEXT LANCING DEVICE) MISC 1 each by Does not apply route daily before breakfast. 05/31/18  Yes [name], NP  MICROLET LANCETS MISC 1 each by Does not  apply route daily before breakfast. 05/31/18  Yes [name], NP  omega-3 acid ethyl esters (LOVAZA) 1 g capsule Take 1 capsule (1 g total) by mouth 2 (two) times daily. 07/06/18 10/04/18 Yes [name], NP  metroNIDAZOLE (FLAGYL) 500 MG tablet Take 1 tablet (500 mg total) by mouth 2 (two) times daily. 09/22/18   Argentina Donovan, PA-C  ondansetron (ZOFRAN) 4 MG tablet Take 1 tablet (4 mg total) by mouth every 8 (eight) hours as needed for nausea or vomiting. 09/22/18   Argentina Donovan, PA-C     Objective:  EXAM:   Vitals:   09/22/18 0920  BP: 139/88  Pulse: 73  Resp: 18  Temp: 98.3 F (36.8 C)  TempSrc: Oral  SpO2: 99%  Weight: 154 lb (69.9 kg)  Height: _0  (1.575 m)    General appearance : A&OX3. NAD. Non-toxic-appearing HEENT: Atraumatic and Normocephalic.  PERRLA. EOM intact.  Neck: supple, no JVD. No cervical lymphadenopathy. No thyromegaly Chest/Lungs:  Breathing-non-labored, Good air entry bilaterally, breath sounds normal without rales, rhonchi, or wheezing  CVS: S1 S2 regular, no murmurs, gallops, rubs  Neurology:  CN II-XII grossly intact, Non focal.   Psych:  TP linear. J/I WNL. Normal speech. Appropriate eye contact and affect.  Skin:  No Rash  Data Review Lab Results  Component Value Date   HGBA1C 6.6 (A) 04/30/2018   HGBA1C 6.8 (H) 02/12/2016  HGBA1C 6.40 06/11/2015     Assessment & Plan   1. Vaginal discharge Will cover for BV as she has had this numerous times.  Metronidazole 579m bid X 7 days.  No alcohol(esp bc holidays).  Take probiotics and drink adequate water - POCT URINALYSIS DIP (CLINITEK) - Urine cytology ancillary only  2. Diabetes mellitus without complication ([place] Controlled.  Continue current regimen.   - Glucose (CBG)  3. Nausea zofran sent.     Patient have been counseled extensively about nutrition and exercise  Return for appt with ZGeryl Rankins1/03/2018.  The patient was given clear instructions to go to ER  or return to medical center if symptoms don't improve, worsen or new problems develop. The patient verbalized understanding. The patient was told to call to get lab results if they haven't heard anything in the next week.     AFreeman Caldron PA-C [facility] [place]  09/22/2018, 9:43 AM

## 2018-09-24 LAB — URINE CYTOLOGY ANCILLARY ONLY
Chlamydia: NEGATIVE
Neisseria Gonorrhea: NEGATIVE
Trichomonas: NEGATIVE

## 2018-09-25 LAB — URINE CYTOLOGY ANCILLARY ONLY: Candida vaginitis: NEGATIVE

## 2018-09-28 ENCOUNTER — Telehealth: Payer: Self-pay | Admitting: *Deleted

## 2018-09-28 ENCOUNTER — Telehealth: Payer: Self-pay | Admitting: Nurse Practitioner

## 2018-09-28 NOTE — Telephone Encounter (Signed)
Medical Assistant left message on patient's home and cell voicemail. Voicemail states to give a call back to [place] with [facility] at [phone]. !!!Please inform patient of BV being positive and no yeast or STD being noted. Patient needs to complete flagyl prescription from the day of her visit and follow up as planned!!!

## 2018-09-28 NOTE — Telephone Encounter (Signed)
Pt called back, verified DOB and received note left by her nurse. She had no further questions or concerns

## 2018-09-28 NOTE — Telephone Encounter (Signed)
-----   Message from Argentina Donovan, [place] sent at 09/28/2018  2:05 PM EST ----- Please call patient.  Her test was positive for BV.  No yeast.  No STD.  The metronidazole I already prescribed her the day of her visit will take care of it.  Follow-up as planned.  Thanks, Freeman Caldron, PA-C

## 2018-11-01 ENCOUNTER — Ambulatory Visit: Payer: Self-pay | Admitting: Nurse Practitioner

## 2018-11-26 ENCOUNTER — Encounter: Payer: Self-pay | Admitting: Nurse Practitioner

## 2018-11-26 ENCOUNTER — Ambulatory Visit: Payer: Self-pay | Attending: Nurse Practitioner | Admitting: Nurse Practitioner

## 2018-11-26 VITALS — BP 136/84 | HR 79 | Temp 97.7°F | Ht 62.0 in | Wt 157.4 lb

## 2018-11-26 DIAGNOSIS — R102 Pelvic and perineal pain: Secondary | ICD-10-CM

## 2018-11-26 DIAGNOSIS — E119 Type 2 diabetes mellitus without complications: Secondary | ICD-10-CM

## 2018-11-26 DIAGNOSIS — M25562 Pain in left knee: Secondary | ICD-10-CM

## 2018-11-26 DIAGNOSIS — G8929 Other chronic pain: Secondary | ICD-10-CM

## 2018-11-26 LAB — POCT GLYCOSYLATED HEMOGLOBIN (HGB A1C): HBA1C, POC (PREDIABETIC RANGE): 6.7 % — AB (ref 5.7–6.4)

## 2018-11-26 MED ORDER — DICLOFENAC SODIUM 1 % TD GEL: 2.0000 g | Freq: Four times a day (QID) | TRANSDERMAL | 1 refills | Status: AC

## 2018-11-26 MED FILL — DICLOFENAC SODIUM 1% GEL: 1 | 30 days supply | Qty: 100 | Fill #0

## 2018-11-26 NOTE — Progress Notes (Signed)
Assessment & Plan:  Mallory Wall was seen today for diabetes.  Diagnoses and all orders for this visit:  Diabetes mellitus without complication ([place]) -     POCT glycosylated hemoglobin (Hb A1C) Continue blood sugar control as discussed in office today, low carbohydrate diet, and regular physical exercise as tolerated, 150 minutes per week (30 min each day, 5 days per week, or 50 min 3 days per week). Keep blood sugar logs with fasting goal of 90-130 mg/dl, post prandial (after you eat) less than 180.  For Hypoglycemia: BS <60 and Hyperglycemia BS >400; contact the clinic ASAP. Annual eye exams and foot exams are recommended.  Chronic pain of left knee -     diclofenac sodium (VOLTAREN) 1 % GEL; Apply 2 g topically 4 (four) times daily for 30 days. Work on losing weight to help reduce joint pain. May alternate with heat and ice application for pain relief. May also alternate with acetaminophen  as prescribed pain relief. Other alternatives include massage, acupuncture and water aerobics.  You must stay active and avoid a sedentary lifestyle.  Pelvic pain -     US PELVIC COMPLETE WITH TRANSVAGINAL; Future    Patient has been counseled on age-appropriate routine health concerns for screening and prevention. These are reviewed and up-to-date. Referrals have been placed accordingly. Immunizations are up-to-date or declined.    Subjective:   Chief Complaint  Patient presents with  . Diabetes   HPI Mallory Wall 58 y.o. female presents to office today for follow up to DM.  DM TYPE 2 Chronic and well controlled with diet and weight management. She denies any hypo or hyperglycemic symptoms. She is overdue for eye exam. Patient has been advised to apply for financial assistance and schedule to see our financial counselor.  Lab Results  Component Value Date   HGBA1C 6.7 (A) 11/26/2018    Joint/Muscle Pain: Patient complains of myalgias for which has been present for a few months. Pain is  located in multiple joints including the left hip and left knee, is described as aching, boring and dull, and is mildAssociated symptoms include: swelling.  Related to injury:   No.  Aggravating factors: Walking and lying down in the bed. Relieving factors: Pain is rated as 4/10 today. Has tried heating pad with little relief and ibuprofen '600mg'$  as prescribed.   Abdominal Pain: Patient complains of lowe pelvic pain. The pain is described as aching and pressure-like, and is 4/10 in intensity. Pain is located in the LLQ, RLQ, and pelvic region with radiation to back. Onset was several months ago. Symptoms have been stable since. Aggravating factors: none.  Alleviating factors: none. Associated symptoms: constipation. The patient denies chills, diarrhea, dysuria, fever, hematochezia, hematuria, melena, nausea and vomiting.  Pain sometimes goes way after having a bowel movement.    Review of Systems  Constitutional: Negative for fever, malaise/fatigue and weight loss.  HENT: Negative.  Negative for nosebleeds.   Eyes: Negative.  Negative for blurred vision, double vision and photophobia.  Respiratory: Negative.  Negative for cough and shortness of breath.   Cardiovascular: Negative.  Negative for chest pain, palpitations and leg swelling.  Gastrointestinal: Positive for abdominal pain and constipation. Negative for blood in stool, diarrhea, heartburn, melena, nausea and vomiting.  Genitourinary: Negative.   Musculoskeletal: Positive for back pain and joint pain. Negative for myalgias.  Neurological: Negative.  Negative for dizziness, focal weakness, seizures and headaches.  Psychiatric/Behavioral: Negative.  Negative for suicidal ideas.    Past Medical History:  Diagnosis Date  . Allergy   . Back pain   . Diabetes mellitus without complication ([place])   . Gallstones   . Headache(784.0)   . Hyperlipidemia   . Vaginal Pap smear, abnormal     Past Surgical History:  Procedure Laterality Date  .  CHOLECYSTECTOMY N/A 01/13/2018   Procedure: LAPAROSCOPIC CHOLECYSTECTOMY;  Surgeon: [name], MD;  Location: [place];  Service: General;  Laterality: N/A;  . NO PAST SURGERIES      Family History  Problem Relation Age of Onset  . CAD Mother   . Hypertension Mother   . Diabetes type II Sister   . Lung disease Sister   . Colon cancer Neg Hx   . Esophageal cancer Neg Hx   . Pancreatic cancer Neg Hx   . Rectal cancer Neg Hx   . Stomach cancer Neg Hx     Social History Reviewed with no changes to be made today.   Outpatient Medications Prior to Visit  Medication Sig Dispense Refill  . Blood Glucose Monitoring Suppl (CONTOUR NEXT MONITOR) w/Device KIT 1 each by Does not apply route daily before breakfast. 1 kit 0  . glucose blood test strip Use as instructed 100 each 12  . Lancet Devices (MICROLET NEXT LANCING DEVICE) MISC 1 each by Does not apply route daily before breakfast. 1 each 0  . acetaminophen (TYLENOL) 325 MG tablet Take 650 mg by mouth every 6 (six) hours as needed.    Marland Kitchen MICROLET LANCETS MISC 1 each by Does not apply route daily before breakfast. (Patient not taking: Reported on 11/26/2018) 100 each 1  . omega-3 acid ethyl esters (LOVAZA) 1 g capsule Take 1 capsule (1 g total) by mouth 2 (two) times daily. 180 capsule 2  . ondansetron (ZOFRAN) 4 MG tablet Take 1 tablet (4 mg total) by mouth every 8 (eight) hours as needed for nausea or vomiting. (Patient not taking: Reported on 11/26/2018) 20 tablet 0  . ibuprofen (ADVIL,MOTRIN) 600 MG tablet TAKE 1 TABLET(600 MG) BY MOUTH EVERY 8 HOURS AS NEEDED (Patient not taking: Reported on 11/26/2018) 60 tablet 2  . metroNIDAZOLE (FLAGYL) 500 MG tablet Take 1 tablet (500 mg total) by mouth 2 (two) times daily. (Patient not taking: Reported on 11/26/2018) 14 tablet 0   No facility-administered medications prior to visit.     No Known Allergies     Objective:    BP 136/84   Pulse 79   Temp 97.7 F (36.5 C)  (Oral)   Ht '5\' 2"'$  (1.575 m)   Wt 157 lb 6.4 oz (71.4 kg)   SpO2 99%   BMI 28.79 kg/m  Wt Readings from Last 3 Encounters:  11/26/18 157 lb 6.4 oz (71.4 kg)  09/22/18 154 lb (69.9 kg)  09/06/18 154 lb (69.9 kg)    Physical Exam Vitals signs and nursing note reviewed.  Constitutional:      Appearance: She is well-developed.  HENT:     Head: Normocephalic and atraumatic.  Neck:     Musculoskeletal: Normal range of motion.  Cardiovascular:     Rate and Rhythm: Normal rate and regular rhythm.     Heart sounds: Normal heart sounds. No murmur. No friction rub. No gallop.   Pulmonary:     Effort: Pulmonary effort is normal. No tachypnea or respiratory distress.     Breath sounds: Normal breath sounds. No decreased breath sounds, wheezing, rhonchi or rales.  Chest:     Chest wall: No tenderness.  Abdominal:     General: Bowel sounds are normal.     Palpations: Abdomen is soft.     Tenderness: There is abdominal tenderness in the periumbilical area and suprapubic area.  Musculoskeletal: Normal range of motion.     Left hip: Normal.     Left knee: She exhibits normal range of motion and no swelling. No tenderness found.  Skin:    General: Skin is warm and dry.  Neurological:     Mental Status: She is alert and oriented to person, place, and time.     Coordination: Coordination normal.  Psychiatric:        Behavior: Behavior normal. Behavior is cooperative.        Thought Content: Thought content normal.        Judgment: Judgment normal.          Patient has been counseled extensively about nutrition and exercise as well as the importance of adherence with medications and regular follow-up. The patient was given clear instructions to go to ER or return to medical center if symptoms don't improve, worsen or new problems develop. The patient verbalized understanding.   Follow-up: Return in about 4 weeks (around 12/24/2018) for knee pain and pelvic pain.   [name],  FNP-BC [facility] [place], [place]   11/27/2018, 10:49 AM

## 2018-11-26 NOTE — Progress Notes (Signed)
Patient has had body pain for 1 month.

## 2018-11-27 ENCOUNTER — Encounter: Payer: Self-pay | Admitting: Nurse Practitioner

## 2018-12-27 ENCOUNTER — Ambulatory Visit: Payer: Self-pay | Admitting: Nurse Practitioner

## 2018-12-28 ENCOUNTER — Encounter: Payer: Self-pay | Admitting: Nurse Practitioner

## 2018-12-28 ENCOUNTER — Ambulatory Visit: Payer: Self-pay | Attending: Nurse Practitioner | Admitting: Nurse Practitioner

## 2018-12-28 VITALS — BP 133/86 | HR 74 | Temp 98.2°F | Ht 62.0 in | Wt 163.4 lb

## 2018-12-28 DIAGNOSIS — M25562 Pain in left knee: Secondary | ICD-10-CM

## 2018-12-28 DIAGNOSIS — G8929 Other chronic pain: Secondary | ICD-10-CM

## 2018-12-28 DIAGNOSIS — E119 Type 2 diabetes mellitus without complications: Secondary | ICD-10-CM

## 2018-12-28 DIAGNOSIS — Z789 Other specified health status: Secondary | ICD-10-CM

## 2018-12-28 LAB — GLUCOSE, POCT (MANUAL RESULT ENTRY): POC Glucose: 145 mg/dl — AB (ref 70–99)

## 2018-12-28 MED ORDER — OMEGA-3-ACID ETHYL ESTERS 1 G PO CAPS: 1.0000 g | ORAL_CAPSULE | Freq: Two times a day (BID) | ORAL | 3 refills | Status: DC

## 2018-12-28 MED FILL — OMEGA-3 ETHYL ESTERS 1 GM C: 1 | 30 days supply | Qty: 60 | Fill #0

## 2018-12-28 NOTE — Progress Notes (Signed)
Assessment & Plan:  Mallory Wall was seen today for follow-up.  Diagnoses and all orders for this visit:  Chronic pain of left knee Continue diclofenac gel as prescribed.  Work on losing weight to help reduce joint pain. May alternate with heat and ice application for pain relief. May also alternate with acetaminophen as prescribed pain relief. Other alternatives include massage, acupuncture and water aerobics.  You must stay active and avoid a sedentary lifestyle.   Diabetes mellitus without complication ([facility]) -     Glucose (CBG) Well controlled. Denies any hypo or hyperglycemic symptoms. Diet controlled at this time.  Statin intolerance -     omega-3 acid ethyl esters (LOVAZA) 1 g capsule; Take 1 capsule (1 g total) by mouth 2 (two) times daily. LDL not at goal. Lipid panel drawn today. May need to increase lovaza or add crestor.  Lab Results  Component Value Date   LDLCALC 147 (H) 09/06/2018      Patient has been counseled on age-appropriate routine health concerns for screening and prevention. These are reviewed and up-to-date. Referrals have been placed accordingly. Immunizations are up-to-date or declined.    Subjective:   Chief Complaint  Patient presents with  . Follow-up    Pt. stated her knee pain is much better.   HPI Mallory Wall 58 y.o. female presents to office today for follow up to left knee pain.    Left Knee Pain Chronic, aching, boring and dull pain. Denies injury or trauma. Associated symptoms: intermittent swelling. Aggravating factors: walking, standing for long periods of time and lying in bed. Relieving factors: VOLTAREN gel. Pain 0/10 today. She endorses signficant relief of symptoms with diclofenac gel. Will continue at this time.    Review of Systems  Constitutional: Negative for fever, malaise/fatigue and weight loss.  HENT: Negative.  Negative for nosebleeds.   Eyes: Negative.  Negative for blurred vision, double vision and photophobia.    Respiratory: Negative.  Negative for cough and shortness of breath.   Cardiovascular: Negative.  Negative for chest pain, palpitations and leg swelling.  Gastrointestinal: Negative.  Negative for heartburn, nausea and vomiting.  Musculoskeletal: Positive for joint pain. Negative for myalgias.  Neurological: Negative.  Negative for dizziness, focal weakness, seizures and headaches.  Psychiatric/Behavioral: Negative.  Negative for suicidal ideas.    Past Medical History:  Diagnosis Date  . Allergy   . Back pain   . Diabetes mellitus without complication ([place])   . Gallstones   . Headache(784.0)   . Hyperlipidemia   . Vaginal Pap smear, abnormal     Past Surgical History:  Procedure Laterality Date  . CHOLECYSTECTOMY N/A 01/13/2018   Procedure: LAPAROSCOPIC CHOLECYSTECTOMY;  Surgeon: [name], MD;  Location: [place];  Service: General;  Laterality: N/A;  . NO PAST SURGERIES      Family History  Problem Relation Age of Onset  . CAD Mother   . Hypertension Mother   . Diabetes type II Sister   . Lung disease Sister   . Colon cancer Neg Hx   . Esophageal cancer Neg Hx   . Pancreatic cancer Neg Hx   . Rectal cancer Neg Hx   . Stomach cancer Neg Hx     Social History Reviewed with no changes to be made today.   Outpatient Medications Prior to Visit  Medication Sig Dispense Refill  . acetaminophen (TYLENOL) 325 MG tablet Take 650 mg by mouth every 6 (six) hours as needed.    . Blood Glucose  Monitoring Suppl (CONTOUR NEXT MONITOR) w/Device KIT 1 each by Does not apply route daily before breakfast. 1 kit 0  . glucose blood test strip Use as instructed 100 each 12  . Lancet Devices (MICROLET NEXT LANCING DEVICE) MISC 1 each by Does not apply route daily before breakfast. 1 each 0  . MICROLET LANCETS MISC 1 each by Does not apply route daily before breakfast. (Patient not taking: Reported on 11/26/2018) 100 each 1  . ondansetron (ZOFRAN) 4 MG tablet Take 1  tablet (4 mg total) by mouth every 8 (eight) hours as needed for nausea or vomiting. (Patient not taking: Reported on 11/26/2018) 20 tablet 0  . omega-3 acid ethyl esters (LOVAZA) 1 g capsule Take 1 capsule (1 g total) by mouth 2 (two) times daily. 180 capsule 2   No facility-administered medications prior to visit.     No Known Allergies     Objective:    BP 133/86 (BP Location: Left Arm, Patient Position: Sitting, Cuff Size: Normal)   Pulse 74   Temp 98.2 F (36.8 C) (Oral)   Ht 5' 2" (1.575 m)   Wt 163 lb 6.4 oz (74.1 kg)   SpO2 99%   BMI 29.89 kg/m  Wt Readings from Last 3 Encounters:  12/28/18 163 lb 6.4 oz (74.1 kg)  11/26/18 157 lb 6.4 oz (71.4 kg)  09/22/18 154 lb (69.9 kg)    Physical Exam Vitals signs and nursing note reviewed.  Constitutional:      Appearance: She is well-developed.  HENT:     Head: Normocephalic and atraumatic.  Neck:     Musculoskeletal: Normal range of motion.  Cardiovascular:     Rate and Rhythm: Normal rate and regular rhythm.     Heart sounds: Normal heart sounds. No murmur. No friction rub. No gallop.   Pulmonary:     Effort: Pulmonary effort is normal. No tachypnea or respiratory distress.     Breath sounds: Normal breath sounds. No decreased breath sounds, wheezing, rhonchi or rales.  Chest:     Chest wall: No tenderness.  Abdominal:     General: Bowel sounds are normal.     Palpations: Abdomen is soft.  Musculoskeletal: Normal range of motion.     Left knee: She exhibits normal range of motion and no swelling. No tenderness found.  Skin:    General: Skin is warm and dry.  Neurological:     Mental Status: She is alert and oriented to person, place, and time.     Coordination: Coordination normal.  Psychiatric:        Behavior: Behavior normal. Behavior is cooperative.        Thought Content: Thought content normal.        Judgment: Judgment normal.          Patient has been counseled extensively about nutrition and  exercise as well as the importance of adherence with medications and regular follow-up. The patient was given clear instructions to go to ER or return to medical center if symptoms don't improve, worsen or new problems develop. The patient verbalized understanding.   Follow-up: Return in about 4 years (around 12/28/2022) for DM.   [name], FNP-BC [facility] and [place], [place]   12/28/2018, 9:45 AM

## 2018-12-29 ENCOUNTER — Other Ambulatory Visit: Payer: Self-pay | Admitting: Nurse Practitioner

## 2018-12-29 LAB — CMP14+EGFR
ALK PHOS: 86 IU/L (ref 39–117)
ALT: 10 IU/L (ref 0–32)
AST: 12 IU/L (ref 0–40)
Albumin/Globulin Ratio: 1.3 (ref 1.2–2.2)
Albumin: 4.2 g/dL (ref 3.8–4.9)
BILIRUBIN TOTAL: 0.3 mg/dL (ref 0.0–1.2)
BUN/Creatinine Ratio: 18 (ref 9–23)
BUN: 13 mg/dL (ref 6–24)
CHLORIDE: 99 mmol/L (ref 96–106)
CO2: 23 mmol/L (ref 20–29)
Calcium: 9.5 mg/dL (ref 8.7–10.2)
Creatinine, Ser: 0.71 mg/dL (ref 0.57–1.00)
GFR calc Af Amer: 109 mL/min/{1.73_m2} (ref >59)
GFR calc non Af Amer: 95 mL/min/{1.73_m2} (ref >59)
GLUCOSE: 127 mg/dL — AB (ref 65–99)
Globulin, Total: 3.2 g/dL (ref 1.5–4.5)
POTASSIUM: 4.7 mmol/L (ref 3.5–5.2)
Sodium: 139 mmol/L (ref 134–144)
TOTAL PROTEIN: 7.4 g/dL (ref 6.0–8.5)

## 2018-12-29 LAB — LIPID PANEL
Chol/HDL Ratio: 4.1 ratio (ref 0.0–4.4)
Cholesterol, Total: 201 mg/dL — ABNORMAL HIGH (ref 100–199)
HDL: 49 mg/dL (ref >39)
LDL Calculated: 141 mg/dL — ABNORMAL HIGH (ref 0–99)
TRIGLYCERIDES: 57 mg/dL (ref 0–149)
VLDL CHOLESTEROL CAL: 11 mg/dL (ref 5–40)

## 2018-12-29 LAB — CBC
Hematocrit: 40 % (ref 34.0–46.6)
Hemoglobin: 12.8 g/dL (ref 11.1–15.9)
MCH: 29.3 pg (ref 26.6–33.0)
MCHC: 32 g/dL (ref 31.5–35.7)
MCV: 92 fL (ref 79–97)
PLATELETS: 350 10*3/uL (ref 150–450)
RBC: 4.37 x10E6/uL (ref 3.77–5.28)
RDW: 13.2 % (ref 11.7–15.4)
WBC: 6.1 10*3/uL (ref 3.4–10.8)

## 2018-12-29 MED ORDER — ROSUVASTATIN CALCIUM 5 MG PO TABS: 5.0000 mg | ORAL_TABLET | Freq: Every day | ORAL | 3 refills | Status: DC

## 2018-12-29 MED FILL — ROSUVASTATIN CALCIUM 5 MG T: 5 | 30 days supply | Qty: 30 | Fill #0

## 2018-12-30 ENCOUNTER — Telehealth: Payer: Self-pay

## 2018-12-30 NOTE — Telephone Encounter (Signed)
-----   Message from [name], NP sent at 12/29/2018 10:17 AM EST ----- Most labs are normal including kidney and liver function. There is no anemia. However cholesterol levels still elevated. Your risk of having a heart attack or stroke in the next 10 years is 29% if we do not lower your cholesterol. I have sent in another medication for you to take along with omega 3

## 2018-12-30 NOTE — Telephone Encounter (Signed)
CMA spoke to patient to inform on results.  Pt. Verified DOB. Pt. Understood.  Pt. Is aware of Rx for pick up.

## 2019-01-03 ENCOUNTER — Telehealth: Payer: Self-pay | Admitting: Nurse Practitioner

## 2019-01-03 ENCOUNTER — Ambulatory Visit: Payer: Self-pay | Attending: Nurse Practitioner

## 2019-01-03 NOTE — Telephone Encounter (Signed)
Pt called informing that her Bank will send her bank statements to her home and it will arrive in the next couple days we both agreed that 01/13/2019 will be the new deadline date.

## 2019-01-03 NOTE — Telephone Encounter (Signed)
Called Pt in regards to her CAFA application, we are missing the last 90 days of her acct ending in [id number], pt understood and agreed to the deadline 01/10/2019.

## 2019-01-31 MED FILL — ROSUVASTATIN CALCIUM 5 MG T: 5 | 90 days supply | Qty: 90 | Fill #1

## 2019-02-23 ENCOUNTER — Telehealth: Payer: Self-pay | Admitting: Nurse Practitioner

## 2019-02-23 ENCOUNTER — Other Ambulatory Visit: Payer: Self-pay | Admitting: Nurse Practitioner

## 2019-02-23 DIAGNOSIS — E119 Type 2 diabetes mellitus without complications: Secondary | ICD-10-CM

## 2019-02-23 MED ORDER — GLUCOSE BLOOD VI STRP: ORAL_STRIP | 12 refills | Status: DC

## 2019-02-23 MED ORDER — METFORMIN HCL 500 MG PO TABS: 500.0000 mg | ORAL_TABLET | Freq: Every day | ORAL | 3 refills | Status: DC

## 2019-02-23 MED FILL — TRUE METRIX TEST STRIP: 50 days supply | Qty: 100 | Fill #0

## 2019-02-23 MED FILL — metFORMIN HCL 500 MG TABS: 500 | 30 days supply | Qty: 30 | Fill #0

## 2019-02-23 NOTE — Telephone Encounter (Signed)
Pt called in stated she was having dizziness and has been feeling lightheaded aswell as elevated blood sugar would like to sed if should could get something prescribed

## 2019-02-23 NOTE — Telephone Encounter (Signed)
Pt. Stated her sugar fasting was 260's. She stated she is feeling dizzy and lightheaded after she step out. Before she left the house she checked her blood sugar was 109. After she drank some water it went down to 96 and then jump back up to 160.  Pt. Is concern with her blood sugar jumping up and down.

## 2019-02-23 NOTE — Telephone Encounter (Signed)
Metformin has been sent.

## 2019-02-23 NOTE — Telephone Encounter (Signed)
CMA spoke to patient to inform on medication sent.  Pt. Understood.

## 2019-03-07 ENCOUNTER — Telehealth: Payer: Self-pay | Admitting: Nurse Practitioner

## 2019-03-07 DIAGNOSIS — E119 Type 2 diabetes mellitus without complications: Secondary | ICD-10-CM

## 2019-03-07 MED ORDER — GLUCOSE BLOOD VI STRP: ORAL_STRIP | 12 refills | Status: DC

## 2019-03-07 MED ORDER — TRUE METRIX METER W/DEVICE KIT: PACK | 0 refills | Status: DC

## 2019-03-07 MED ORDER — TRUEPLUS LANCETS 28G MISC: 12 refills | Status: DC

## 2019-03-07 MED FILL — TRUEplus LANCETS 28G MISC: 25 days supply | Qty: 100 | Fill #0

## 2019-03-07 MED FILL — !TRUE METRIX BLOOD GLUCOSE: 1 days supply | Qty: 1 | Fill #0

## 2019-03-07 NOTE — Telephone Encounter (Signed)
New Message   Pt states she was sent the wrong test strips and now she doesn't have any strips. She uses [facility]. Please f/u

## 2019-03-07 NOTE — Telephone Encounter (Signed)
CMA spoke to patient.  Patient have True Metrix test strip.  CMA was informed patient can get meter free with True Metrix.  CMA send it over for patient [facility] pharmacy, patient is aware.

## 2019-03-18 MED FILL — OMEGA-3 ETHYL ESTERS 1 GM C: 1 | 30 days supply | Qty: 60 | Fill #1

## 2019-03-30 ENCOUNTER — Other Ambulatory Visit: Payer: Self-pay | Admitting: Nurse Practitioner

## 2019-03-30 DIAGNOSIS — E119 Type 2 diabetes mellitus without complications: Secondary | ICD-10-CM

## 2019-03-30 MED FILL — ?METFORMIN HCL 500MG TABLET: 500 | 30 days supply | Qty: 30 | Fill #1

## 2019-04-07 MED FILL — TRUE METRIX TEST STRIP: 50 days supply | Qty: 100 | Fill #1

## 2019-04-22 MED FILL — OMEGA-3 ETHYL ESTERS 1 GM C: 1 | 30 days supply | Qty: 60 | Fill #2

## 2019-05-02 ENCOUNTER — Ambulatory Visit: Payer: Self-pay | Attending: Nurse Practitioner | Admitting: Nurse Practitioner

## 2019-05-02 ENCOUNTER — Other Ambulatory Visit: Payer: Self-pay

## 2019-05-02 ENCOUNTER — Encounter: Payer: Self-pay | Admitting: Nurse Practitioner

## 2019-05-02 DIAGNOSIS — Z789 Other specified health status: Secondary | ICD-10-CM

## 2019-05-02 DIAGNOSIS — E785 Hyperlipidemia, unspecified: Secondary | ICD-10-CM

## 2019-05-02 DIAGNOSIS — E119 Type 2 diabetes mellitus without complications: Secondary | ICD-10-CM

## 2019-05-02 MED ORDER — ROSUVASTATIN CALCIUM 5 MG PO TABS: 5.0000 mg | ORAL_TABLET | Freq: Every day | ORAL | 3 refills | Status: DC

## 2019-05-02 MED ORDER — OMEGA-3-ACID ETHYL ESTERS 1 G PO CAPS: 1.0000 g | ORAL_CAPSULE | Freq: Two times a day (BID) | ORAL | 3 refills | Status: DC

## 2019-05-02 MED ORDER — METFORMIN HCL 500 MG PO TABS: 500.0000 mg | ORAL_TABLET | Freq: Every day | ORAL | 3 refills | Status: DC

## 2019-05-02 MED FILL — metFORMIN HCL 500 MG TABS: 500 | 30 days supply | Qty: 30 | Fill #0

## 2019-05-02 MED FILL — OMEGA-3 ETHYL ESTERS 1 GM C: 1 | 30 days supply | Qty: 60 | Fill #0

## 2019-05-02 MED FILL — ?ROSUVASTATIN CALCIUM 5MG T: 5 | 30 days supply | Qty: 30 | Fill #0

## 2019-05-02 NOTE — Progress Notes (Signed)
Virtual Visit via Telephone Note Due to national recommendations of social distancing due to [place] 19, telehealth visit is felt to be most appropriate for this patient at this time.  I discussed the limitations, risks, security and privacy concerns of performing an evaluation and management service by telephone and the availability of in person appointments. I also discussed with the patient that there may be a patient responsible charge related to this service. The patient expressed understanding and agreed to proceed.    I connected with Mallory Wall on 05/02/19  at  11:10 AM EDT  EDT by telephone and verified that I am speaking with the correct person using two identifiers.   Consent I discussed the limitations, risks, security and privacy concerns of performing an evaluation and management service by telephone and the availability of in person appointments. I also discussed with the patient that there may be a patient responsible charge related to this service. The patient expressed understanding and agreed to proceed.   Location of Patient: Private  Residence    Location of Provider: [place] [facility] [employer] Office    Persons participating in Telemedicine visit: Mallory Rankins FNP-BC [place]    History of Present Illness: Telemedicine visit for: Follow UP.  has a past medical history of Allergy, Back pain, Diabetes mellitus without complication ([place]), Gallstones, Headache(784.0), Hyperlipidemia, and Vaginal Pap smear, abnormal.   DM TYPE 2 She is not consistently diet compliant. Monitoring her blood glucose levels sporadically. Mostly in the am. Average readings: 80-196. Taking metformin 500 mg daily. She denies any symptoms of hypo or hyperglycemia. Taking a statin. Will prescribe low dose ACE today.  Lab Results  Component Value Date   HGBA1C 6.7 (A) 11/26/2018    Dyslipidemia LDL not at goal <70. She denies any statin intolerance or  myalgias. Currently prescribed crestor 5 mg and lovaza 1g BID.  Lab Results  Component Value Date   LDLCALC 141 (H) 12/28/2018     Past Medical History:  Diagnosis Date  . Allergy   . Back pain   . Diabetes mellitus without complication ([place])   . Gallstones   . Headache(784.0)   . Hyperlipidemia   . Vaginal Pap smear, abnormal     Past Surgical History:  Procedure Laterality Date  . CHOLECYSTECTOMY N/A 01/13/2018   Procedure: LAPAROSCOPIC CHOLECYSTECTOMY;  Surgeon: [name], MD;  Location: [facility];  Service: General;  Laterality: N/A;  . NO PAST SURGERIES      Family History  Problem Relation Age of Onset  . CAD Mother   . Hypertension Mother   . Diabetes type II Sister   . Lung disease Sister   . Colon cancer Neg Hx   . Esophageal cancer Neg Hx   . Pancreatic cancer Neg Hx   . Rectal cancer Neg Hx   . Stomach cancer Neg Hx     Social History   Socioeconomic History  . Marital status: Single    Spouse name: Not on file  . Number of children: Not on file  . Years of education: Not on file  . Highest education level: Not on file  Occupational History  . Not on file  Social Needs  . Financial resource strain: Not on file  . Food insecurity    Worry: Not on file    Inability: Not on file  . Transportation needs    Medical: Not on file    Non-medical: Not on file  Tobacco Use  .  Smoking status: Current Every Day Smoker    Packs/day: 1.00    Types: Cigarettes  . Smokeless tobacco: Never Used  Substance and Sexual Activity  . Alcohol use: No  . Drug use: No  . Sexual activity: Yes    Birth control/protection: Post-menopausal  Lifestyle  . Physical activity    Days per week: Not on file    Minutes per session: Not on file  . Stress: Not on file  Relationships  . Social Herbalist on phone: Not on file    Gets together: Not on file    Attends religious service: Not on file    Active member of club or organization: Not  on file    Attends meetings of clubs or organizations: Not on file    Relationship status: Not on file  Other Topics Concern  . 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  HENT: Negative.  Negative for nosebleeds.   Eyes: Negative.  Negative for blurred vision, double vision and photophobia.  Respiratory: Negative.  Negative for cough and shortness of breath.   Cardiovascular: Negative.  Negative for chest pain, palpitations and leg swelling.  Gastrointestinal: Negative.  Negative for heartburn, nausea and vomiting.  Musculoskeletal: Negative.  Negative for myalgias.  Neurological: Negative.  Negative for dizziness, focal weakness, seizures and headaches.  Psychiatric/Behavioral: Negative.  Negative for suicidal ideas.    Assessment and Plan: Mallory Wall was seen today for follow-up and vaginal discharge.  Diagnoses and all orders for this visit:  Controlled type 2 diabetes mellitus without complication, without long-term current use of insulin (HCC) -     metFORMIN (GLUCOPHAGE) 500 MG tablet; Take 1 tablet (500 mg total) by mouth daily with breakfast. Patient will pick up scripts tomorrow 05-03-2019 -     lisinopril (ZESTRIL) 10 MG tablet; Take 1 tablet (10 mg total) by mouth daily. Continue blood sugar control as discussed in office today, low carbohydrate diet, and regular physical exercise as tolerated, 150 minutes per week (30 min each day, 5 days per week, or 50 min 3 days per week). Keep blood sugar logs with fasting goal of 90-130 mg/dl, post prandial (after you eat) less than 180.  For Hypoglycemia: BS <60 and Hyperglycemia BS >400; contact the clinic ASAP. Annual eye exams and foot exams are recommended.   Statin intolerance -     omega-3 acid ethyl esters (LOVAZA) 1 g capsule; Take 1 capsule (1 g total)  by mouth 2 (two) times daily. Patient will pick up scripts tomorrow 05-03-2019  Dyslipidemia, goal LDL below 70 -     rosuvastatin (CRESTOR) 5 MG tablet; Take 1 tablet (5 mg total) by mouth daily. Patient will pick up scripts tomorrow 05-03-2019 -     omega-3 acid ethyl esters (LOVAZA) 1 g capsule; Take 1 capsule (1 g total) by mouth 2 (two) times daily. Patient will pick up scripts tomorrow 05-03-2019 INSTRUCTIONS: Work on a low fat, heart healthy diet and participate in regular aerobic exercise program by working out at least 150 minutes per week; 5 days a week-30 minutes per day. Avoid red meat, fried foods. junk foods, sodas, sugary drinks, unhealthy snacking, alcohol and smoking.  Drink at least 48oz of water per day and monitor your carbohydrate intake daily.      Follow Up Instructions  Return in about 6 weeks (around 06/13/2019).     I discussed the assessment and treatment plan with the patient. The patient was provided an opportunity to ask questions and all were answered. The patient agreed with the plan and demonstrated an understanding of the instructions.   The patient was advised to call back or seek an in-person evaluation if the symptoms worsen or if the condition fails to improve as anticipated.  I provided 24 minutes of non-face-to-face time during this encounter including median intraservice time, reviewing previous notes, labs, imaging, medications and explaining diagnosis and management.  [name], FNP-BC

## 2019-05-03 ENCOUNTER — Encounter: Payer: Self-pay | Admitting: Nurse Practitioner

## 2019-05-03 ENCOUNTER — Other Ambulatory Visit: Payer: Self-pay | Admitting: Nurse Practitioner

## 2019-05-03 ENCOUNTER — Ambulatory Visit: Payer: Self-pay | Attending: Nurse Practitioner

## 2019-05-03 ENCOUNTER — Other Ambulatory Visit (HOSPITAL_COMMUNITY)
Admission: RE | Admit: 2019-05-03 | Discharge: 2019-05-03 | Disposition: A | Discharge status: Home / Self Care | Payer: Medicaid Other | Source: Ambulatory Visit | Attending: Nurse Practitioner | Admitting: Nurse Practitioner

## 2019-05-03 DIAGNOSIS — E119 Type 2 diabetes mellitus without complications: Secondary | ICD-10-CM

## 2019-05-03 DIAGNOSIS — N898 Other specified noninflammatory disorders of vagina: Secondary | ICD-10-CM

## 2019-05-03 DIAGNOSIS — E785 Hyperlipidemia, unspecified: Secondary | ICD-10-CM

## 2019-05-03 LAB — POCT URINALYSIS DIP (CLINITEK)
Bilirubin, UA: NEGATIVE
Blood, UA: NEGATIVE
Glucose, UA: NEGATIVE mg/dL
Ketones, POC UA: NEGATIVE mg/dL
Leukocytes, UA: NEGATIVE
Nitrite, UA: NEGATIVE
POC PROTEIN,UA: NEGATIVE
Spec Grav, UA: 1.02 (ref 1.010–1.025)
Urobilinogen, UA: 1 E.U./dL
pH, UA: 5.5 (ref 5.0–8.0)

## 2019-05-03 MED ORDER — LISINOPRIL 10 MG PO TABS: 10.0000 mg | ORAL_TABLET | Freq: Every day | ORAL | 3 refills | Status: DC

## 2019-05-03 MED FILL — LISINOPRIL 10 MG TABS: 10 | 30 days supply | Qty: 30 | Fill #0

## 2019-05-04 LAB — CERVICOVAGINAL ANCILLARY ONLY
Bacterial vaginitis: POSITIVE — AB
Candida vaginitis: NEGATIVE
Chlamydia: NEGATIVE
Neisseria Gonorrhea: NEGATIVE
Trichomonas: NEGATIVE

## 2019-05-04 LAB — BASIC METABOLIC PANEL
BUN/Creatinine Ratio: 20 (ref 9–23)
BUN: 12 mg/dL (ref 6–24)
CO2: 25 mmol/L (ref 20–29)
Calcium: 9.3 mg/dL (ref 8.7–10.2)
Chloride: 100 mmol/L (ref 96–106)
Creatinine, Ser: 0.59 mg/dL (ref 0.57–1.00)
GFR calc Af Amer: 118 mL/min/{1.73_m2} (ref >59)
GFR calc non Af Amer: 102 mL/min/{1.73_m2} (ref >59)
Glucose: 123 mg/dL — ABNORMAL HIGH (ref 65–99)
Potassium: 4.3 mmol/L (ref 3.5–5.2)
Sodium: 140 mmol/L (ref 134–144)

## 2019-05-04 LAB — HEMOGLOBIN A1C
Est. average glucose Bld gHb Est-mCnc: 146 mg/dL
Hgb A1c MFr Bld: 6.7 % — ABNORMAL HIGH (ref 4.8–5.6)

## 2019-05-04 LAB — LIPID PANEL
Chol/HDL Ratio: 2.9 ratio (ref 0.0–4.4)
Cholesterol, Total: 120 mg/dL (ref 100–199)
HDL: 42 mg/dL (ref >39)
LDL Calculated: 66 mg/dL (ref 0–99)
Triglycerides: 59 mg/dL (ref 0–149)
VLDL Cholesterol Cal: 12 mg/dL (ref 5–40)

## 2019-05-05 ENCOUNTER — Other Ambulatory Visit: Payer: Self-pay | Admitting: Nurse Practitioner

## 2019-05-05 MED ORDER — METRONIDAZOLE 500 MG PO TABS: 500.0000 mg | ORAL_TABLET | Freq: Two times a day (BID) | ORAL | 0 refills | Status: AC

## 2019-05-05 MED FILL — TRUE METRIX TEST STRIP: 30 days supply | Qty: 100 | Fill #2

## 2019-05-05 NOTE — Progress Notes (Signed)
CMA spoke to patient informing on lab results and medication was sent by PCP.   Pt. Understood.

## 2019-05-10 NOTE — Progress Notes (Signed)
CMA spoke to patient.  Patient was inform on lab results and PCP advising. Pt .verified DOB.  Pt. Understood.

## 2019-05-30 MED FILL — ?ROSUVASTATIN CALCIUM 5MG T: 5 | 30 days supply | Qty: 30 | Fill #1

## 2019-05-30 MED FILL — LISINOPRIL 10 MG TABS: 10 | 30 days supply | Qty: 30 | Fill #1

## 2019-05-30 MED FILL — TRUE METRIX TEST STRIP: 30 days supply | Qty: 100 | Fill #3

## 2019-05-30 MED FILL — ?METFORMIN HCL 500MG TABLET: 500 | 30 days supply | Qty: 30 | Fill #1

## 2019-06-13 ENCOUNTER — Encounter: Payer: Self-pay | Admitting: Nurse Practitioner

## 2019-06-13 ENCOUNTER — Ambulatory Visit: Payer: Self-pay | Attending: Nurse Practitioner | Admitting: Nurse Practitioner

## 2019-06-13 ENCOUNTER — Other Ambulatory Visit: Payer: Self-pay

## 2019-06-13 DIAGNOSIS — E785 Hyperlipidemia, unspecified: Secondary | ICD-10-CM

## 2019-06-13 DIAGNOSIS — E119 Type 2 diabetes mellitus without complications: Secondary | ICD-10-CM

## 2019-06-13 DIAGNOSIS — I10 Essential (primary) hypertension: Secondary | ICD-10-CM

## 2019-06-13 DIAGNOSIS — K219 Gastro-esophageal reflux disease without esophagitis: Secondary | ICD-10-CM

## 2019-06-13 MED ORDER — GLUCOSE BLOOD VI STRP: ORAL_STRIP | 12 refills | Status: DC

## 2019-06-13 MED ORDER — FAMOTIDINE 20 MG PO TABS: 20.0000 mg | ORAL_TABLET | Freq: Every day | ORAL | 2 refills | Status: DC

## 2019-06-13 MED FILL — TRUE METRIX TEST STRIP: 50 days supply | Qty: 100 | Fill #0

## 2019-06-13 NOTE — Progress Notes (Signed)
Virtual Visit via Telephone Note Due to national recommendations of social distancing due to [place] 19, telehealth visit is felt to be most appropriate for this patient at this time.  I discussed the limitations, risks, security and privacy concerns of performing an evaluation and management service by telephone and the availability of in person appointments. I also discussed with the patient that there may be a patient responsible charge related to this service. The patient expressed understanding and agreed to proceed.    I connected with Elise Benne on 06/13/19  at   1:30 PM EDT  EDT by telephone and verified that I am speaking with the correct person using two identifiers.   Consent I discussed the limitations, risks, security and privacy concerns of performing an evaluation and management service by telephone and the availability of in person appointments. I also discussed with the patient that there may be a patient responsible charge related to this service. The patient expressed understanding and agreed to proceed.   Location of Patient: Private  residence   Location of Provider: [place] and [place] participating in Telemedicine visit: [name] FNP-BC [place]    History of Present Illness: Telemedicine visit for: Follow up  has a past medical history of Allergy, Back pain, Diabetes mellitus without complication ([place]), Gallstones, Headache(784.0), Hyperlipidemia, and Vaginal Pap smear, abnormal.   DM TYPE 2 Well controlled. Fasting average 90-120s. Monitoring blood glucose levels twice per day. Taking metformin 500 mg daily as prescribed. On ACE and statin. Denies any hypo or hyperglycemic symptoms.  Lab Results  Component Value Date   HGBA1C 6.7 (H) 05/03/2019   Lab Results  Component Value Date   LDLCALC 66 05/03/2019   BP Readings from Last 3 Encounters:  12/28/18 133/86  11/26/18 136/84  09/22/18 139/88     GERD: Patient complains of heartburn. This has been associated with burning sensation in stomach.  She denies deep pressure at base of neck, difficulty swallowing, hematemesis, melena and regurgitation of undigested food. Symptoms have been present for 1 month. She denies dysphagia.  She has not lost weight. She denies melena, hematochezia, hematemesis, and coffee ground emesis. Medical therapy in the past has included antacids (TUMS).   Past Medical History:  Diagnosis Date  . Allergy   . Back pain   . Diabetes mellitus without complication ([place])   . Gallstones   . Headache(784.0)   . Hyperlipidemia   . Vaginal Pap smear, abnormal     Past Surgical History:  Procedure Laterality Date  . CHOLECYSTECTOMY N/A 01/13/2018   Procedure: LAPAROSCOPIC CHOLECYSTECTOMY;  Surgeon: [name], MD;  Location: [facility];  Service: General;  Laterality: N/A;  . NO PAST SURGERIES      Family History  Problem Relation Age of Onset  . CAD Mother   . Hypertension Mother   . Diabetes type II Sister   . Lung disease Sister   . Colon cancer Neg Hx   . Esophageal cancer Neg Hx   . Pancreatic cancer Neg Hx   . Rectal cancer Neg Hx   . Stomach cancer Neg Hx     Social History   Socioeconomic History  . Marital status: Single    Spouse name: Not on file  . Number of children: Not on file  . Years of education: Not on file  . Highest education level: Not on file  Occupational History  . Not on file  Social Needs  .  Financial resource strain: Not on file  . Food insecurity    Worry: Not on file    Inability: Not on file  . Transportation needs    Medical: Not on file    Non-medical: Not on file  Tobacco Use  . Smoking status: Current Every Day Smoker    Packs/day: 1.00    Types: Cigarettes  . Smokeless tobacco: Never Used  Substance and Sexual Activity  . Alcohol use: No  . Drug use: No  . Sexual activity: Yes    Birth control/protection: Post-menopausal   Lifestyle  . Physical activity    Days per week: Not on file    Minutes per session: Not on file  . Stress: Not on file  Relationships  . Social Herbalist on phone: Not on file    Gets together: Not on file    Attends religious service: Not on file    Active member of club or organization: Not on file    Attends meetings of clubs or organizations: Not on file    Relationship status: Not on file  Other Topics Concern  . 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  HENT: Negative.  Negative for nosebleeds.   Eyes: Negative.  Negative for blurred vision, double vision and photophobia.  Respiratory: Negative.  Negative for cough and shortness of breath.   Cardiovascular: Negative.  Negative for chest pain, palpitations and leg swelling.  Gastrointestinal: Negative.  Negative for heartburn, nausea and vomiting.  Musculoskeletal: Negative.  Negative for myalgias.  Neurological: Positive for headaches (will f/u at next office visit. She has upcoming appt with eye doc.  States she never picked up prescription for new glasses last year so headaches may be related to this. ). Negative for dizziness, focal weakness and seizures.  Psychiatric/Behavioral: Negative.  Negative for suicidal ideas.    Assessment and Plan:  Diagnoses and all orders for this visit:  Controlled type 2 diabetes mellitus without complication, without long-term current use of insulin (HCC) -     glucose blood test strip; Use as instructed. Monitor blood glucose levels twice per day Continue blood sugar control as discussed in office today, low carbohydrate diet, and regular physical exercise as tolerated, 150 minutes per week (30 min each day, 5 days per week, or 50 min 3 days per week). Keep blood sugar logs with fasting goal of  90-130 mg/dl, post prandial (after you eat) less than 180.  For Hypoglycemia: BS <60 and Hyperglycemia BS >400; contact the clinic ASAP. Annual eye exams and foot exams are recommended.   Gastroesophageal reflux disease, esophagitis presence not specified -     famotidine (PEPCID) 20 MG tablet; Take 1 tablet (20 mg total) by mouth daily.  INSTRUCTIONS: Avoid GERD Triggers: acidic, spicy or fried foods, caffeine, coffee, sodas,  alcohol and chocolate.   Essential hypertension Continue all antihypertensives as prescribed.  Remember to bring in your blood pressure log with you for your follow up appointment.  DASH/Mediterranean Diets are healthier choices for HTN.    Dyslipidemia, goal LDL below 70 INSTRUCTIONS: Work on a low fat, heart healthy diet and participate in regular aerobic exercise program by working out at least 150 minutes per week; 5 days a week-30 minutes per day. Avoid red meat, fried foods. junk foods, sodas, sugary  drinks, unhealthy snacking, alcohol and smoking.  Drink at least 48oz of water per day and monitor your carbohydrate intake daily.      Follow Up Instructions Return in about 2 months (around 08/13/2019).     I discussed the assessment and treatment plan with the patient. The patient was provided an opportunity to ask questions and all were answered. The patient agreed with the plan and demonstrated an understanding of the instructions.   The patient was advised to call back or seek an in-person evaluation if the symptoms worsen or if the condition fails to improve as anticipated.  I provided 24 minutes of non-face-to-face time during this encounter including median intraservice time, reviewing previous notes, labs, imaging, medications and explaining diagnosis and management.  [name], FNP-BC

## 2019-06-27 MED FILL — TRUE METRIX TEST STRIP: 30 days supply | Qty: 100 | Fill #4

## 2019-06-27 MED FILL — OMEGA-3 ETHYL ESTERS 1 GM C: 1 | 30 days supply | Qty: 60 | Fill #1

## 2019-06-27 MED FILL — ?METFORMIN HCL 500MG TABLET: 500 | 30 days supply | Qty: 30 | Fill #2

## 2019-06-27 MED FILL — LISINOPRIL 10 MG TABS: 10 | 30 days supply | Qty: 30 | Fill #2

## 2019-06-27 MED FILL — ?ROSUVASTATIN CALCIUM 5MG T: 5 | 30 days supply | Qty: 30 | Fill #2

## 2019-06-28 ENCOUNTER — Other Ambulatory Visit: Payer: Self-pay | Admitting: Nurse Practitioner

## 2019-06-28 DIAGNOSIS — E119 Type 2 diabetes mellitus without complications: Secondary | ICD-10-CM

## 2019-07-26 MED FILL — LISINOPRIL 10 MG TABS: 10 | 30 days supply | Qty: 30 | Fill #3

## 2019-07-26 MED FILL — ?ROSUVASTATIN CALCIUM 5MG T: 5 | 30 days supply | Qty: 30 | Fill #3

## 2019-07-26 MED FILL — OMEGA-3 ETHYL ESTERS 1 GM C: 1 | 30 days supply | Qty: 60 | Fill #2

## 2019-07-26 MED FILL — ?METFORMIN HCL 500MG TABLET: 500 | 30 days supply | Qty: 30 | Fill #3

## 2019-07-26 MED FILL — TRUE METRIX TEST STRIP: 30 days supply | Qty: 100 | Fill #5

## 2019-08-08 ENCOUNTER — Ambulatory Visit: Payer: Self-pay | Attending: Nurse Practitioner | Admitting: Nurse Practitioner

## 2019-08-08 ENCOUNTER — Other Ambulatory Visit: Payer: Self-pay

## 2019-08-08 ENCOUNTER — Encounter: Payer: Self-pay | Admitting: Nurse Practitioner

## 2019-08-08 VITALS — BP 126/88 | HR 78 | Temp 98.6°F | Ht 62.0 in | Wt 153.0 lb

## 2019-08-08 DIAGNOSIS — I10 Essential (primary) hypertension: Secondary | ICD-10-CM

## 2019-08-08 DIAGNOSIS — E119 Type 2 diabetes mellitus without complications: Secondary | ICD-10-CM

## 2019-08-08 LAB — GLUCOSE, POCT (MANUAL RESULT ENTRY): POC Glucose: 132 mg/dl — AB (ref 70–99)

## 2019-08-08 NOTE — Progress Notes (Signed)
Assessment & Plan:  Mallory Wall was seen today for follow-up.  Diagnoses and all orders for this visit:  Essential hypertension -     Basic metabolic panel Continue all antihypertensives as prescribed.  Remember to bring in your blood pressure log with you for your follow up appointment.  DASH/Mediterranean Diets are healthier choices for HTN.    Controlled type 2 diabetes mellitus without complication, without long-term current use of insulin (HCC) -     Glucose (CBG) Continue blood sugar control as discussed in office today, low carbohydrate diet, and regular physical exercise as tolerated, 150 minutes per week (30 min each day, 5 days per week, or 50 min 3 days per week). Keep blood sugar logs with fasting goal of 90-130 mg/dl, post prandial (after you eat) less than 180.  For Hypoglycemia: BS <60 and Hyperglycemia BS >400; contact the clinic ASAP. Annual eye exams and foot exams are recommended.    Patient has been counseled on age-appropriate routine health concerns for screening and prevention. These are reviewed and up-to-date. Referrals have been placed accordingly. Immunizations are up-to-date or declined.    Subjective:   Chief Complaint  Patient presents with  . Follow-up    Pt. is here for HTN and diabetes follow up.    HPI Mallory Wall 58 y.o. female presents to office today for HTN.  has a past medical history of Allergy, Back pain, Diabetes mellitus without complication ([place]), Gallstones, Headache(784.0), Hyperlipidemia, and Vaginal Pap smear, abnormal.   ESSENTIAL HYPERTENSION Currently taking lisinopril 10 mg daily. Blood pressure is well controlled. Denies chest pain, shortness of breath, palpitations, lightheadedness, dizziness, headaches or BLE edema. She does monitor her blood pressure at home infrequently. She has lost 10lbs since her last office visit.  BP Readings from Last 3 Encounters:  08/08/19 126/88  12/28/18 133/86  11/26/18 136/84   DM TYPE 2  Monitoring her blood glucose levels 1-2 times per day. Average readings: 80-130s. Denies any hyperglycemic symptoms. Endorses a few low readings in the 60s. Taking metformin 500 mg daily as prescribed.  Lab Results  Component Value Date   HGBA1C 6.7 (H) 05/03/2019     Review of Systems  Constitutional: Negative for fever, malaise/fatigue and weight loss.  HENT: Negative.  Negative for nosebleeds.   Eyes: Negative.  Negative for blurred vision, double vision and photophobia.  Respiratory: Negative.  Negative for cough and shortness of breath.   Cardiovascular: Negative.  Negative for chest pain, palpitations and leg swelling.  Gastrointestinal: Negative.  Negative for heartburn, nausea and vomiting.  Musculoskeletal: Negative.  Negative for myalgias.  Neurological: Negative.  Negative for dizziness, focal weakness, seizures and headaches.  Psychiatric/Behavioral: Negative.  Negative for suicidal ideas.    Past Medical History:  Diagnosis Date  . Allergy   . Back pain   . Diabetes mellitus without complication ([place])   . Gallstones   . Headache(784.0)   . Hyperlipidemia   . Vaginal Pap smear, abnormal     Past Surgical History:  Procedure Laterality Date  . CHOLECYSTECTOMY N/A 01/13/2018   Procedure: LAPAROSCOPIC CHOLECYSTECTOMY;  Surgeon: [name], MD;  Location: [facility];  Service: General;  Laterality: N/A;  . NO PAST SURGERIES      Family History  Problem Relation Age of Onset  . CAD Mother   . Hypertension Mother   . Diabetes type II Sister   . Lung disease Sister   . Colon cancer Neg Hx   . Esophageal cancer Neg Hx   .  Pancreatic cancer Neg Hx   . Rectal cancer Neg Hx   . Stomach cancer Neg Hx     Social History Reviewed with no changes to be made today.   Outpatient Medications Prior to Visit  Medication Sig Dispense Refill  . acetaminophen (TYLENOL) 325 MG tablet Take 650 mg by mouth every 6 (six) hours as needed.    . Blood  Glucose Monitoring Suppl (TRUE METRIX METER) w/Device KIT Use as Direct. 1 kit 0  . Lancet Devices (MICROLET NEXT LANCING DEVICE) MISC 1 each by Does not apply route daily before breakfast. 1 each 0  . lisinopril (ZESTRIL) 10 MG tablet Take 1 tablet (10 mg total) by mouth daily. 90 tablet 3  . rosuvastatin (CRESTOR) 5 MG tablet Take 1 tablet (5 mg total) by mouth daily. Patient will pick up scripts tomorrow 05-03-2019 90 tablet 3  . TRUEplus Lancets 28G MISC Use as Direct. 100 each 12  . Blood Glucose Monitoring Suppl (CONTOUR NEXT MONITOR) w/Device KIT 1 each by Does not apply route daily before breakfast. (Patient not taking: Reported on 05/02/2019) 1 kit 0  . CONTOUR NEXT TEST test strip USE AS INSTRUCTED (Patient not taking: Reported on 08/08/2019) 100 strip 3  . famotidine (PEPCID) 20 MG tablet Take 1 tablet (20 mg total) by mouth daily. 30 tablet 2  . metFORMIN (GLUCOPHAGE) 500 MG tablet Take 1 tablet (500 mg total) by mouth daily with breakfast. Patient will pick up scripts tomorrow 05-03-2019 30 tablet 3  . Microlet Lancets MISC USE AS DIRECTED DAILY BEFORE BREAKFAST (Patient not taking: Reported on 05/02/2019) 100 each 1  . omega-3 acid ethyl esters (LOVAZA) 1 g capsule Take 1 capsule (1 g total) by mouth 2 (two) times daily. Patient will pick up scripts tomorrow 05-03-2019 180 capsule 3  . ondansetron (ZOFRAN) 4 MG tablet Take 1 tablet (4 mg total) by mouth every 8 (eight) hours as needed for nausea or vomiting. (Patient not taking: Reported on 06/13/2019) 20 tablet 0   No facility-administered medications prior to visit.     No Known Allergies     Objective:    BP 126/88 (BP Location: Right Arm, Patient Position: Sitting, Cuff Size: Normal)   Pulse 78   Temp 98.6 F (37 C) (Oral)   Ht _0  (1.575 m)   Wt 153 lb (69.4 kg)   SpO2 99%   BMI 27.98 kg/m  Wt Readings from Last 3 Encounters:  08/08/19 153 lb (69.4 kg)  12/28/18 163 lb 6.4 oz (74.1 kg)  11/26/18 157 lb 6.4 oz (71.4 kg)     Physical Exam Vitals signs and nursing note reviewed.  Constitutional:      Appearance: She is well-developed.  HENT:     Head: Normocephalic and atraumatic.  Neck:     Musculoskeletal: Normal range of motion.  Cardiovascular:     Rate and Rhythm: Normal rate and regular rhythm.     Heart sounds: Normal heart sounds. No murmur. No friction rub. No gallop.   Pulmonary:     Effort: Pulmonary effort is normal. No tachypnea or respiratory distress.     Breath sounds: Normal breath sounds. No decreased breath sounds, wheezing, rhonchi or rales.  Chest:     Chest wall: No tenderness.  Abdominal:     General: Bowel sounds are normal.     Palpations: Abdomen is soft.  Musculoskeletal: Normal range of motion.  Skin:    General: Skin is warm and dry.  Neurological:  Mental Status: She is alert and oriented to person, place, and time.     Coordination: Coordination normal.  Psychiatric:        Behavior: Behavior normal. Behavior is cooperative.        Thought Content: Thought content normal.        Judgment: Judgment normal.          Patient has been counseled extensively about nutrition and exercise as well as the importance of adherence with medications and regular follow-up. The patient was given clear instructions to go to ER or return to medical center if symptoms don't improve, worsen or new problems develop. The patient verbalized understanding.   Follow-up: Return in about 3 months (around 11/08/2019) for HTN, DM, HPL.   [name], FNP-BC [facility] [place], [place]   08/08/2019, 11:09 AM

## 2019-08-09 LAB — BASIC METABOLIC PANEL
BUN/Creatinine Ratio: 18 (ref 9–23)
BUN: 11 mg/dL (ref 6–24)
CO2: 21 mmol/L (ref 20–29)
Calcium: 9.7 mg/dL (ref 8.7–10.2)
Chloride: 103 mmol/L (ref 96–106)
Creatinine, Ser: 0.6 mg/dL (ref 0.57–1.00)
GFR calc Af Amer: 117 mL/min/{1.73_m2} (ref >59)
GFR calc non Af Amer: 102 mL/min/{1.73_m2} (ref >59)
Glucose: 117 mg/dL — ABNORMAL HIGH (ref 65–99)
Potassium: 4.3 mmol/L (ref 3.5–5.2)
Sodium: 142 mmol/L (ref 134–144)

## 2019-08-12 ENCOUNTER — Telehealth (HOSPITAL_COMMUNITY): Payer: Self-pay

## 2019-08-12 NOTE — Telephone Encounter (Signed)
Telephoned patient at home number. Left message for patient to call and schedule with [facility]. °

## 2019-08-26 MED FILL — OMEGA-3 ETHYL ESTERS 1 GM C: 1 | 30 days supply | Qty: 60 | Fill #3

## 2019-08-26 MED FILL — TRUE METRIX TEST STRIP: 30 days supply | Qty: 100 | Fill #6

## 2019-08-31 ENCOUNTER — Other Ambulatory Visit (HOSPITAL_COMMUNITY): Payer: Self-pay | Admitting: *Deleted

## 2019-08-31 DIAGNOSIS — Z1231 Encounter for screening mammogram for malignant neoplasm of breast: Secondary | ICD-10-CM

## 2019-09-01 MED FILL — LISINOPRIL 10 MG TABS: 10 | 30 days supply | Qty: 30 | Fill #4

## 2019-09-01 MED FILL — ?ROSUVASTATIN CALCIUM 5MG T: 5 | 30 days supply | Qty: 30 | Fill #4

## 2019-09-01 MED FILL — ?METFORMIN HCL 500MG TABLET: 500 | 30 days supply | Qty: 30 | Fill #2

## 2019-09-26 ENCOUNTER — Other Ambulatory Visit: Payer: Self-pay | Admitting: Nurse Practitioner

## 2019-09-26 DIAGNOSIS — Z789 Other specified health status: Secondary | ICD-10-CM

## 2019-09-26 DIAGNOSIS — E785 Hyperlipidemia, unspecified: Secondary | ICD-10-CM

## 2019-09-26 MED FILL — LISINOPRIL 10 MG TABS: 10 | 30 days supply | Qty: 30 | Fill #5

## 2019-09-26 MED FILL — ?ROSUVASTATIN CALCIUM 5MG T: 5 | 30 days supply | Qty: 30 | Fill #5

## 2019-09-26 MED FILL — OMEGA-3 ETHYL ESTERS 1 GM C: 1 | 30 days supply | Qty: 60 | Fill #4

## 2019-09-26 MED FILL — TRUE METRIX TEST STRIP: 30 days supply | Qty: 100 | Fill #7

## 2019-09-26 MED FILL — ?METFORMIN HCL 500MG TABLET: 500 | 30 days supply | Qty: 30 | Fill #3

## 2019-10-03 ENCOUNTER — Ambulatory Visit: Payer: Medicaid Other

## 2019-10-12 ENCOUNTER — Ambulatory Visit: Payer: Medicaid Other

## 2019-10-26 MED FILL — OMEGA-3 ETHYL ESTERS 1 GM C: 1 | 30 days supply | Qty: 60 | Fill #5

## 2019-11-01 ENCOUNTER — Ambulatory Visit (HOSPITAL_COMMUNITY): Payer: Medicaid Other

## 2019-11-02 ENCOUNTER — Other Ambulatory Visit: Payer: Self-pay | Admitting: Nurse Practitioner

## 2019-11-02 DIAGNOSIS — E119 Type 2 diabetes mellitus without complications: Secondary | ICD-10-CM

## 2019-11-02 MED FILL — ?ROSUVASTATIN CALCIUM 5MG T: 5 | 30 days supply | Qty: 30 | Fill #6

## 2019-11-02 MED FILL — LISINOPRIL 10 MG TABS: 10 | 30 days supply | Qty: 30 | Fill #6

## 2019-11-02 MED FILL — metFORMIN HCL 500 MG TABS: 500 | 30 days supply | Qty: 30 | Fill #0

## 2019-11-09 ENCOUNTER — Ambulatory Visit: Payer: Medicaid Other | Admitting: Nurse Practitioner

## 2019-11-11 ENCOUNTER — Encounter: Payer: Self-pay | Admitting: Nurse Practitioner

## 2019-11-11 ENCOUNTER — Ambulatory Visit: Payer: Self-pay | Attending: Nurse Practitioner | Admitting: Nurse Practitioner

## 2019-11-11 VITALS — BP 119/83 | HR 86 | Temp 97.5°F | Ht 62.0 in | Wt 155.0 lb

## 2019-11-11 DIAGNOSIS — I10 Essential (primary) hypertension: Secondary | ICD-10-CM

## 2019-11-11 DIAGNOSIS — E119 Type 2 diabetes mellitus without complications: Secondary | ICD-10-CM

## 2019-11-11 DIAGNOSIS — E785 Hyperlipidemia, unspecified: Secondary | ICD-10-CM

## 2019-11-11 DIAGNOSIS — K219 Gastro-esophageal reflux disease without esophagitis: Secondary | ICD-10-CM

## 2019-11-11 LAB — GLUCOSE, POCT (MANUAL RESULT ENTRY): POC Glucose: 101 mg/dl — AB (ref 70–99)

## 2019-11-11 LAB — POCT GLYCOSYLATED HEMOGLOBIN (HGB A1C): Hemoglobin A1C: 6.7 % — AB (ref 4.0–5.6)

## 2019-11-11 MED ORDER — FAMOTIDINE 20 MG PO TABS: 20.0000 mg | ORAL_TABLET | Freq: Every day | ORAL | 2 refills | Status: DC

## 2019-11-11 MED FILL — FAMOTIDINE 20 MG TABS: 20 | 30 days supply | Qty: 30 | Fill #0

## 2019-11-11 NOTE — Progress Notes (Signed)
Assessment & Plan:  Mallory Wall was seen today for follow-up.  Diagnoses and all orders for this visit:  Controlled type 2 diabetes mellitus without complication, without long-term current use of insulin (HCC) -     Glucose (CBG) -     HgB A1c Continue blood sugar control as discussed in office today, low carbohydrate diet, and regular physical exercise as tolerated, 150 minutes per week (30 min each day, 5 days per week, or 50 min 3 days per week). Keep blood sugar logs with fasting goal of 90-130 mg/dl, post prandial (after you eat) less than 180.  For Hypoglycemia: BS <60 and Hyperglycemia BS >400; contact the clinic ASAP. Annual eye exams and foot exams are recommended.  GERD without esophagitis -     famotidine (PEPCID) 20 MG tablet; Take 1 tablet (20 mg total) by mouth daily. INSTRUCTIONS: Avoid GERD Triggers: acidic, spicy or fried foods, caffeine, coffee, sodas,  alcohol and chocolate.   Essential hypertension Continue all antihypertensives as prescribed.  Remember to bring in your blood pressure log with you for your follow up appointment.  DASH/Mediterranean Diets are healthier choices for HTN.   Dyslipidemia, goal LDL below 70 INSTRUCTIONS: Work on a low fat, heart healthy diet and participate in regular aerobic exercise program by working out at least 150 minutes per week; 5 days a week-30 minutes per day. Avoid red meat/beef/steak,  fried foods. junk foods, sodas, sugary drinks, unhealthy snacking, alcohol and smoking.  Drink at least 80 oz of water per day and monitor your carbohydrate intake daily.     Patient has been counseled on age-appropriate routine health concerns for screening and prevention. These are reviewed and up-to-date. Referrals have been placed accordingly. Immunizations are up-to-date or declined.    Subjective:   Chief Complaint  Patient presents with  . Follow-up    Pt is here for a follow up.    HPI [name] Proano 59 y.o. female presents to  office today for follow up.  has a past medical history of Allergy, Back pain, Diabetes mellitus without complication ([place]), Gallstones, Headache(784.0), Hyperlipidemia, and Vaginal Pap smear, abnormal.   HEALTH MAINTENANCE Breast Exam scheduled on 11-28-2018 Last PAP 2019 Colonoscopy 2017   DM TYPE 2  Monitoring her blood glucose levels twice a day. Fasting: 100 and postprandial: 130s. Denies any hypo or hyperglycemic symptoms. Taking metformin 500 mg daily as prescribed. On STATIN and ACE. Denies any hypo or hyperglycemic symptoms. UTD with eye exam.  Lab Results  Component Value Date   HGBA1C 6.7 (A) 11/11/2019    Essential Hypertension Taking lisinopril 10 mg daily as prescribed. Blood pressure well controlled. Denies chest pain, shortness of breath, palpitations, lightheadedness, dizziness, headaches or BLE edema. Weight stable.  BP Readings from Last 3 Encounters:  11/11/19 119/83  08/08/19 126/88  12/28/18 133/86   Mixed Hyperlipidemia LDL at goal of <70. Taking crestor 5 mg daily as prescribed. Denis any statin intolerance or myalgias.  Lab Results  Component Value Date   LDLCALC 66 05/03/2019   Review of Systems  Constitutional: Negative for fever, malaise/fatigue and weight loss.  HENT: Negative.  Negative for nosebleeds.   Eyes: Negative.  Negative for blurred vision, double vision and photophobia.  Respiratory: Negative.  Negative for cough and shortness of breath.   Cardiovascular: Negative.  Negative for chest pain, palpitations and leg swelling.  Gastrointestinal: Positive for heartburn. Negative for nausea and vomiting.  Musculoskeletal: Negative.  Negative for myalgias.  Neurological: Negative.  Negative for dizziness,  focal weakness, seizures and headaches.  Psychiatric/Behavioral: Negative.  Negative for suicidal ideas.    Past Medical History:  Diagnosis Date  . Allergy   . Back pain   . Diabetes mellitus without complication ([place])   . Gallstones   .  Headache(784.0)   . Hyperlipidemia   . Vaginal Pap smear, abnormal     Past Surgical History:  Procedure Laterality Date  . CHOLECYSTECTOMY N/A 01/13/2018   Procedure: LAPAROSCOPIC CHOLECYSTECTOMY;  Surgeon: [name], MD;  Location: [facility];  Service: General;  Laterality: N/A;  . NO PAST SURGERIES      Family History  Problem Relation Age of Onset  . CAD Mother   . Hypertension Mother   . Diabetes type II Sister   . Lung disease Sister   . Colon cancer Neg Hx   . Esophageal cancer Neg Hx   . Pancreatic cancer Neg Hx   . Rectal cancer Neg Hx   . Stomach cancer Neg Hx     Social History Reviewed with no changes to be made today.   Outpatient Medications Prior to Visit  Medication Sig Dispense Refill  . acetaminophen (TYLENOL) 325 MG tablet Take 650 mg by mouth every 6 (six) hours as needed.    . Blood Glucose Monitoring Suppl (TRUE METRIX METER) w/Device KIT Use as Direct. 1 kit 0  . Lancet Devices (MICROLET NEXT LANCING DEVICE) MISC 1 each by Does not apply route daily before breakfast. 1 each 0  . lisinopril (ZESTRIL) 10 MG tablet Take 1 tablet (10 mg total) by mouth daily. 90 tablet 3  . metFORMIN (GLUCOPHAGE) 500 MG tablet TAKE 1 TABLET (500 MG TOTAL) BY MOUTH DAILY WITH BREAKFAST FOR 30 DAYS. 30 tablet 3  . omega-3 acid ethyl esters (LOVAZA) 1 g capsule TAKE 1 CAPSULE BY MOUTH TWICE DAILY 180 capsule 3  . rosuvastatin (CRESTOR) 5 MG tablet Take 1 tablet (5 mg total) by mouth daily. Patient will pick up scripts tomorrow 05-03-2019 90 tablet 3  . TRUEplus Lancets 28G MISC Use as Direct. 100 each 12  . Blood Glucose Monitoring Suppl (CONTOUR NEXT MONITOR) w/Device KIT 1 each by Does not apply route daily before breakfast. (Patient not taking: Reported on 05/02/2019) 1 kit 0  . CONTOUR NEXT TEST test strip USE AS INSTRUCTED (Patient not taking: Reported on 08/08/2019) 100 strip 3  . Microlet Lancets MISC USE AS DIRECTED DAILY BEFORE BREAKFAST (Patient not  taking: Reported on 05/02/2019) 100 each 1  . ondansetron (ZOFRAN) 4 MG tablet Take 1 tablet (4 mg total) by mouth every 8 (eight) hours as needed for nausea or vomiting. (Patient not taking: Reported on 06/13/2019) 20 tablet 0  . famotidine (PEPCID) 20 MG tablet Take 1 tablet (20 mg total) by mouth daily. 30 tablet 2   No facility-administered medications prior to visit.    No Known Allergies     Objective:    BP 119/83 (BP Location: Left Arm, Patient Position: Sitting, Cuff Size: Normal)   Pulse 86   Temp (!) 97.5 F (36.4 C) (Oral)   Ht '5\' 2"'$  (1.575 m)   Wt 155 lb (70.3 kg)   SpO2 99%   BMI 28.35 kg/m  Wt Readings from Last 3 Encounters:  11/11/19 155 lb (70.3 kg)  08/08/19 153 lb (69.4 kg)  12/28/18 163 lb 6.4 oz (74.1 kg)    Physical Exam Vitals and nursing note reviewed.  Constitutional:      Appearance: She is well-developed.  HENT:  Head: Normocephalic and atraumatic.  Cardiovascular:     Rate and Rhythm: Normal rate and regular rhythm.     Heart sounds: Normal heart sounds. No murmur. No friction rub. No gallop.   Pulmonary:     Effort: Pulmonary effort is normal. No tachypnea or respiratory distress.     Breath sounds: Normal breath sounds. No decreased breath sounds, wheezing, rhonchi or rales.  Chest:     Chest wall: No tenderness.  Abdominal:     General: Bowel sounds are normal.     Palpations: Abdomen is soft.  Musculoskeletal:        General: Normal range of motion.     Cervical back: Normal range of motion.  Skin:    General: Skin is warm and dry.  Neurological:     Mental Status: She is alert and oriented to person, place, and time.     Coordination: Coordination normal.  Psychiatric:        Behavior: Behavior normal. Behavior is cooperative.        Thought Content: Thought content normal.        Judgment: Judgment normal.          Patient has been counseled extensively about nutrition and exercise as well as the importance of adherence  with medications and regular follow-up. The patient was given clear instructions to go to ER or return to medical center if symptoms don't improve, worsen or new problems develop. The patient verbalized understanding.   Follow-up: Return in about 6 months (around 05/10/2020).   [name], FNP-BC [facility], [place]   11/13/2019, 9:46 PM

## 2019-11-13 ENCOUNTER — Encounter: Payer: Self-pay | Admitting: Nurse Practitioner

## 2019-11-24 MED FILL — TRUE METRIX TEST STRIP: 30 days supply | Qty: 100 | Fill #8

## 2019-11-24 MED FILL — OMEGA-3 ETHYL ESTERS 1 GM C: 1 | 30 days supply | Qty: 60 | Fill #6

## 2019-11-29 ENCOUNTER — Ambulatory Visit (HOSPITAL_COMMUNITY)
Admission: RE | Admit: 2019-11-29 | Discharge: 2019-11-29 | Disposition: A | Discharge status: Home / Self Care | Payer: Medicaid Other | Source: Ambulatory Visit | Attending: Obstetrics and Gynecology | Admitting: Obstetrics and Gynecology

## 2019-11-29 ENCOUNTER — Other Ambulatory Visit: Payer: Self-pay

## 2019-11-29 ENCOUNTER — Encounter (HOSPITAL_COMMUNITY): Payer: Self-pay

## 2019-11-29 ENCOUNTER — Other Ambulatory Visit (HOSPITAL_COMMUNITY): Payer: Self-pay

## 2019-11-29 ENCOUNTER — Ambulatory Visit
Admission: RE | Admit: 2019-11-29 | Discharge: 2019-11-29 | Disposition: A | Discharge status: Home / Self Care | Payer: Medicaid Other | Source: Ambulatory Visit | Attending: Obstetrics and Gynecology | Admitting: Obstetrics and Gynecology

## 2019-11-29 DIAGNOSIS — Z01419 Encounter for gynecological examination (general) (routine) without abnormal findings: Secondary | ICD-10-CM | POA: Insufficient documentation

## 2019-11-29 DIAGNOSIS — Z124 Encounter for screening for malignant neoplasm of cervix: Secondary | ICD-10-CM

## 2019-11-29 DIAGNOSIS — Z1231 Encounter for screening mammogram for malignant neoplasm of breast: Secondary | ICD-10-CM

## 2019-11-29 NOTE — Patient Instructions (Signed)
Explained breast self awareness with [name]. Let patient know that if today's Pap smear is normal that her next Pap smear is due in one year due to her history of an abnormal Pap smear. Referred patient to the [facility] for a screening mammogram. Appointment scheduled for Tuesday, November 29, 2019 at 1100. Patient aware of appointment and will be there. Let patient know will follow up with her within the next couple weeks with results of her Pap smear by letter or phone. Informed patient that the [facility] will follow-up with her within the next couple of weeks with results of her mammogram by letter or phone. Mallory Wall verbalized understanding.  Mallory Wall, Arvil Chaco, RN 11:37 AM

## 2019-11-29 NOTE — Progress Notes (Signed)
No complaints  Pap Smear: Pap smear completed today. Last Pap smear was 07/06/2018 at [facility] and normal with positive HPV. 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 Last two Pap smear and colposcopy results are in Epic.  Physical exam: Breasts Breasts symmetrical. No skin abnormalities bilateral breasts. No nipple retraction bilateral breasts. No nipple discharge bilateral breasts. No lymphadenopathy. No lumps palpated bilateral breasts. No complaints of pain or tenderness on exam. Referred patient to the [facility] for a screening mammogram. Appointment scheduled for Tuesday, November 29, 2019 at 1100.        Pelvic/Bimanual   Ext Genitalia No lesions, no swelling and no discharge observed on external genitalia.         Vagina Vagina pink and normal texture. No lesions or discharge observed in vagina.          Cervix Cervix is present. Cervix pink and of normal texture. No discharge observed.     Uterus Uterus is present and palpable. Uterus in normal position and normal size.        Adnexae Bilateral ovaries present and palpable. No tenderness on palpation.         Rectovaginal No rectal exam completed today since patient had no rectal complaints. No skin abnormalities observed on exam.    Smoking History: Patient has never smoked.  Patient Navigation: Patient education provided. Access to services provided for patient through [facility] program.   Colorectal Cancer Screening: Per patient has never had a colonoscopy completed. No complaints today.   Breast and Cervical Cancer Risk Assessment: Patient has no family history of breast cancer, known genetic mutations, or radiation treatment to the chest before age 36. Patient has a history of cervical dysplasia. Patient has no history of being immunocompromised or  DES exposure in-utero.  Risk Assessment    Risk Scores      11/29/2019   Last edited by: Demetrius Revel, LPN   5-year risk: 1.4 %   Lifetime risk: 7.5 %

## 2019-11-30 LAB — CYTOLOGY - PAP
Comment: NEGATIVE
High risk HPV: POSITIVE — AB

## 2019-12-06 MED FILL — FAMOTIDINE 20 MG TABS: 20 | 30 days supply | Qty: 30 | Fill #1

## 2019-12-06 MED FILL — LISINOPRIL 10 MG TABS: 10 | 30 days supply | Qty: 30 | Fill #7

## 2019-12-06 MED FILL — ?ROSUVASTATIN CALCIUM 5MG T: 5 | 30 days supply | Qty: 30 | Fill #7

## 2019-12-06 MED FILL — ?METFORMIN HCL 500MG TABLET: 500 | 30 days supply | Qty: 30 | Fill #1

## 2019-12-07 ENCOUNTER — Ambulatory Visit: Payer: Self-pay | Attending: Nurse Practitioner

## 2019-12-07 ENCOUNTER — Other Ambulatory Visit: Payer: Self-pay

## 2019-12-19 ENCOUNTER — Telehealth: Payer: Self-pay

## 2019-12-21 ENCOUNTER — Telehealth: Payer: Self-pay

## 2019-12-21 NOTE — Telephone Encounter (Signed)
Patient informed Pap results-LSIL and HPV positive, and appointment date/time for colposcopy ( 01/02/20 @ 3:30pm). Patient voiced understanding, and accepted appointment date/time.

## 2019-12-21 NOTE — Telephone Encounter (Signed)
Error. See previous telephone note.

## 2019-12-26 MED FILL — OMEGA-3 ETHYL ESTERS 1 GM C: 1 | 30 days supply | Qty: 60 | Fill #7

## 2019-12-26 MED FILL — TRUE METRIX TEST STRIP: 30 days supply | Qty: 100 | Fill #9

## 2020-01-02 ENCOUNTER — Other Ambulatory Visit: Payer: Self-pay

## 2020-01-02 ENCOUNTER — Other Ambulatory Visit (HOSPITAL_COMMUNITY)
Admission: RE | Admit: 2020-01-02 | Discharge: 2020-01-02 | Disposition: A | Discharge status: Home / Self Care | Payer: Medicaid Other | Source: Ambulatory Visit | Attending: Obstetrics & Gynecology | Admitting: Obstetrics & Gynecology

## 2020-01-02 ENCOUNTER — Encounter: Payer: Self-pay | Admitting: Obstetrics & Gynecology

## 2020-01-02 ENCOUNTER — Ambulatory Visit (INDEPENDENT_AMBULATORY_CARE_PROVIDER_SITE_OTHER): Payer: Self-pay | Admitting: Obstetrics & Gynecology

## 2020-01-02 VITALS — BP 127/86 | HR 84 | Wt 156.0 lb

## 2020-01-02 DIAGNOSIS — N870 Mild cervical dysplasia: Secondary | ICD-10-CM

## 2020-01-02 NOTE — Progress Notes (Signed)
Patient ID: Veroncia Jezek, female   DOB: 1961-09-14, 59 y.o.   MRN: [id number]  Chief Complaint  Patient presents with  . Colposcopy    HPI Asjah Rauda is a 59 y.o. female.  G1P0010 No LMP recorded. Patient is postmenopausal.  HPI  Indications: Pap smear on February 2021 showed: low-grade squamous intraepithelial neoplasia (LGSIL - encompassing HPV,mild dysplasia,CIN I). Previous colposcopy: CIN 1 and in 2017. Prior cervical treatment: no treatment.  Past Medical History:  Diagnosis Date  . Allergy   . Back pain   . Diabetes mellitus without complication ([place])   . Gallstones   . Headache(784.0)   . Hyperlipidemia   . Vaginal Pap smear, abnormal     Past Surgical History:  Procedure Laterality Date  . CHOLECYSTECTOMY N/A 01/13/2018   Procedure: LAPAROSCOPIC CHOLECYSTECTOMY;  Surgeon: [name], MD;  Location: [facility];  Service: General;  Laterality: N/A;  . NO PAST SURGERIES      Family History  Problem Relation Age of Onset  . CAD Mother   . Hypertension Mother   . Diabetes type II Sister   . Lung disease Sister   . Colon cancer Neg Hx   . Esophageal cancer Neg Hx   . Pancreatic cancer Neg Hx   . Rectal cancer Neg Hx   . Stomach cancer Neg Hx     Social History Social History   Tobacco Use  . Smoking status: Current Every Day Smoker    Packs/day: 1.00    Types: Cigarettes  . Smokeless tobacco: Never Used  Substance Use Topics  . Alcohol use: No  . Drug use: No    No Known Allergies  Current Outpatient Medications  Medication Sig Dispense Refill  . acetaminophen (TYLENOL) 325 MG tablet Take 650 mg by mouth every 6 (six) hours as needed.    . Blood Glucose Monitoring Suppl (CONTOUR NEXT MONITOR) w/Device KIT 1 each by Does not apply route daily before breakfast. (Patient not taking: Reported on 05/02/2019) 1 kit 0  . Blood Glucose Monitoring Suppl (TRUE METRIX METER) w/Device KIT Use as Direct. 1 kit 0  . CONTOUR NEXT TEST  test strip USE AS INSTRUCTED (Patient not taking: Reported on 08/08/2019) 100 strip 3  . famotidine (PEPCID) 20 MG tablet Take 1 tablet (20 mg total) by mouth daily. 90 tablet 2  . Lancet Devices (MICROLET NEXT LANCING DEVICE) MISC 1 each by Does not apply route daily before breakfast. 1 each 0  . lisinopril (ZESTRIL) 10 MG tablet Take 1 tablet (10 mg total) by mouth daily. 90 tablet 3  . metFORMIN (GLUCOPHAGE) 500 MG tablet TAKE 1 TABLET (500 MG TOTAL) BY MOUTH DAILY WITH BREAKFAST FOR 30 DAYS. 30 tablet 3  . Microlet Lancets MISC USE AS DIRECTED DAILY BEFORE BREAKFAST (Patient not taking: Reported on 05/02/2019) 100 each 1  . omega-3 acid ethyl esters (LOVAZA) 1 g capsule TAKE 1 CAPSULE BY MOUTH TWICE DAILY 180 capsule 3  . ondansetron (ZOFRAN) 4 MG tablet Take 1 tablet (4 mg total) by mouth every 8 (eight) hours as needed for nausea or vomiting. (Patient not taking: Reported on 06/13/2019) 20 tablet 0  . rosuvastatin (CRESTOR) 5 MG tablet Take 1 tablet (5 mg total) by mouth daily. Patient will pick up scripts tomorrow 05-03-2019 90 tablet 3  . TRUEplus Lancets 28G MISC Use as Direct. 100 each 12   No current facility-administered medications for this visit.    Review of Systems Review of Systems  Blood pressure  127/86, pulse 84, weight 156 lb (70.8 kg).  Physical Exam Physical Exam  Data Reviewed Pap and 2017 Bx  Assessment    Procedure Details  The risks and benefits of the procedure and Written informed consent obtained.  Speculum placed in vagina and excellent visualization of cervix achieved, cervix swabbed x 3 with acetic acid solution. SCJ and TZ seen, no endocervical lesion, only mild HPV changes noted with no AWE or abnormal vasculature Specimens: ECC and Bx at 12  Complications: none.     Plan    Specimens labelled and sent to Pathology. Notify of Pathology results in 1-2 weeks.      Emeterio Reeve 01/02/2020, 3:58 PM

## 2020-01-02 NOTE — Patient Instructions (Signed)
Colposcopy, Care After This sheet gives you information about how to care for yourself after your procedure. Your doctor may also give you more specific instructions. If you have problems or questions, contact your doctor. What can I expect after the procedure? If you did not have a tissue sample removed (did not have a biopsy), you may only have some spotting for a few days. You can go back to your normal activities. If you had a tissue sample removed, it is common to have:  Soreness and pain. This may last for a few days.  Light-headedness.  Mild bleeding from your vagina or dark-colored, grainy discharge from your vagina. This may last for a few days. You may need to wear a sanitary pad.  Spotting for at least 48 hours after the procedure. Follow these instructions at home:   Take over-the-counter and prescription medicines only as told by your doctor. Ask your doctor what medicines you can start taking again. This is very important if you take blood-thinning medicine.  Do not drive or use heavy machinery while taking prescription pain medicine.  For 3 days, or as long as your doctor tells you, avoid: ? Douching. ? Using tampons. ? Having sex.  If you use birth control (contraception), keep using it.  Limit activity for the first day after the procedure. Ask your doctor what activities are safe for you.  It is up to you to get the results of your procedure. Ask your doctor when your results will be ready.  Keep all follow-up visits as told by your doctor. This is important. Contact a doctor if:  You get a skin rash. Get help right away if:  You are bleeding a lot from your vagina. It is a lot of bleeding if you are using more than one pad an hour for 2 hours in a row.  You have clumps of blood (blood clots) coming from your vagina.  You have a fever.  You have chills  You have pain in your lower belly (pelvic area).  You have signs of infection, such as vaginal  discharge that is: ? Different than usual. ? Yellow. ? Bad-smelling.  You have very pain or cramps in your lower belly that do not get better with medicine.  You feel light-headed.  You feel dizzy.  You pass out (faint). Summary  If you did not have a tissue sample removed (did not have a biopsy), you may only have some spotting for a few days. You can go back to your normal activities.  If you had a tissue sample removed, it is common to have mild pain and spotting for 48 hours.  For 3 days, or as long as your doctor tells you, avoid douching, using tampons and having sex.  Get help right away if you have bleeding, very bad pain, or signs of infection. This information is not intended to replace advice given to you by your health care provider. Make sure you discuss any questions you have with your health care provider. Document Revised: 09/25/2017 Document Reviewed: 07/02/2016 Elsevier Patient Education  2020 [employer]  

## 2020-01-05 LAB — SURGICAL PATHOLOGY

## 2020-01-06 MED FILL — LISINOPRIL 10 MG TABS: 10 | 30 days supply | Qty: 30 | Fill #8

## 2020-01-06 MED FILL — ?ROSUVASTATIN CALCIUM 5MG T: 5 | 30 days supply | Qty: 30 | Fill #8

## 2020-01-06 MED FILL — ?METFORMIN HCL 500MG TABLET: 500 | 30 days supply | Qty: 30 | Fill #2

## 2020-01-24 MED FILL — OMEGA-3 ETHYL ESTERS 1 GM C: 1 | 30 days supply | Qty: 60 | Fill #8

## 2020-01-24 MED FILL — TRUE METRIX TEST STRIP: 30 days supply | Qty: 100 | Fill #10

## 2020-02-08 MED FILL — ROSUVASTATIN CALCIUM 5 MG T: 5 | 30 days supply | Qty: 30 | Fill #9

## 2020-02-08 MED FILL — ?METFORMIN HCL 500MG TABLET: 500 | 30 days supply | Qty: 30 | Fill #3

## 2020-02-08 MED FILL — FAMOTIDINE 20 MG TABS: 20 | 30 days supply | Qty: 30 | Fill #2

## 2020-02-08 MED FILL — LISINOPRIL 10 MG TABS: 10 | 30 days supply | Qty: 30 | Fill #9

## 2020-02-15 ENCOUNTER — Encounter: Payer: Self-pay | Admitting: *Deleted

## 2020-02-17 MED FILL — TRUE METRIX TEST STRIP: 30 days supply | Qty: 100 | Fill #11

## 2020-02-27 MED FILL — OMEGA-3 ETHYL ESTERS 1 GM C: 1 | 30 days supply | Qty: 60 | Fill #9

## 2020-03-09 ENCOUNTER — Other Ambulatory Visit: Payer: Self-pay | Admitting: Family Medicine

## 2020-03-09 DIAGNOSIS — E119 Type 2 diabetes mellitus without complications: Secondary | ICD-10-CM

## 2020-03-19 MED FILL — TRUE METRIX TEST STRIP: 50 days supply | Qty: 100 | Fill #0

## 2020-03-29 MED FILL — OMEGA-3 ETHYL ESTERS 1 GM C: 1 | 30 days supply | Qty: 60 | Fill #10

## 2020-04-09 MED FILL — METFORMIN HCL 500 MG TABS: 500 | 30 days supply | Qty: 30 | Fill #1

## 2020-04-09 MED FILL — ROSUVASTATIN CALCIUM 5 MG T: 5 | 30 days supply | Qty: 30 | Fill #11

## 2020-04-09 MED FILL — LISINOPRIL 10 MG TABS: 10 | 30 days supply | Qty: 30 | Fill #11

## 2020-04-27 MED FILL — TRUE METRIX TEST STRIP: 50 days supply | Qty: 100 | Fill #1

## 2020-04-27 MED FILL — OMEGA-3 ETHYL ESTERS 1 GM C: 1 | 30 days supply | Qty: 60 | Fill #11

## 2020-05-04 ENCOUNTER — Other Ambulatory Visit: Payer: Self-pay | Admitting: Nurse Practitioner

## 2020-05-04 ENCOUNTER — Other Ambulatory Visit: Payer: Self-pay

## 2020-05-04 ENCOUNTER — Encounter: Payer: Self-pay | Admitting: Nurse Practitioner

## 2020-05-04 ENCOUNTER — Ambulatory Visit: Payer: Self-pay | Attending: Nurse Practitioner | Admitting: Nurse Practitioner

## 2020-05-04 DIAGNOSIS — E785 Hyperlipidemia, unspecified: Secondary | ICD-10-CM

## 2020-05-04 DIAGNOSIS — K219 Gastro-esophageal reflux disease without esophagitis: Secondary | ICD-10-CM

## 2020-05-04 DIAGNOSIS — I10 Essential (primary) hypertension: Secondary | ICD-10-CM

## 2020-05-04 DIAGNOSIS — E119 Type 2 diabetes mellitus without complications: Secondary | ICD-10-CM

## 2020-05-04 DIAGNOSIS — R102 Pelvic and perineal pain: Secondary | ICD-10-CM

## 2020-05-04 DIAGNOSIS — R399 Unspecified symptoms and signs involving the genitourinary system: Secondary | ICD-10-CM

## 2020-05-04 MED ORDER — METFORMIN HCL 500 MG PO TABS: 500.0000 mg | ORAL_TABLET | Freq: Every day | ORAL | 0 refills | Status: DC

## 2020-05-04 MED ORDER — FAMOTIDINE 20 MG PO TABS: 20.0000 mg | ORAL_TABLET | Freq: Every day | ORAL | 2 refills | Status: DC

## 2020-05-04 MED ORDER — OMEGA-3-ACID ETHYL ESTERS 1 G PO CAPS: 1.0000 | ORAL_CAPSULE | Freq: Two times a day (BID) | ORAL | 3 refills | Status: DC

## 2020-05-04 MED ORDER — ROSUVASTATIN CALCIUM 5 MG PO TABS: 5.0000 mg | ORAL_TABLET | Freq: Every day | ORAL | 3 refills | Status: DC

## 2020-05-04 MED FILL — FAMOTIDINE 20 MG TABS: 20 | 30 days supply | Qty: 30 | Fill #0

## 2020-05-04 MED FILL — ROSUVASTATIN CALCIUM 5 MG T: 5 | 30 days supply | Qty: 30 | Fill #0

## 2020-05-04 MED FILL — METFORMIN HCL 500 MG TABS: 500 | 30 days supply | Qty: 30 | Fill #0

## 2020-05-04 NOTE — Progress Notes (Signed)
Virtual Visit via Telephone Note Due to national recommendations of social distancing due to [place] 19, telehealth visit is felt to be most appropriate for this patient at this time.  I discussed the limitations, risks, security and privacy concerns of performing an evaluation and management service by telephone and the availability of in person appointments. I also discussed with the patient that there may be a patient responsible charge related to this service. The patient expressed understanding and agreed to proceed.    I connected with Mallory Wall on 05/04/20  at   9:10 AM EDT  EDT by telephone and verified that I am speaking with the correct person using two identifiers.   Consent I discussed the limitations, risks, security and privacy concerns of performing an evaluation and management service by telephone and the availability of in person appointments. I also discussed with the patient that there may be a patient responsible charge related to this service. The patient expressed understanding and agreed to proceed.   Location of Patient: Private Residence    Location of Provider: [place] [facility] [place] participating in Telemedicine visit: [name] FNP-BC [place]    History of Present Illness: Telemedicine visit for: Follow Up   Edema: Patient complains of edema. The location of the edema is ankle(s) bilateral, feet bilateral, bilateral toe(s) with numbness in right great toe.  The edema has been mild and moderate.  Onset of symptoms was a few months ago, unchanged since that time. The edema is present intermittently. The swelling has been aggravated by dependency of involved area, relieved by nothing, and been associated with nothing. Cardiac risk factors include diabetes mellitus, dyslipidemia, hypertension and smoking/ tobacco exposure.   UTI symptoms: Patient complains of incomplete bladder emptying and suprapubic  pressure She has had symptoms for a few months. Patient also complains of urinary frequency and right sided abdominal pain. Patient denies fever and vaginal discharge. Patient does not have a history of recurrent UTI.  Patient does not have a history of pyelonephritis.   DM TYPE 2 Well controlled. Fasting averages 80-103.  She endorses medication adherence taking Metformin 500 mg daily.  Currently on low-dose ACE and statin.  Denies any statin intolerance or chronic cough.  Lab Results  Component Value Date   HGBA1C 6.7 (A) 11/11/2019   Lab Results  Component Value Date   LDLCALC 66 05/03/2019    Well controlled.  BP Readings from Last 3 Encounters:  01/02/20 127/86  11/29/19 (!) 138/92  11/11/19 119/83     Past Medical History:  Diagnosis Date  . Allergy   . Back pain   . Diabetes mellitus without complication ([place])   . Gallstones   . Headache(784.0)   . Hyperlipidemia   . Vaginal Pap smear, abnormal     Past Surgical History:  Procedure Laterality Date  . CHOLECYSTECTOMY N/A 01/13/2018   Procedure: LAPAROSCOPIC CHOLECYSTECTOMY;  Surgeon: [name], MD;  Location: [place];  Service: General;  Laterality: N/A;  . NO PAST SURGERIES      Family History  Problem Relation Age of Onset  . CAD Mother   . Hypertension Mother   . Diabetes type II Sister   . Lung disease Sister   . Colon cancer Neg Hx   . Esophageal cancer Neg Hx   . Pancreatic cancer Neg Hx   . Rectal cancer Neg Hx   . Stomach cancer Neg Hx     Social  History   Socioeconomic History  . Marital status: Single    Spouse name: Not on file  . Number of children: Not on file  . Years of education: Not on file  . Highest education level: High school graduate  Occupational History  . Not on file  Tobacco Use  . Smoking status: Current Every Day Smoker    Packs/day: 1.00    Types: Cigarettes  . Smokeless tobacco: Never Used  Vaping Use  . Vaping Use: Never used  Substance and  Sexual Activity  . Alcohol use: No  . Drug use: No  . Sexual activity: Yes    Birth control/protection: Post-menopausal  Other Topics Concern  . Not on file  Social History Narrative   Works at [employer] Strain:   . Difficulty of Paying Living Expenses:   Food Insecurity:   . Worried About Charity fundraiser in the Last Year:   . Arboriculturist in the Last Year:   Transportation Needs: No Transportation Needs  . Lack of Transportation (Medical): No  . Lack of Transportation (Non-Medical): No  Physical Activity:   . Days of Exercise per Week:   . Minutes of Exercise per Session:   Stress:   . Feeling of Stress :   Social Connections:   . Frequency of Communication with Friends and Family:   . Frequency of Social Gatherings with Friends and Family:   . Attends Religious Services:   . Active Member of Clubs or Organizations:   . Attends Archivist Meetings:   Marland Kitchen Marital Status:      Observations/Objective: Awake, alert and oriented x 3   Review of Systems  Constitutional: Negative for fever, malaise/fatigue and weight loss.  HENT: Negative.  Negative for nosebleeds.   Eyes: Negative.  Negative for blurred vision, double vision and photophobia.  Respiratory: Negative.  Negative for cough and shortness of breath.   Cardiovascular: Negative.  Negative for chest pain, palpitations and leg swelling.  Gastrointestinal: Negative.  Negative for heartburn, nausea and vomiting.  Genitourinary:       See HPI.  Musculoskeletal: Negative.  Negative for myalgias.  Neurological: Negative.  Negative for dizziness, focal weakness, seizures and headaches.  Psychiatric/Behavioral: Negative.  Negative for suicidal ideas.    Assessment and Plan: Mallory Wall was seen today for follow-up.  Diagnoses and all orders for this visit:  Controlled type 2 diabetes mellitus without complication, without long-term current use  of insulin (HCC) -     metFORMIN (GLUCOPHAGE) 500 MG tablet; Take 1 tablet (500 mg total) by mouth daily with breakfast. -     Hemoglobin A1c; Future -     CMP14+EGFR; Future Continue blood sugar control as discussed in office today, low carbohydrate diet, and regular physical exercise as tolerated, 150 minutes per week (30 min each day, 5 days per week, or 50 min 3 days per week). Keep blood sugar logs with fasting goal of 90-130 mg/dl, post prandial (after you eat) less than 180.  For Hypoglycemia: BS <60 and Hyperglycemia BS >400; contact the clinic ASAP. Annual eye exams and foot exams are recommended.   Essential hypertension Continue all antihypertensives as prescribed.  Remember to bring in your blood pressure log with you for your follow up appointment.  DASH/Mediterranean Diets are healthier choices for HTN.    Dyslipidemia, goal LDL below 70 -     rosuvastatin (CRESTOR) 5 MG tablet; Take 1  tablet (5 mg total) by mouth daily. Patient will pick up scripts tomorrow 05-03-2019 -     omega-3 acid ethyl esters (LOVAZA) 1 g capsule; Take 1 capsule (1 g total) by mouth 2 (two) times daily. -     Lipid panel; Future INSTRUCTIONS: Work on a low fat, heart healthy diet and participate in regular aerobic exercise program by working out at least 150 minutes per week; 5 days a week-30 minutes per day. Avoid red meat/beef/steak,  fried foods. junk foods, sodas, sugary drinks, unhealthy snacking, alcohol and smoking.  Drink at least 80 oz of water per day and monitor your carbohydrate intake daily.    Pelvic pressure in female -     Cervicovaginal ancillary only; Future  UTI symptoms -     POCT URINALYSIS DIP (CLINITEK); Future -     Cervicovaginal ancillary only; Future  GERD without esophagitis Well-controlled -     famotidine (PEPCID) 20 MG tablet; Take 1 tablet (20 mg total) by mouth daily. -     CBC; Future INSTRUCTIONS: Avoid GERD Triggers: acidic, spicy or fried foods, caffeine,  coffee, sodas,  alcohol and chocolate.      Follow Up Instructions Return in about 3 months (around 08/04/2020).     I discussed the assessment and treatment plan with the patient. The patient was provided an opportunity to ask questions and all were answered. The patient agreed with the plan and demonstrated an understanding of the instructions.   The patient was advised to call back or seek an in-person evaluation if the symptoms worsen or if the condition fails to improve as anticipated.  I provided 18 minutes of non-face-to-face time during this encounter including median intraservice time, reviewing previous notes, labs, imaging, medications and explaining diagnosis and management.  [name], FNP-BC

## 2020-05-08 ENCOUNTER — Other Ambulatory Visit: Payer: Self-pay

## 2020-05-08 ENCOUNTER — Ambulatory Visit: Payer: Self-pay | Attending: Nurse Practitioner

## 2020-05-08 ENCOUNTER — Other Ambulatory Visit (HOSPITAL_COMMUNITY)
Admission: RE | Admit: 2020-05-08 | Discharge: 2020-05-08 | Disposition: A | Discharge status: Home / Self Care | Payer: Medicaid Other | Source: Ambulatory Visit | Attending: Nurse Practitioner | Admitting: Nurse Practitioner

## 2020-05-08 DIAGNOSIS — E785 Hyperlipidemia, unspecified: Secondary | ICD-10-CM

## 2020-05-08 DIAGNOSIS — K219 Gastro-esophageal reflux disease without esophagitis: Secondary | ICD-10-CM

## 2020-05-08 DIAGNOSIS — R399 Unspecified symptoms and signs involving the genitourinary system: Secondary | ICD-10-CM | POA: Insufficient documentation

## 2020-05-08 DIAGNOSIS — R102 Pelvic and perineal pain: Secondary | ICD-10-CM

## 2020-05-08 DIAGNOSIS — E119 Type 2 diabetes mellitus without complications: Secondary | ICD-10-CM

## 2020-05-08 LAB — POCT URINALYSIS DIP (CLINITEK)
Bilirubin, UA: NEGATIVE
Blood, UA: NEGATIVE
Glucose, UA: NEGATIVE mg/dL
Ketones, POC UA: NEGATIVE mg/dL
Leukocytes, UA: NEGATIVE
Nitrite, UA: NEGATIVE
POC PROTEIN,UA: NEGATIVE
Spec Grav, UA: >=1.03 — AB (ref 1.010–1.025)
Urobilinogen, UA: 1 E.U./dL
pH, UA: 5.5 (ref 5.0–8.0)

## 2020-05-09 ENCOUNTER — Other Ambulatory Visit: Payer: Self-pay | Admitting: Nurse Practitioner

## 2020-05-09 LAB — CBC
Hematocrit: 39.2 % (ref 34.0–46.6)
Hemoglobin: 13.2 g/dL (ref 11.1–15.9)
MCH: 30.3 pg (ref 26.6–33.0)
MCHC: 33.7 g/dL (ref 31.5–35.7)
MCV: 90 fL (ref 79–97)
Platelets: 282 10*3/uL (ref 150–450)
RBC: 4.35 x10E6/uL (ref 3.77–5.28)
RDW: 13.1 % (ref 11.7–15.4)
WBC: 6.7 10*3/uL (ref 3.4–10.8)

## 2020-05-09 LAB — CMP14+EGFR
ALT: 19 IU/L (ref 0–32)
AST: 19 IU/L (ref 0–40)
Albumin/Globulin Ratio: 1.5 (ref 1.2–2.2)
Albumin: 4.5 g/dL (ref 3.8–4.9)
Alkaline Phosphatase: 78 IU/L (ref 48–121)
BUN/Creatinine Ratio: 21 (ref 9–23)
BUN: 12 mg/dL (ref 6–24)
Bilirubin Total: <0.2 mg/dL (ref 0.0–1.2)
CO2: 26 mmol/L (ref 20–29)
Calcium: 9.4 mg/dL (ref 8.7–10.2)
Chloride: 102 mmol/L (ref 96–106)
Creatinine, Ser: 0.58 mg/dL (ref 0.57–1.00)
GFR calc Af Amer: 117 mL/min/{1.73_m2} (ref >59)
GFR calc non Af Amer: 102 mL/min/{1.73_m2} (ref >59)
Globulin, Total: 3 g/dL (ref 1.5–4.5)
Glucose: 137 mg/dL — ABNORMAL HIGH (ref 65–99)
Potassium: 4.1 mmol/L (ref 3.5–5.2)
Sodium: 140 mmol/L (ref 134–144)
Total Protein: 7.5 g/dL (ref 6.0–8.5)

## 2020-05-09 LAB — CERVICOVAGINAL ANCILLARY ONLY
Bacterial Vaginitis (gardnerella): POSITIVE — AB
Candida Glabrata: NEGATIVE
Candida Vaginitis: NEGATIVE
Chlamydia: NEGATIVE
Comment: NEGATIVE
Comment: NEGATIVE
Comment: NEGATIVE
Comment: NEGATIVE
Comment: NEGATIVE
Comment: NORMAL
Neisseria Gonorrhea: NEGATIVE
Trichomonas: NEGATIVE

## 2020-05-09 LAB — LIPID PANEL
Chol/HDL Ratio: 3.4 ratio (ref 0.0–4.4)
Cholesterol, Total: 111 mg/dL (ref 100–199)
HDL: 33 mg/dL — ABNORMAL LOW (ref >39)
LDL Chol Calc (NIH): 68 mg/dL (ref 0–99)
Triglycerides: 40 mg/dL (ref 0–149)
VLDL Cholesterol Cal: 10 mg/dL (ref 5–40)

## 2020-05-09 LAB — HEMOGLOBIN A1C
Est. average glucose Bld gHb Est-mCnc: 143 mg/dL
Hgb A1c MFr Bld: 6.6 % — ABNORMAL HIGH (ref 4.8–5.6)

## 2020-05-09 MED ORDER — METRONIDAZOLE 500 MG PO TABS: 500.0000 mg | ORAL_TABLET | Freq: Two times a day (BID) | ORAL | 0 refills | Status: AC

## 2020-05-09 MED FILL — ?METRONIDAZOLE 500 MG TABS: 500 | 7 days supply | Qty: 14 | Fill #0

## 2020-05-10 ENCOUNTER — Other Ambulatory Visit: Payer: Self-pay | Admitting: Nurse Practitioner

## 2020-05-10 DIAGNOSIS — E119 Type 2 diabetes mellitus without complications: Secondary | ICD-10-CM

## 2020-05-10 MED FILL — LISINOPRIL 10 MG TABS: 10 | 30 days supply | Qty: 30 | Fill #0

## 2020-05-28 MED FILL — OMEGA-3 ETHYL ESTERS 1 GM C: 1 | 30 days supply | Qty: 60 | Fill #0

## 2020-06-05 ENCOUNTER — Encounter: Payer: Self-pay | Admitting: Nurse Practitioner

## 2020-06-05 ENCOUNTER — Other Ambulatory Visit: Payer: Self-pay | Admitting: Nurse Practitioner

## 2020-06-05 ENCOUNTER — Other Ambulatory Visit: Payer: Self-pay

## 2020-06-05 ENCOUNTER — Ambulatory Visit: Payer: Self-pay | Attending: Nurse Practitioner | Admitting: Nurse Practitioner

## 2020-06-05 VITALS — BP 131/85 | HR 72 | Temp 97.7°F | Ht 62.0 in | Wt 153.0 lb

## 2020-06-05 DIAGNOSIS — E785 Hyperlipidemia, unspecified: Secondary | ICD-10-CM

## 2020-06-05 DIAGNOSIS — E119 Type 2 diabetes mellitus without complications: Secondary | ICD-10-CM

## 2020-06-05 DIAGNOSIS — R5383 Other fatigue: Secondary | ICD-10-CM

## 2020-06-05 LAB — GLUCOSE, POCT (MANUAL RESULT ENTRY): POC Glucose: 121 mg/dl — AB (ref 70–99)

## 2020-06-05 MED ORDER — OMEGA-3-ACID ETHYL ESTERS 1 G PO CAPS: 1.0000 | ORAL_CAPSULE | Freq: Two times a day (BID) | ORAL | 3 refills | Status: DC

## 2020-06-05 MED ORDER — METFORMIN HCL 500 MG PO TABS: 500.0000 mg | ORAL_TABLET | Freq: Every day | ORAL | 0 refills | Status: DC

## 2020-06-05 MED ORDER — TRUEPLUS LANCETS 28G MISC: 12 refills | Status: DC

## 2020-06-05 MED ORDER — TRUE METRIX BLOOD GLUCOSE TEST VI STRP: ORAL_STRIP | 12 refills | Status: DC

## 2020-06-05 MED ORDER — ROSUVASTATIN CALCIUM 5 MG PO TABS: 5.0000 mg | ORAL_TABLET | Freq: Every day | ORAL | 3 refills | Status: DC

## 2020-06-05 MED FILL — ROSUVASTATIN CALCIUM 5 MG T: 5 | 30 days supply | Qty: 30 | Fill #0

## 2020-06-05 MED FILL — TRUEplus LANCETS 28G MISC: 30 days supply | Qty: 100 | Fill #0

## 2020-06-05 MED FILL — METFORMIN HCL 500 MG TABS: 500 | 30 days supply | Qty: 30 | Fill #0

## 2020-06-05 MED FILL — OMEGA-3 ETHYL ESTERS 1 GM C: 1 | 30 days supply | Qty: 60 | Fill #0

## 2020-06-05 MED FILL — TRUE METRIX GLUCOSE TEST ST: 30 days supply | Qty: 100 | Fill #0

## 2020-06-05 NOTE — Progress Notes (Signed)
Assessment & Plan:  Mallory Wall was seen today for blood pressure check and fatigue.  Diagnoses and all orders for this visit:  Fatigue, unspecified type -     TSH -     VITAMIN D 25 Hydroxy (Vit-D Deficiency, Fractures)  Controlled type 2 diabetes mellitus without complication, without long-term current use of insulin (HCC) -     Glucose (CBG) -     Microalbumin/Creatinine Ratio, Urine -     metFORMIN (GLUCOPHAGE) 500 MG tablet; Take 1 tablet (500 mg total) by mouth daily with breakfast. -     TRUEplus Lancets 28G MISC; Use as Direct. -     glucose blood (TRUE METRIX BLOOD GLUCOSE TEST) test strip; Use as instructed Continue blood sugar control as discussed in office today, low carbohydrate diet, and regular physical exercise as tolerated, 150 minutes per week (30 min each day, 5 days per week, or 50 min 3 days per week). Keep blood sugar logs with fasting goal of 90-130 mg/dl, post prandial (after you eat) less than 180.  For Hypoglycemia: BS <60 and Hyperglycemia BS >400; contact the clinic ASAP. Annual eye exams and foot exams are recommended.   Dyslipidemia, goal LDL below 70 -     omega-3 acid ethyl esters (LOVAZA) 1 g capsule; Take 1 capsule (1 g total) by mouth 2 (two) times daily. -     rosuvastatin (CRESTOR) 5 MG tablet; Take 1 tablet (5 mg total) by mouth daily. INSTRUCTIONS: Work on a low fat, heart healthy diet and participate in regular aerobic exercise program by working out at least 150 minutes per week; 5 days a week-30 minutes per day. Avoid red meat/beef/steak,  fried foods. junk foods, sodas, sugary drinks, unhealthy snacking, alcohol and smoking.  Drink at least 80 oz of water per day and monitor your carbohydrate intake daily.      Patient has been counseled on age-appropriate routine health concerns for screening and prevention. These are reviewed and up-to-date. Referrals have been placed accordingly. Immunizations are up-to-date or declined.    Subjective:    Chief Complaint  Patient presents with  . Blood Pressure Check    Pt. is here for BP check.   . Fatigue    Pt. stated she's been feeling tired and lack of energy.    HPI Mallory Wall 59 y.o. female presents to office today for HTN. She also endorses fatigue. Works second shift. Gets at least 8 hours of sleep at night.Drinks plenty of water. She does not take a daily MVI.   Depression screen [facility] 2/9 06/05/2020 11/11/2019 08/08/2019 05/02/2019 12/28/2018  Decreased Interest 1 0 0 0 0  Down, Depressed, Hopeless 0 0 0 3 0  PHQ - 2 Score 1 0 0 3 0  Altered sleeping 0 0 0 0 0  Tired, decreased energy 1 0 1 0 0  Change in appetite 0 0 0 0 0  Feeling bad or failure about yourself  0 0 0 1 0  Trouble concentrating 0 0 0 0 0  Moving slowly or fidgety/restless 0 0 0 0 0  Suicidal thoughts 0 0 0 0 0  PHQ-9 Score 2 0 1 4 0  Some recent data might be hidden   Review of Systems  Constitutional: Negative for fever, malaise/fatigue and weight loss.  HENT: Negative.  Negative for nosebleeds.   Eyes: Negative.  Negative for blurred vision, double vision and photophobia.  Respiratory: Negative.  Negative for cough and shortness of breath.   Cardiovascular:  Negative.  Negative for chest pain, palpitations and leg swelling.  Gastrointestinal: Negative.  Negative for heartburn, nausea and vomiting.  Musculoskeletal: Negative.  Negative for myalgias.  Neurological: Negative.  Negative for dizziness, focal weakness, seizures and headaches.  Psychiatric/Behavioral: Negative.  Negative for suicidal ideas.    Past Medical History:  Diagnosis Date  . Allergy   . Back pain   . Diabetes mellitus without complication ([place])   . Gallstones   . Headache(784.0)   . Hyperlipidemia   . Vaginal Pap smear, abnormal     Past Surgical History:  Procedure Laterality Date  . CHOLECYSTECTOMY N/A 01/13/2018   Procedure: LAPAROSCOPIC CHOLECYSTECTOMY;  Surgeon: [name], MD;  Location: [place];  Service: General;  Laterality: N/A;  . NO PAST SURGERIES      Family History  Problem Relation Age of Onset  . CAD Mother   . Hypertension Mother   . Diabetes type II Sister   . Lung disease Sister   . Colon cancer Neg Hx   . Esophageal cancer Neg Hx   . Pancreatic cancer Neg Hx   . Rectal cancer Neg Hx   . Stomach cancer Neg Hx     Social History Reviewed with no changes to be made today.   Outpatient Medications Prior to Visit  Medication Sig Dispense Refill  . acetaminophen (TYLENOL) 325 MG tablet Take 650 mg by mouth every 6 (six) hours as needed.    . Blood Glucose Monitoring Suppl (TRUE METRIX METER) w/Device KIT Use as Direct. 1 kit 0  . famotidine (PEPCID) 20 MG tablet Take 1 tablet (20 mg total) by mouth daily. 90 tablet 2  . lisinopril (ZESTRIL) 10 MG tablet TAKE 1 TABLET (10 MG TOTAL) BY MOUTH DAILY. 90 tablet 1  . Lancet Devices (MICROLET NEXT LANCING DEVICE) MISC 1 each by Does not apply route daily before breakfast. 1 each 0  . metFORMIN (GLUCOPHAGE) 500 MG tablet Take 1 tablet (500 mg total) by mouth daily with breakfast. 90 tablet 0  . rosuvastatin (CRESTOR) 5 MG tablet Take 1 tablet (5 mg total) by mouth daily. Patient will pick up scripts tomorrow 05-03-2019 90 tablet 3  . TRUEplus Lancets 28G MISC Use as Direct. 100 each 12  . Blood Glucose Monitoring Suppl (CONTOUR NEXT MONITOR) w/Device KIT 1 each by Does not apply route daily before breakfast. (Patient not taking: Reported on 05/02/2019) 1 kit 0  . CONTOUR NEXT TEST test strip USE AS INSTRUCTED (Patient not taking: Reported on 08/08/2019) 100 strip 3  . Microlet Lancets MISC USE AS DIRECTED DAILY BEFORE BREAKFAST (Patient not taking: Reported on 05/02/2019) 100 each 1  . omega-3 acid ethyl esters (LOVAZA) 1 g capsule Take 1 capsule (1 g total) by mouth 2 (two) times daily. (Patient not taking: Reported on 06/05/2020) 180 capsule 3  . ondansetron (ZOFRAN) 4 MG tablet Take 1 tablet (4 mg total) by mouth every 8  (eight) hours as needed for nausea or vomiting. (Patient not taking: Reported on 06/13/2019) 20 tablet 0   No facility-administered medications prior to visit.    No Known Allergies     Objective:    BP 131/85 (BP Location: Left Arm, Patient Position: Sitting, Cuff Size: Normal)   Pulse 72   Temp 97.7 F (36.5 C) (Temporal)   Ht 5' 2" (1.575 m)   Wt 153 lb (69.4 kg)   SpO2 99%   BMI 27.98 kg/m  Wt Readings from Last 3 Encounters:  06/05/20  153 lb (69.4 kg)  01/02/20 156 lb (70.8 kg)  11/29/19 156 lb (70.8 kg)    Physical Exam Vitals and nursing note reviewed.  Constitutional:      Appearance: She is well-developed.  HENT:     Head: Normocephalic and atraumatic.  Cardiovascular:     Rate and Rhythm: Normal rate and regular rhythm.     Heart sounds: Normal heart sounds. No murmur heard.  No friction rub. No gallop.   Pulmonary:     Effort: Pulmonary effort is normal. No tachypnea or respiratory distress.     Breath sounds: Normal breath sounds. No decreased breath sounds, wheezing, rhonchi or rales.  Chest:     Chest wall: No tenderness.  Abdominal:     General: Bowel sounds are normal.     Palpations: Abdomen is soft.  Musculoskeletal:        General: Normal range of motion.     Cervical back: Normal range of motion.  Skin:    General: Skin is warm and dry.  Neurological:     Mental Status: She is alert and oriented to person, place, and time.     Coordination: Coordination normal.  Psychiatric:        Behavior: Behavior normal. Behavior is cooperative.        Thought Content: Thought content normal.        Judgment: Judgment normal.          Patient has been counseled extensively about nutrition and exercise as well as the importance of adherence with medications and regular follow-up. The patient was given clear instructions to go to ER or return to medical center if symptoms don't improve, worsen or new problems develop. The patient verbalized  understanding.   Follow-up: Return if symptoms worsen or fail to improve.   [name], FNP-BC [facility] [place], [place]   06/05/2020, 1:22 PM

## 2020-06-06 LAB — MICROALBUMIN / CREATININE URINE RATIO
Creatinine, Urine: 155.7 mg/dL
Microalb/Creat Ratio: 11 mg/g creat (ref 0–29)
Microalbumin, Urine: 16.9 ug/mL

## 2020-06-06 LAB — TSH: TSH: 0.824 u[IU]/mL (ref 0.450–4.500)

## 2020-06-06 LAB — VITAMIN D 25 HYDROXY (VIT D DEFICIENCY, FRACTURES): Vit D, 25-Hydroxy: 23.2 ng/mL — ABNORMAL LOW (ref 30.0–100.0)

## 2020-06-11 MED FILL — TRUE METRIX TEST STRIP: 50 days supply | Qty: 100 | Fill #2

## 2020-06-11 MED FILL — LISINOPRIL 10 MG TABS: 10 | 30 days supply | Qty: 30 | Fill #1

## 2020-07-05 MED FILL — METFORMIN HCL 500 MG TABS: 500 | 30 days supply | Qty: 30 | Fill #1

## 2020-07-05 MED FILL — OMEGA-3 ETHYL ESTERS 1 GM C: 1 | 30 days supply | Qty: 60 | Fill #1

## 2020-07-05 MED FILL — ?ROSUVASTATIN CALCIUM 5MG T: 5 | 30 days supply | Qty: 30 | Fill #1

## 2020-07-05 MED FILL — LISINOPRIL 10 MG TABS: 10 | 30 days supply | Qty: 30 | Fill #2

## 2020-08-03 ENCOUNTER — Telehealth: Payer: Self-pay | Admitting: *Deleted

## 2020-08-03 NOTE — Telephone Encounter (Signed)
Will route message to provider's CMA

## 2020-08-03 NOTE — Telephone Encounter (Signed)
Copied from [place] [id number]. Topic: Appointment Scheduling - Scheduling Inquiry for Clinic >> Jul 31, 2020 12:41 PM Alease Frame wrote: Reason for CRM: Pt is experiencing sore throat she has an upcoming appt on Monday and is needing to switch it to virtual . Please reach out to pt

## 2020-08-03 NOTE — Telephone Encounter (Signed)
Copied from [place] [id number]. Topic: Appointment Scheduling - Scheduling Inquiry for Clinic >> Jul 31, 2020 12:41 PM Mallory Wall wrote: Reason for CRM: Pt is experiencing sore throat she has an upcoming appt on Monday and is needing to switch it to virtual . Please reach out to pt

## 2020-08-06 ENCOUNTER — Ambulatory Visit: Payer: Self-pay | Attending: Nurse Practitioner | Admitting: Nurse Practitioner

## 2020-08-06 ENCOUNTER — Encounter: Payer: Self-pay | Admitting: Nurse Practitioner

## 2020-08-06 ENCOUNTER — Other Ambulatory Visit: Payer: Self-pay

## 2020-08-06 DIAGNOSIS — J029 Acute pharyngitis, unspecified: Secondary | ICD-10-CM

## 2020-08-06 DIAGNOSIS — R350 Frequency of micturition: Secondary | ICD-10-CM

## 2020-08-06 DIAGNOSIS — R109 Unspecified abdominal pain: Secondary | ICD-10-CM

## 2020-08-06 DIAGNOSIS — E119 Type 2 diabetes mellitus without complications: Secondary | ICD-10-CM

## 2020-08-06 DIAGNOSIS — N760 Acute vaginitis: Secondary | ICD-10-CM

## 2020-08-06 DIAGNOSIS — Z114 Encounter for screening for human immunodeficiency virus [HIV]: Secondary | ICD-10-CM

## 2020-08-06 DIAGNOSIS — G8929 Other chronic pain: Secondary | ICD-10-CM

## 2020-08-06 DIAGNOSIS — E785 Hyperlipidemia, unspecified: Secondary | ICD-10-CM

## 2020-08-06 MED ORDER — LISINOPRIL 10 MG PO TABS: 10.0000 mg | ORAL_TABLET | Freq: Every day | ORAL | 1 refills | Status: DC

## 2020-08-06 MED ORDER — METFORMIN HCL 500 MG PO TABS: 500.0000 mg | ORAL_TABLET | Freq: Every day | ORAL | 0 refills | Status: DC

## 2020-08-06 MED ORDER — ROSUVASTATIN CALCIUM 5 MG PO TABS: 5.0000 mg | ORAL_TABLET | Freq: Every day | ORAL | 3 refills | Status: DC

## 2020-08-06 MED ORDER — TRUEPLUS LANCETS 28G MISC: 12 refills | Status: DC

## 2020-08-06 MED ORDER — OMEGA-3-ACID ETHYL ESTERS 1 G PO CAPS: 1.0000 | ORAL_CAPSULE | Freq: Two times a day (BID) | ORAL | 3 refills | Status: DC

## 2020-08-06 MED FILL — ROSUVASTATIN CALCIUM 5 MG T: 5 | 30 days supply | Qty: 30 | Fill #2

## 2020-08-06 MED FILL — METFORMIN HCL 500 MG TABS: 500 | 30 days supply | Qty: 30 | Fill #2

## 2020-08-06 MED FILL — OMEGA-3 ETHYL ESTERS 1 GM C: 1 | 30 days supply | Qty: 60 | Fill #2

## 2020-08-06 MED FILL — LISINOPRIL 10 MG TABS: 10 | 30 days supply | Qty: 30 | Fill #3

## 2020-08-06 NOTE — Telephone Encounter (Signed)
Pt. Had an appt. W/ PCP. For her concerns.

## 2020-08-06 NOTE — Progress Notes (Signed)
Virtual Visit via Telephone Note Due to national recommendations of social distancing due to [place] 19, telehealth visit is felt to be most appropriate for this patient at this time.  I discussed the limitations, risks, security and privacy concerns of performing an evaluation and management service by telephone and the availability of in person appointments. I also discussed with the patient that there may be a patient responsible charge related to this service. The patient expressed understanding and agreed to proceed.    I connected with Mallory Wall on 08/06/20  at  11:10 AM EDT  EDT by telephone and verified that I am speaking with the correct person using two identifiers.   Consent I discussed the limitations, risks, security and privacy concerns of performing an evaluation and management service by telephone and the availability of in person appointments. I also discussed with the patient that there may be a patient responsible charge related to this service. The patient expressed understanding and agreed to proceed.   Location of Patient: Private Residence   Location of Provider: [place] participating in Telemedicine visit: Mallory Rankins FNP-BC [place]    History of Present Illness: Telemedicine visit for: Sore throat and abdominal pain  Sore Throat: Patient complains of sore throat. Associated symptoms include chest congestion, productive cough, sore throat and sometimes feels like her throat is closing up at night.  Onset of symptoms was 10 days ago, unchanged since that time. She has had poor po intake. She has not had recent close exposure to someone with proven streptococcal pharyngitis. Sore throat started on September 30th. She went to an outdoor event the night before and woke up the next morning with chills, cough and sore throat. Negative COVID test on 08-01-2020. She does continue to smoke.     Abdominal Pain She has a history of gallstones viewed on imaging. Pain is chronic with onset on and off over the past 1.5 years. Pain radiates from right to left side. Not related to food. Associated symptoms: urinary frequency and vaginal irritation.  She is sexually active.     Past Medical History:  Diagnosis Date  . Allergy   . Back pain   . Diabetes mellitus without complication ([place])   . Gallstones   . Headache(784.0)   . Hyperlipidemia   . Vaginal Pap smear, abnormal     Past Surgical History:  Procedure Laterality Date  . CHOLECYSTECTOMY N/A 01/13/2018   Procedure: LAPAROSCOPIC CHOLECYSTECTOMY;  Surgeon: [name], MD;  Location: [facility];  Service: General;  Laterality: N/A;  . NO PAST SURGERIES      Family History  Problem Relation Age of Onset  . CAD Mother   . Hypertension Mother   . Diabetes type II Sister   . Lung disease Sister   . Colon cancer Neg Hx   . Esophageal cancer Neg Hx   . Pancreatic cancer Neg Hx   . Rectal cancer Neg Hx   . Stomach cancer Neg Hx     Social History   Socioeconomic History  . Marital status: Single    Spouse name: Not on file  . Number of children: Not on file  . Years of education: Not on file  . Highest education level: High school graduate  Occupational History  . Not on file  Tobacco Use  . Smoking status: Current Every Day Smoker    Packs/day: 1.00    Types: Cigarettes  .  Smokeless tobacco: Never Used  Vaping Use  . Vaping Use: Never used  Substance and Sexual Activity  . Alcohol use: No  . Drug use: No  . Sexual activity: Yes    Birth control/protection: Post-menopausal  Other Topics Concern  . Not on file  Social History Narrative   Works at [employer] Strain:   . Difficulty of Paying Living Expenses: Not on file  Food Insecurity:   . Worried About Charity fundraiser in the Last Year: Not on file  . Ran Out of  Food in the Last Year: Not on file  Transportation Needs: No Transportation Needs  . Lack of Transportation (Medical): No  . Lack of Transportation (Non-Medical): No  Physical Activity:   . Days of Exercise per Week: Not on file  . Minutes of Exercise per Session: Not on file  Stress:   . Feeling of Stress : Not on file  Social Connections:   . Frequency of Communication with Friends and Family: Not on file  . Frequency of Social Gatherings with Friends and Family: Not on file  . Attends Religious Services: Not on file  . Active Member of Clubs or Organizations: Not on file  . Attends Archivist Meetings: Not on file  . Marital Status: Not on file     Observations/Objective: Awake, alert and oriented x 3   Review of Systems  Constitutional: Negative for fever, malaise/fatigue and weight loss.  HENT: Positive for sore throat. Negative for nosebleeds.   Eyes: Negative.  Negative for blurred vision, double vision and photophobia.  Respiratory: Positive for cough. Negative for shortness of breath.   Cardiovascular: Negative.  Negative for chest pain, palpitations and leg swelling.  Gastrointestinal: Positive for abdominal pain. Negative for blood in stool, constipation, diarrhea, heartburn, melena, nausea and vomiting.  Genitourinary: Positive for frequency.  Musculoskeletal: Negative.  Negative for myalgias.  Neurological: Negative.  Negative for dizziness, focal weakness, seizures and headaches.  Psychiatric/Behavioral: Negative.  Negative for suicidal ideas.    Assessment and Plan: [name] was seen today for sore throat and abdominal pain.  Diagnoses and all orders for this visit:  Pharyngitis, unspecified etiology -     POCT Mono (Epstein Barr Virus); Future -     Cancel: Rapid Strep A; Future -     CBC; Future  Chronic abdominal pain -     US Abdomen Complete; Future -     POCT URINALYSIS DIP (CLINITEK); Future -     Cervicovaginal ancillary only;  Future  Frequency of urination -     POCT URINALYSIS DIP (CLINITEK); Future -     Hemoglobin A1c; Future  Acute vaginitis -     Cervicovaginal ancillary only; Future  Controlled type 2 diabetes mellitus without complication, without long-term current use of insulin (HCC) -     lisinopril (ZESTRIL) 10 MG tablet; Take 1 tablet (10 mg total) by mouth daily. -     metFORMIN (GLUCOPHAGE) 500 MG tablet; Take 1 tablet (500 mg total) by mouth daily with breakfast. -     TRUEplus Lancets 28G MISC; Use as Direct. -     Hemoglobin A1c; Future  Dyslipidemia, goal LDL below 70 -     omega-3 acid ethyl esters (LOVAZA) 1 g capsule; Take 1 capsule (1 g total) by mouth 2 (two) times daily. -     rosuvastatin (CRESTOR) 5 MG tablet; Take 1 tablet (5 mg total) by  mouth daily.  Encounter for screening for HIV -     HIV antibody (with reflex); Future     Follow Up Instructions Return in about 3 months (around 11/06/2020).     I discussed the assessment and treatment plan with the patient. The patient was provided an opportunity to ask questions and all were answered. The patient agreed with the plan and demonstrated an understanding of the instructions.   The patient was advised to call back or seek an in-person evaluation if the symptoms worsen or if the condition fails to improve as anticipated.  I provided 18 minutes of non-face-to-face time during this encounter including median intraservice time, reviewing previous notes, labs, imaging, medications and explaining diagnosis and management.  [name], FNP-BC

## 2020-08-08 ENCOUNTER — Other Ambulatory Visit: Payer: Self-pay

## 2020-08-08 ENCOUNTER — Other Ambulatory Visit (HOSPITAL_COMMUNITY)
Admission: RE | Admit: 2020-08-08 | Discharge: 2020-08-08 | Disposition: A | Discharge status: Home / Self Care | Payer: Medicaid Other | Source: Ambulatory Visit | Attending: Nurse Practitioner | Admitting: Nurse Practitioner

## 2020-08-08 ENCOUNTER — Ambulatory Visit: Payer: Self-pay | Attending: Nurse Practitioner

## 2020-08-08 DIAGNOSIS — G8929 Other chronic pain: Secondary | ICD-10-CM

## 2020-08-08 DIAGNOSIS — N760 Acute vaginitis: Secondary | ICD-10-CM

## 2020-08-08 DIAGNOSIS — R109 Unspecified abdominal pain: Secondary | ICD-10-CM

## 2020-08-08 DIAGNOSIS — R350 Frequency of micturition: Secondary | ICD-10-CM

## 2020-08-08 DIAGNOSIS — Z114 Encounter for screening for human immunodeficiency virus [HIV]: Secondary | ICD-10-CM

## 2020-08-08 DIAGNOSIS — E119 Type 2 diabetes mellitus without complications: Secondary | ICD-10-CM

## 2020-08-08 DIAGNOSIS — J029 Acute pharyngitis, unspecified: Secondary | ICD-10-CM

## 2020-08-08 LAB — POCT URINALYSIS DIP (CLINITEK)
Bilirubin, UA: NEGATIVE
Blood, UA: NEGATIVE
Glucose, UA: NEGATIVE mg/dL
Ketones, POC UA: NEGATIVE mg/dL
Leukocytes, UA: NEGATIVE
Nitrite, UA: NEGATIVE
POC PROTEIN,UA: NEGATIVE
Spec Grav, UA: 1.025 (ref 1.010–1.025)
Urobilinogen, UA: 0.2 E.U./dL
pH, UA: 5.5 (ref 5.0–8.0)

## 2020-08-09 LAB — CERVICOVAGINAL ANCILLARY ONLY
Bacterial Vaginitis (gardnerella): POSITIVE — AB
Candida Glabrata: NEGATIVE
Candida Vaginitis: POSITIVE — AB
Chlamydia: NEGATIVE
Comment: NEGATIVE
Comment: NEGATIVE
Comment: NEGATIVE
Comment: NEGATIVE
Comment: NEGATIVE
Comment: NORMAL
Neisseria Gonorrhea: NEGATIVE
Trichomonas: NEGATIVE

## 2020-08-09 LAB — CBC
Hematocrit: 40.6 % (ref 34.0–46.6)
Hemoglobin: 13.2 g/dL (ref 11.1–15.9)
MCH: 30.1 pg (ref 26.6–33.0)
MCHC: 32.5 g/dL (ref 31.5–35.7)
MCV: 93 fL (ref 79–97)
Platelets: 326 10*3/uL (ref 150–450)
RBC: 4.38 x10E6/uL (ref 3.77–5.28)
RDW: 13.4 % (ref 11.7–15.4)
WBC: 6.6 10*3/uL (ref 3.4–10.8)

## 2020-08-09 LAB — HIV ANTIBODY (ROUTINE TESTING W REFLEX): HIV Screen 4th Generation wRfx: NONREACTIVE

## 2020-08-09 LAB — HEMOGLOBIN A1C
Est. average glucose Bld gHb Est-mCnc: 140 mg/dL
Hgb A1c MFr Bld: 6.5 % — ABNORMAL HIGH (ref 4.8–5.6)

## 2020-08-09 LAB — MONO QUAL W/RFLX QN: Mono Qual W/Rflx Qn: NEGATIVE

## 2020-08-12 ENCOUNTER — Other Ambulatory Visit: Payer: Self-pay | Admitting: Nurse Practitioner

## 2020-08-12 DIAGNOSIS — J029 Acute pharyngitis, unspecified: Secondary | ICD-10-CM

## 2020-08-12 DIAGNOSIS — R109 Unspecified abdominal pain: Secondary | ICD-10-CM

## 2020-08-12 DIAGNOSIS — G8929 Other chronic pain: Secondary | ICD-10-CM

## 2020-08-12 MED ORDER — METRONIDAZOLE 500 MG PO TABS: 500.0000 mg | ORAL_TABLET | Freq: Two times a day (BID) | ORAL | 0 refills | Status: DC

## 2020-08-12 MED ORDER — FLUCONAZOLE 150 MG PO TABS: 150.0000 mg | ORAL_TABLET | Freq: Once | ORAL | 0 refills | Status: DC

## 2020-08-13 MED FILL — metroNIDAZOLE 500 MG TABS: 500 | 7 days supply | Qty: 14 | Fill #0

## 2020-08-13 MED FILL — FLUCONAZOLE 150 MG TABLET: 150 | 1 days supply | Qty: 1 | Fill #0

## 2020-08-17 ENCOUNTER — Ambulatory Visit: Payer: Self-pay | Attending: Nurse Practitioner

## 2020-08-17 ENCOUNTER — Other Ambulatory Visit: Payer: Self-pay

## 2020-08-17 NOTE — Telephone Encounter (Signed)
Copied from [place] [id number]. Topic: Appointment Scheduling - Scheduling Inquiry for Clinic >> Aug 17, 2020 11:02 AM Erick Blinks wrote: Reason for CRM: Pt called and would like to speak to Mallory Wall, please advise. She wants to confirm that he received her bank statements via email [phone]

## 2020-08-27 ENCOUNTER — Other Ambulatory Visit: Payer: Self-pay | Admitting: Pharmacist

## 2020-08-27 DIAGNOSIS — E119 Type 2 diabetes mellitus without complications: Secondary | ICD-10-CM

## 2020-08-27 MED ORDER — ACCU-CHEK SOFTCLIX LANCETS MISC: 2 refills | Status: DC

## 2020-08-27 MED ORDER — ACCU-CHEK GUIDE VI STRP: ORAL_STRIP | 2 refills | Status: DC

## 2020-08-27 MED ORDER — ACCU-CHEK GUIDE ME W/DEVICE KIT: PACK | 0 refills | Status: DC

## 2020-08-27 MED FILL — TRUE METRIX TEST STRIP: 30 days supply | Qty: 100 | Fill #1

## 2020-09-10 MED FILL — LISINOPRIL 10 MG TABS: 10 | 30 days supply | Qty: 30 | Fill #4

## 2020-09-10 MED FILL — METFORMIN HCL 500 MG TABS: 500 | 30 days supply | Qty: 30 | Fill #1

## 2020-09-10 MED FILL — OMEGA-3 ETHYL ESTERS 1 GM C: 1 | 30 days supply | Qty: 60 | Fill #3

## 2020-09-10 MED FILL — ROSUVASTATIN CALCIUM 5 MG T: 5 | 30 days supply | Qty: 30 | Fill #3

## 2020-10-05 MED FILL — TRUE METRIX TEST STRIP: 30 days supply | Qty: 100 | Fill #2

## 2020-10-05 MED FILL — ROSUVASTATIN CALCIUM 5 MG T: 5 | 30 days supply | Qty: 30 | Fill #4

## 2020-10-05 MED FILL — OMEGA-3 ETHYL ESTERS 1 GM C: 1 | 30 days supply | Qty: 60 | Fill #4

## 2020-10-05 MED FILL — LISINOPRIL 10 MG TABS: 10 | 30 days supply | Qty: 30 | Fill #5

## 2020-10-05 MED FILL — METFORMIN HCL 500 MG TABS: 500 | 30 days supply | Qty: 30 | Fill #2

## 2020-11-05 ENCOUNTER — Ambulatory Visit: Payer: Self-pay | Attending: Nurse Practitioner | Admitting: Nurse Practitioner

## 2020-11-05 ENCOUNTER — Other Ambulatory Visit: Payer: Self-pay

## 2020-11-05 ENCOUNTER — Encounter: Payer: Self-pay | Admitting: Nurse Practitioner

## 2020-11-05 ENCOUNTER — Other Ambulatory Visit: Payer: Self-pay | Admitting: Nurse Practitioner

## 2020-11-05 VITALS — BP 127/76 | HR 87 | Temp 98.9°F | Ht 62.0 in | Wt 150.0 lb

## 2020-11-05 DIAGNOSIS — E785 Hyperlipidemia, unspecified: Secondary | ICD-10-CM

## 2020-11-05 DIAGNOSIS — I10 Essential (primary) hypertension: Secondary | ICD-10-CM

## 2020-11-05 DIAGNOSIS — F17200 Nicotine dependence, unspecified, uncomplicated: Secondary | ICD-10-CM

## 2020-11-05 DIAGNOSIS — J411 Mucopurulent chronic bronchitis: Secondary | ICD-10-CM

## 2020-11-05 DIAGNOSIS — E119 Type 2 diabetes mellitus without complications: Secondary | ICD-10-CM

## 2020-11-05 DIAGNOSIS — K219 Gastro-esophageal reflux disease without esophagitis: Secondary | ICD-10-CM

## 2020-11-05 LAB — GLUCOSE, POCT (MANUAL RESULT ENTRY): POC Glucose: 147 mg/dl — AB (ref 70–99)

## 2020-11-05 MED ORDER — OMEGA-3-ACID ETHYL ESTERS 1 G PO CAPS: 1.0000 | ORAL_CAPSULE | Freq: Two times a day (BID) | ORAL | 3 refills | Status: DC

## 2020-11-05 MED ORDER — NICOTINE 21 MG/24HR TD PT24
21.0000 mg | MEDICATED_PATCH | Freq: Every day | TRANSDERMAL | 0 refills | Status: DC
Start: 2020-11-05 — End: 2020-11-05

## 2020-11-05 MED ORDER — ACCU-CHEK GUIDE VI STRP: ORAL_STRIP | 2 refills | Status: DC

## 2020-11-05 MED ORDER — METFORMIN HCL 500 MG PO TABS: 500.0000 mg | ORAL_TABLET | Freq: Every day | ORAL | 0 refills | Status: DC

## 2020-11-05 MED ORDER — NICOTINE 14 MG/24HR TD PT24: 14.0000 mg | MEDICATED_PATCH | Freq: Every day | TRANSDERMAL | 0 refills | Status: DC

## 2020-11-05 MED ORDER — LISINOPRIL 10 MG PO TABS
10.0000 mg | ORAL_TABLET | Freq: Every day | ORAL | 1 refills | Status: DC
Start: 2020-11-05 — End: 2020-11-05

## 2020-11-05 MED ORDER — FAMOTIDINE 20 MG PO TABS: 20.0000 mg | ORAL_TABLET | Freq: Every day | ORAL | 2 refills | Status: DC

## 2020-11-05 MED ORDER — ACCU-CHEK SOFTCLIX LANCETS MISC: 2 refills | Status: DC

## 2020-11-05 MED ORDER — NICOTINE 7 MG/24HR TD PT24: 7.0000 mg | MEDICATED_PATCH | Freq: Every day | TRANSDERMAL | 0 refills | Status: DC

## 2020-11-05 MED FILL — FAMOTIDINE 20 MG TABS: 20 | 30 days supply | Qty: 30 | Fill #0

## 2020-11-05 MED FILL — OMEGA-3 ETHYL ESTERS 1 GM C: 1 | 30 days supply | Qty: 60 | Fill #0

## 2020-11-05 MED FILL — NICOTINE 21 MG/24HR PATCH: 21 | 28 days supply | Qty: 28 | Fill #0

## 2020-11-05 MED FILL — LISINOPRIL 10 MG TABS: 10 | 30 days supply | Qty: 30 | Fill #0

## 2020-11-05 MED FILL — TRUE METRIX GLUCOSE TEST ST: 50 days supply | Qty: 50 | Fill #0

## 2020-11-05 MED FILL — METFORMIN HCL 500 MG TABS: 500 | 30 days supply | Qty: 30 | Fill #0

## 2020-11-05 MED FILL — TRUEplus LANCETS 28G MISC: 90 days supply | Qty: 100 | Fill #0

## 2020-11-05 NOTE — Progress Notes (Signed)
Assessment & Plan:  Mallory Wall was seen today for follow-up.  Diagnoses and all orders for this visit:  Chronic bronchitis with productive mucopurulent cough ([place]) -     DG Chest 2 View; Future Encouraged to stop smoking  Essential hypertension -     Basic metabolic panel Continue all antihypertensives as prescribed.  Remember to bring in your blood pressure log with you for your follow up appointment.  DASH/Mediterranean Diets are healthier choices for HTN.    Controlled type 2 diabetes mellitus without complication, without long-term current use of insulin (HCC) -     Glucose (CBG) -     Microalbumin/Creatinine Ratio, Urine -     metFORMIN (GLUCOPHAGE) 500 MG tablet; Take 1 tablet (500 mg total) by mouth daily with breakfast. -     lisinopril (ZESTRIL) 10 MG tablet; Take 1 tablet (10 mg total) by mouth daily. -     glucose blood (ACCU-CHEK GUIDE) test strip; Use to check blood sugar once daily. -     Accu-Chek Softclix Lancets lancets; Use to check blood sugar once daily. Continue blood sugar control as discussed in office today, low carbohydrate diet, and regular physical exercise as tolerated, 150 minutes per week (30 min each day, 5 days per week, or 50 min 3 days per week). Keep blood sugar logs with fasting goal of 90-130 mg/dl, post prandial (after you eat) less than 180.  For Hypoglycemia: BS <60 and Hyperglycemia BS >400; contact the clinic ASAP. Annual eye exams and foot exams are recommended.  GERD without esophagitis -     famotidine (PEPCID) 20 MG tablet; Take 1 tablet (20 mg total) by mouth daily. INSTRUCTIONS: Avoid GERD Triggers: acidic, spicy or fried foods, caffeine, coffee, sodas,  alcohol and chocolate.   Dyslipidemia, goal LDL below 70 -     omega-3 acid ethyl esters (LOVAZA) 1 g capsule; Take 1 capsule (1 g total) by mouth 2 (two) times daily.  Tobacco dependence -     nicotine (NICODERM CQ - DOSED IN MG/24 HOURS) 21 mg/24hr patch; Place 1 patch (21 mg total)  onto the skin daily. -     nicotine (NICODERM CQ - DOSED IN MG/24 HR) 7 mg/24hr patch; Place 1 patch (7 mg total) onto the skin daily. -     nicotine (NICODERM CQ - DOSED IN MG/24 HOURS) 14 mg/24hr patch; Place 1 patch (14 mg total) onto the skin daily for 28 days. [place] continues to smoke 1pack of cigarettes per day. 2. Mallory Wall was counseled on the dangers of tobacco use, and was advised to quit. We reviewed specific strategies to maximize success, including removing cigarettes and smoking materials from environment, stress management and support of family/friends as well as pharmacological alternatives. 3. A total of 5 minutes was spent on counseling for smoking cessation and Isaiah is ready to quit and has chosen Nicotine patches to start today.  4. Will follow up at next scheduled office visit.    Patient has been counseled on age-appropriate routine health concerns for screening and prevention. These are reviewed and up-to-date. Referrals have been placed accordingly. Immunizations are up-to-date or declined.    Subjective:   Chief Complaint  Patient presents with  . Follow-up    Pt. Is here for 3 months F/U. Pt. Stated she still have a cough at night w/ phlegm.    HPI Mallory Wall 60 y.o. female presents to office today for follow up  has a past medical history of Allergy, Back pain,  Diabetes mellitus without complication ([place]), Gallstones, Headache(784.0), Hyperlipidemia, and Vaginal Pap smear, abnormal.    Cough: Patient complains of productive cough with sputum described as thick.  Symptoms began several months ago.  The cough is without wheezing, dyspnea or hemoptysis, productive of clear sputum and is aggravated by reclining position There are not Associated symptoms. Patient does not have new pets. Patient does not have a history of asthma. Patient does not have a history of environmental allergens. Patient did not have recent travel. Patient does have a history of  smoking. Patient  has not had a previous Chest X-ray. Treatments tried:Robitussin cough and congestion and mucinex.  She declines inhaler today. States her mother used an inhaler and she is now decreased. She would like to try to quit smoking.  She is smoking "too many cigarettes" per day.  ESSENTIAL HYPERTENSION She is taking lisinopril 10 mg daily and blood pressure is well controlled. Denies chest pain, shortness of breath, palpitations, lightheadedness, dizziness, headaches or BLE edema.  BP Readings from Last 3 Encounters:  11/05/20 127/76  06/05/20 131/85  01/02/20 127/86    DM2 Well controlled at this time.  She endorses home glucose readings less than 130 but does note a few elevated readings based on dietary adherence.  Taking metformin 500 mg daily as prescribed.  LDL is currently at goal with rosuvastatin 5 mg daily and lovaza 1 gm BID.  Lab Results  Component Value Date   HGBA1C 6.5 (H) 08/08/2020   Lab Results  Component Value Date   LDLCALC 68 05/08/2020      Review of Systems  Constitutional: Negative for fever, malaise/fatigue and weight loss.  HENT: Negative.  Negative for nosebleeds.   Eyes: Negative.  Negative for blurred vision, double vision and photophobia.  Respiratory: Positive for cough and sputum production. Negative for hemoptysis, shortness of breath and wheezing.   Cardiovascular: Negative.  Negative for chest pain, palpitations and leg swelling.  Gastrointestinal: Positive for heartburn. Negative for nausea and vomiting.  Musculoskeletal: Negative.  Negative for myalgias.  Neurological: Negative.  Negative for dizziness, focal weakness, seizures and headaches.  Psychiatric/Behavioral: Negative.  Negative for suicidal ideas.    Past Medical History:  Diagnosis Date  . Allergy   . Back pain   . Diabetes mellitus without complication ([place])   . Gallstones   . Headache(784.0)   . Hyperlipidemia   . Vaginal Pap smear, abnormal     Past Surgical  History:  Procedure Laterality Date  . CHOLECYSTECTOMY N/A 01/13/2018   Procedure: LAPAROSCOPIC CHOLECYSTECTOMY;  Surgeon: [name], MD;  Location: [facility];  Service: General;  Laterality: N/A;  . NO PAST SURGERIES      Family History  Problem Relation Age of Onset  . CAD Mother   . Hypertension Mother   . Diabetes type II Sister   . Lung disease Sister   . Colon cancer Neg Hx   . Esophageal cancer Neg Hx   . Pancreatic cancer Neg Hx   . Rectal cancer Neg Hx   . Stomach cancer Neg Hx     Social History Reviewed with no changes to be made today.   Outpatient Medications Prior to Visit  Medication Sig Dispense Refill  . acetaminophen (TYLENOL) 325 MG tablet Take 650 mg by mouth every 6 (six) hours as needed.    . Blood Glucose Monitoring Suppl (ACCU-CHEK GUIDE ME) w/Device KIT Use to check blood sugar once daily. E11.9 1 kit 0  . rosuvastatin (CRESTOR)  5 MG tablet Take 1 tablet (5 mg total) by mouth daily. 90 tablet 3  . Accu-Chek Softclix Lancets lancets Use to check blood sugar once daily. 100 each 2  . glucose blood (ACCU-CHEK GUIDE) test strip Use to check blood sugar once daily. 100 each 2  . lisinopril (ZESTRIL) 10 MG tablet Take 1 tablet (10 mg total) by mouth daily. 90 tablet 1  . omega-3 acid ethyl esters (LOVAZA) 1 g capsule Take 1 capsule (1 g total) by mouth 2 (two) times daily. 180 capsule 3  . famotidine (PEPCID) 20 MG tablet Take 1 tablet (20 mg total) by mouth daily. 90 tablet 2  . metFORMIN (GLUCOPHAGE) 500 MG tablet Take 1 tablet (500 mg total) by mouth daily with breakfast. 90 tablet 0   No facility-administered medications prior to visit.    No Known Allergies     Objective:    BP 127/76 (BP Location: Left Arm, Patient Position: Sitting, Cuff Size: Normal)   Pulse 87   Temp 98.9 F (37.2 C) (Oral)   Ht 5' 2" (1.575 m)   Wt 150 lb (68 kg)   SpO2 100%   BMI 27.44 kg/m  Wt Readings from Last 3 Encounters:  11/05/20 150 lb  (68 kg)  06/05/20 153 lb (69.4 kg)  01/02/20 156 lb (70.8 kg)    Physical Exam Vitals and nursing note reviewed.  Constitutional:      Appearance: She is well-developed and well-nourished.  HENT:     Head: Normocephalic and atraumatic.  Eyes:     Extraocular Movements: EOM normal.  Cardiovascular:     Rate and Rhythm: Normal rate and regular rhythm.     Pulses: Intact distal pulses.     Heart sounds: Normal heart sounds. No murmur heard. No friction rub. No gallop.   Pulmonary:     Effort: Pulmonary effort is normal. No tachypnea or respiratory distress.     Breath sounds: Examination of the right-upper field reveals wheezing. Examination of the left-upper field reveals wheezing. Wheezing present. No decreased breath sounds, rhonchi or rales.  Chest:     Chest wall: No tenderness.  Abdominal:     General: Bowel sounds are normal.     Palpations: Abdomen is soft.  Musculoskeletal:        General: No edema. Normal range of motion.     Cervical back: Normal range of motion.  Skin:    General: Skin is warm and dry.  Neurological:     Mental Status: She is alert and oriented to person, place, and time.     Coordination: Coordination normal.  Psychiatric:        Mood and Affect: Mood and affect normal.        Behavior: Behavior normal. Behavior is cooperative.        Thought Content: Thought content normal.        Judgment: Judgment normal.          Patient has been counseled extensively about nutrition and exercise as well as the importance of adherence with medications and regular follow-up. The patient was given clear instructions to go to ER or return to medical center if symptoms don't improve, worsen or new problems develop. The patient verbalized understanding.   Follow-up: Return in about 3 months (around 02/03/2021).   [name], FNP-BC [facility] and [employer], [place]   11/05/2020, 12:34 PM

## 2020-11-06 LAB — BASIC METABOLIC PANEL
BUN/Creatinine Ratio: 16 (ref 9–23)
BUN: 9 mg/dL (ref 6–24)
CO2: 23 mmol/L (ref 20–29)
Calcium: 9.5 mg/dL (ref 8.7–10.2)
Chloride: 100 mmol/L (ref 96–106)
Creatinine, Ser: 0.58 mg/dL (ref 0.57–1.00)
GFR calc Af Amer: 117 mL/min/{1.73_m2} (ref >59)
GFR calc non Af Amer: 102 mL/min/{1.73_m2} (ref >59)
Glucose: 133 mg/dL — ABNORMAL HIGH (ref 65–99)
Potassium: 4 mmol/L (ref 3.5–5.2)
Sodium: 139 mmol/L (ref 134–144)

## 2020-11-06 LAB — MICROALBUMIN / CREATININE URINE RATIO
Creatinine, Urine: 68.7 mg/dL
Microalb/Creat Ratio: 20 mg/g creat (ref 0–29)
Microalbumin, Urine: 14 ug/mL

## 2020-11-07 ENCOUNTER — Other Ambulatory Visit: Payer: Self-pay

## 2020-11-07 ENCOUNTER — Ambulatory Visit (HOSPITAL_COMMUNITY)
Admission: RE | Admit: 2020-11-07 | Discharge: 2020-11-07 | Disposition: A | Discharge status: Home / Self Care | Payer: Medicaid Other | Source: Ambulatory Visit | Attending: Nurse Practitioner | Admitting: Nurse Practitioner

## 2020-11-07 DIAGNOSIS — J411 Mucopurulent chronic bronchitis: Secondary | ICD-10-CM | POA: Insufficient documentation

## 2020-11-21 MED FILL — ROSUVASTATIN CALCIUM 5 MG T: 5 | 30 days supply | Qty: 30 | Fill #5

## 2020-12-10 ENCOUNTER — Other Ambulatory Visit: Payer: Self-pay

## 2020-12-10 DIAGNOSIS — Z1231 Encounter for screening mammogram for malignant neoplasm of breast: Secondary | ICD-10-CM

## 2020-12-10 MED FILL — METFORMIN HCL 500 MG TABS: 500 | 30 days supply | Qty: 30 | Fill #1

## 2020-12-10 MED FILL — OMEGA-3 ETHYL ESTERS 1 GM C: 1 | 30 days supply | Qty: 60 | Fill #1

## 2020-12-12 MED FILL — TRUE METRIX GLUCOSE TEST ST: 50 days supply | Qty: 50 | Fill #1

## 2020-12-24 MED FILL — ROSUVASTATIN CALCIUM 5 MG T: 5 | 30 days supply | Qty: 30 | Fill #6

## 2020-12-24 MED FILL — LISINOPRIL 10 MG TABS: 10 | 30 days supply | Qty: 30 | Fill #1

## 2021-01-08 ENCOUNTER — Other Ambulatory Visit: Payer: Self-pay | Admitting: Nurse Practitioner

## 2021-01-08 DIAGNOSIS — E119 Type 2 diabetes mellitus without complications: Secondary | ICD-10-CM

## 2021-01-08 NOTE — Telephone Encounter (Signed)
Copied from [place] [id number]. Topic: Quick Communication - Rx Refill/Question >> Jan 08, 2021  2:49 PM Mcneil, Ja-Kwan wrote: Medication: glucose blood (ACCU-CHEK GUIDE) test strip  Has the patient contacted their pharmacy? yes   Preferred Pharmacy (with phone number or street name): [facility] [place] [address] Phone: [phone]   Fax: [phone]  Agent: Please be advised that RX refills may take up to 3 business days. We ask that you follow-up with your pharmacy.

## 2021-01-10 ENCOUNTER — Other Ambulatory Visit: Payer: Self-pay | Admitting: Pharmacist

## 2021-01-10 DIAGNOSIS — E119 Type 2 diabetes mellitus without complications: Secondary | ICD-10-CM

## 2021-01-10 MED ORDER — ACCU-CHEK GUIDE VI STRP: ORAL_STRIP | 2 refills | Status: DC

## 2021-01-17 ENCOUNTER — Other Ambulatory Visit: Payer: Self-pay

## 2021-01-17 ENCOUNTER — Ambulatory Visit: Payer: Medicaid Other | Admitting: *Deleted

## 2021-01-17 ENCOUNTER — Ambulatory Visit
Admission: RE | Admit: 2021-01-17 | Discharge: 2021-01-17 | Disposition: A | Discharge status: Home / Self Care | Payer: No Typology Code available for payment source | Source: Ambulatory Visit | Attending: Nurse Practitioner | Admitting: Nurse Practitioner

## 2021-01-17 VITALS — BP 146/94 | Wt 153.9 lb

## 2021-01-17 DIAGNOSIS — Z1211 Encounter for screening for malignant neoplasm of colon: Secondary | ICD-10-CM

## 2021-01-17 DIAGNOSIS — Z124 Encounter for screening for malignant neoplasm of cervix: Secondary | ICD-10-CM

## 2021-01-17 NOTE — Patient Instructions (Signed)
Explained breast self awareness with [name]. Pap smear completed today. Let patient know that if today's Pap smear is normal and HPV negative that her next Pap smear will be due in one year due to her history of abnormal Pap smears. Referred patient to the [facility] for a screening mammogram on the mobile unit. Appointment scheduled Thursday, January 17, 2021 at 1100. Patient escorted to the mobile unit following BCCCP appointment for her screening mammogram. Let patient know will follow up with her within the next couple weeks with results of her Pap smear by letter or phone. Informed patient that the [facility] will follow-up with her within the next couple of weeks with results of her mammogram by letter or phone. Discussed smoking cessation with patient. Referred to the [facility] Quitline and gave resources to the free smoking cessation classes at [facility]. Mallory Wall verbalized understanding.  Ross Bender, Arvil Chaco, RN 10:14 AM

## 2021-01-17 NOTE — Progress Notes (Signed)
[name] is a 60 y.o. G1P0010 female who presents to [facility] clinic today with no complaints.    Pap Smear: Pap smear completed today. Last Pap smear was 11/29/2019 at [facility] and was LSIL with positive HPV. 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 Last three Pap smear and two colposcopy results are in Epic.  Physical exam: Breasts Breasts symmetrical. No skin abnormalities bilateral breasts. No nipple retraction bilateral breasts. No nipple discharge bilateral breasts. No lymphadenopathy. No lumps palpated bilateral breasts. No complaints of pain or tenderness on exam.       Pelvic/Bimanual Ext Genitalia No lesions, no swelling and no discharge observed on external genitalia.        Vagina Vagina pink and normal texture. No lesions or discharge observed in vagina.        Cervix Cervix is present. Cervix pink and of normal texture. No discharge observed.    Uterus Uterus is present and palpable. Uterus in normal position and normal size.        Adnexae Bilateral ovaries present and palpable. No tenderness on palpation.         Rectovaginal No rectal exam completed today since patient had no rectal complaints. No skin abnormalities observed on exam.     Smoking History: Patient is a current smoker. Discussed smoking cessation with patient. Referred to the [facility] Quitline and gave resources to the free smoking cessation classes at [facility].    Patient Navigation: Patient education provided. Access to services provided for patient through [place] program.   Colorectal Cancer Screening: Per patient had a colonoscopy completed in 2014. FIT Test given to patient to complete. No complaints today.    Breast and Cervical Cancer Risk Assessment: Patient has  family history of breast cancer, known genetic mutations, or radiation treatment to the chest before age 39. Patient has a history of cervical dysplasia. Patient has no history of being immunocompromised or DES exposure in-utero.  Risk Assessment    Risk Scores      01/17/2021 11/29/2019   Last edited by: Mallory Revel, LPN McGill, Mallory Mamie Nick, LPN   5-year risk: 1.4 % 1.4 %   Lifetime risk: 7.1 % 7.5 %         A: BCCCP exam with pap smear No complaints.  P: Referred patient to the [facility] for a screening mammogram on the mobile unit. Appointment scheduled Thursday, January 17, 2021 at 1100.  Mallory Parish, RN 01/17/2021 10:14 AM

## 2021-01-22 LAB — CYTOLOGY - PAP
Comment: NEGATIVE
Comment: NEGATIVE
HPV 16: NEGATIVE
HPV 18 / 45: NEGATIVE
High risk HPV: POSITIVE — AB

## 2021-01-23 ENCOUNTER — Telehealth: Payer: Self-pay

## 2021-01-23 NOTE — Telephone Encounter (Addendum)
Patient informed, Financial Assistance forms mailed. Referral sent to [facility]  ----- Message from Mora Bellman, MD sent at 01/23/2021  3:55 PM EDT ----- Regarding: RE: Abnormal pap Colposcopy is still recommended. Cryotherapy is another option but it is not covered by [facility] ----- Message ----- From: Demetrius Revel, LPN Sent: 05/17/5871  12:20 PM EDT To: Mora Bellman, MD Subject: Abnormal pap                                   Hi,   Ms. Hightower's 01/17/2021 pap results revealed LSIL/HPV+, per Altha Harm a colpo was recommended. However, the previous pap results on 11/29/2019 also revealed LSIL/HPV+, 01/02/2020 Colpo-benign (Dr. Roselie Awkward). Patient will do another colpo if needed, but would like to know if there are any other options. She had a lot of discomfort previously.   Thank you,  [employer], LPN [phone]

## 2021-01-23 NOTE — Telephone Encounter (Signed)
Patient informed pap results LSIL/HPV+, colposcopy is recommended. Patient verbalized understanding, and stated she has a colpo before (12/2019), was painful, will do if necessary. Patient informed will send information (referral) to [facility].

## 2021-01-26 ENCOUNTER — Other Ambulatory Visit: Payer: Self-pay

## 2021-01-27 ENCOUNTER — Other Ambulatory Visit: Payer: Self-pay

## 2021-01-27 MED FILL — Lisinopril Tab 10 MG: ORAL | 30 days supply | Qty: 30 | Fill #0 | Status: AC

## 2021-01-27 MED FILL — Rosuvastatin Calcium Tab 5 MG: ORAL | 30 days supply | Qty: 30 | Fill #0 | Status: AC

## 2021-01-28 ENCOUNTER — Other Ambulatory Visit: Payer: Self-pay

## 2021-02-04 ENCOUNTER — Other Ambulatory Visit: Payer: Self-pay

## 2021-02-04 ENCOUNTER — Encounter: Payer: Self-pay | Admitting: Nurse Practitioner

## 2021-02-04 ENCOUNTER — Ambulatory Visit: Payer: Self-pay | Attending: Nurse Practitioner | Admitting: Nurse Practitioner

## 2021-02-04 VITALS — BP 144/94 | HR 85 | Resp 18 | Ht 64.0 in | Wt 154.4 lb

## 2021-02-04 DIAGNOSIS — I10 Essential (primary) hypertension: Secondary | ICD-10-CM

## 2021-02-04 DIAGNOSIS — E119 Type 2 diabetes mellitus without complications: Secondary | ICD-10-CM

## 2021-02-04 DIAGNOSIS — M545 Low back pain, unspecified: Secondary | ICD-10-CM

## 2021-02-04 DIAGNOSIS — G8929 Other chronic pain: Secondary | ICD-10-CM

## 2021-02-04 LAB — POCT GLYCOSYLATED HEMOGLOBIN (HGB A1C): HbA1c, POC (controlled diabetic range): 6.9 % (ref 0.0–7.0)

## 2021-02-04 LAB — GLUCOSE, POCT (MANUAL RESULT ENTRY): POC Glucose: 123 mg/dl — AB (ref 70–99)

## 2021-02-04 MED ORDER — LIDOCAINE 5 % EX PTCH
1.0000 | MEDICATED_PATCH | CUTANEOUS | 0 refills | Status: DC
  Filled 2021-02-04: qty 30, 30d supply, fill #0

## 2021-02-04 MED ORDER — METHOCARBAMOL 500 MG PO TABS
500.0000 mg | ORAL_TABLET | Freq: Four times a day (QID) | ORAL | 1 refills | Status: DC
  Filled 2021-02-04: qty 90, 23d supply, fill #0

## 2021-02-04 MED ORDER — LISINOPRIL 20 MG PO TABS
20.0000 mg | ORAL_TABLET | Freq: Every day | ORAL | 3 refills | Status: DC
  Filled 2021-02-04: qty 30, 30d supply, fill #0
  Filled 2021-03-13: qty 30, 30d supply, fill #1
  Filled 2021-04-08: qty 30, 30d supply, fill #2
  Filled 2021-05-16: qty 30, 30d supply, fill #3
  Filled 2021-06-17: qty 30, 30d supply, fill #4
  Filled 2021-07-18: qty 30, 30d supply, fill #5
  Filled 2021-08-20: qty 30, 30d supply, fill #6
  Filled 2021-09-17: qty 30, 30d supply, fill #7
  Filled 2021-10-17: qty 30, 30d supply, fill #8
  Filled 2021-11-19: qty 30, 30d supply, fill #9
  Filled 2021-11-19: qty 30, 30d supply, fill #0
  Filled 2021-12-18: qty 30, 30d supply, fill #1

## 2021-02-04 MED ORDER — MELOXICAM 7.5 MG PO TABS
7.5000 mg | ORAL_TABLET | Freq: Every day | ORAL | 0 refills | Status: DC
  Filled 2021-02-04: qty 30, 30d supply, fill #0

## 2021-02-04 NOTE — Progress Notes (Signed)
Assessment & Plan:  Mallory Wall was seen today for 3 month follow up .  Diagnoses and all orders for this visit:  Controlled type 2 diabetes mellitus without complication, without long-term current use of insulin (HCC) -     POCT glucose (manual entry) -     POCT glycosylated hemoglobin (Hb A1C) Continue blood sugar control as discussed in office today, low carbohydrate diet, and regular physical exercise as tolerated, 150 minutes per week (30 min each day, 5 days per week, or 50 min 3 days per week). Keep blood sugar logs with fasting goal of 90-130 mg/dl, post prandial (after you eat) less than 180.  For Hypoglycemia: BS <60 and Hyperglycemia BS >400; contact the clinic ASAP. Annual eye exams and foot exams are recommended.   Essential hypertension -     lisinopril (ZESTRIL) 20 MG tablet; Take 1 tablet (20 mg total) by mouth daily. Continue all antihypertensives as prescribed.  Remember to bring in your blood pressure log with you for your follow up appointment.  DASH/Mediterranean Diets are healthier choices for HTN.    Chronic bilateral low back pain without sciatica -     methocarbamol (ROBAXIN) 500 MG tablet; Take 1 tablet (500 mg total) by mouth 4 (four) times daily. NEEDS PASS -     meloxicam (MOBIC) 7.5 MG tablet; Take 1 tablet (7.5 mg total) by mouth daily. NEEDS PASS -     lidocaine (LIDODERM) 5 %; Place 1 patch onto the skin daily. NEEDS PASS Work on losing weight to help reduce back pain. May alternate with heat and ice application for pain relief. May also alternate with acetaminophen as prescribed for back pain. Other alternatives include massage, acupuncture and water aerobics.  You must stay active and avoid a sedentary lifestyle.    Patient has been counseled on age-appropriate routine health concerns for screening and prevention. These are reviewed and up-to-date. Referrals have been placed accordingly. Immunizations are up-to-date or declined.    Subjective:   Chief  Complaint  Patient presents with  . 3 MONTH FOLLOW UP    HPI Mallory Wall 60 y.o. female presents to office today for follow up  has a past medical history of Allergy, Back pain, Diabetes mellitus without complication ([place]), Gallstones, Headache(784.0), Hyperlipidemia, and Vaginal Pap smear, abnormal.   Essential Hypertension Will increase lisinopril from 10 mg to 20 mg daily. Denies chest pain, shortness of breath, palpitations, lightheadedness, dizziness, headaches or BLE edema. She is also a smoker and trying to cut back using nicotine patches. Denies chest pain, shortness of breath, palpitations, lightheadedness, dizziness, headaches or BLE edema.  BP Readings from Last 3 Encounters:  02/04/21 (!) 144/94  01/17/21 (!) 146/94  11/05/20 127/76    Back pain Ongoing for 2-3 weeks. Aggravating factors: sitting, lying or standing for too long. Her job does require her to Stand up during her entire work day.   DM 2 Controlled with metformin 500 mg daily. Denies any symptoms of hypo or hyperglycemia. LDL at goal with rosuvastatin 5 mg daily and lovaza 1gm BID.  Lab Results  Component Value Date   HGBA1C 6.9 02/04/2021   Lab Results  Component Value Date   LDLCALC 68 05/08/2020    ROS  Past Medical History:  Diagnosis Date  . Allergy   . Back pain   . Diabetes mellitus without complication ([place])   . Gallstones   . Headache(784.0)   . Hyperlipidemia   . Vaginal Pap smear, abnormal  Past Surgical History:  Procedure Laterality Date  . CHOLECYSTECTOMY N/A 01/13/2018   Procedure: LAPAROSCOPIC CHOLECYSTECTOMY;  Surgeon: [name], MD;  Location: [facility];  Service: General;  Laterality: N/A;  . NO PAST SURGERIES      Family History  Problem Relation Age of Onset  . CAD Mother   . Hypertension Mother   . Diabetes type II Sister   . Lung disease Sister   . Colon cancer Neg Hx   . Esophageal cancer Neg Hx   . Pancreatic cancer Neg Hx   .  Rectal cancer Neg Hx   . Stomach cancer Neg Hx     Social History Reviewed with no changes to be made today.   Outpatient Medications Prior to Visit  Medication Sig Dispense Refill  . Accu-Chek Softclix Lancets lancets Use to check blood sugar once daily. 100 each 2  . acetaminophen (TYLENOL) 325 MG tablet Take 650 mg by mouth every 6 (six) hours as needed.    . Blood Glucose Monitoring Suppl (ACCU-CHEK GUIDE ME) w/Device KIT Use to check blood sugar once daily. E11.9 1 kit 0  . famotidine (PEPCID) 20 MG tablet TAKE 1 TABLET (20 MG TOTAL) BY MOUTH DAILY. 90 tablet 2  . glucose blood test strip USE TO CHECK BLOOD SUGAR 2 TIMES DAILY (Patient taking differently: USE TO CHECK BLOOD SUGAR 2 TIMES DAILY) 100 strip 2  . metFORMIN (GLUCOPHAGE) 500 MG tablet TAKE 1 TABLET (500 MG TOTAL) BY MOUTH DAILY WITH BREAKFAST. 90 tablet 0  . nicotine (NICODERM CQ - DOSED IN MG/24 HOURS) 14 mg/24hr patch PLACE 1 PATCH (14 MG TOTAL) ONTO THE SKIN DAILY FOR 28 DAYS. 28 patch 0  . nicotine (NICODERM CQ - DOSED IN MG/24 HOURS) 21 mg/24hr patch PLACE 1 PATCH (21 MG TOTAL) ONTO THE SKIN DAILY. 42 patch 0  . nicotine (NICODERM CQ - DOSED IN MG/24 HR) 7 mg/24hr patch PLACE 1 PATCH (7 MG TOTAL) ONTO THE SKIN DAILY. 28 patch 0  . omega-3 acid ethyl esters (LOVAZA) 1 g capsule TAKE 1 CAPSULE (1 G TOTAL) BY MOUTH 2 (TWO) TIMES DAILY. 180 capsule 3  . rosuvastatin (CRESTOR) 5 MG tablet Take 1 tablet (5 mg total) by mouth daily. 90 tablet 3  . TRUEplus Lancets 28G MISC USE TO CHECK BLOOD SUGAR ONCE DAILY. 100 each 2  . lisinopril (ZESTRIL) 10 MG tablet TAKE 1 TABLET (10 MG TOTAL) BY MOUTH DAILY. 90 tablet 1  . metroNIDAZOLE (FLAGYL) 500 MG tablet TAKE 1 TABLET (500 MG TOTAL) BY MOUTH 2 (TWO) TIMES DAILY FOR 7 DAYS. (Patient taking differently: Take by mouth 2 (two) times daily. for 7 days) 14 tablet 0  . rosuvastatin (CRESTOR) 5 MG tablet TAKE 1 TABLET (5 MG TOTAL) BY MOUTH DAILY. 90 tablet 3   No facility-administered  medications prior to visit.    No Known Allergies     Objective:    BP (!) 144/94   Pulse 85   Resp 18   Ht $R'5\' 4"'Lz$  (1.626 m)   Wt 154 lb 6.4 oz (70 kg)   SpO2 99%   BMI 26.50 kg/m  Wt Readings from Last 3 Encounters:  02/04/21 154 lb 6.4 oz (70 kg)  01/17/21 153 lb 14.4 oz (69.8 kg)  11/05/20 150 lb (68 kg)    Physical Exam       Patient has been counseled extensively about nutrition and exercise as well as the importance of adherence with medications and regular follow-up. The  patient was given clear instructions to go to ER or return to medical center if symptoms don't improve, worsen or new problems develop. The patient verbalized understanding.   Follow-up: Return for SEE [facility] IN 2-3 weeks BP check/BMP. See me in 3 months.   [name], FNP-BC [facility] [place], [place]   02/07/2021, 5:17 PM

## 2021-02-07 ENCOUNTER — Encounter: Payer: Self-pay | Admitting: Nurse Practitioner

## 2021-02-11 ENCOUNTER — Other Ambulatory Visit: Payer: Self-pay

## 2021-02-11 ENCOUNTER — Other Ambulatory Visit: Payer: Self-pay | Admitting: Nurse Practitioner

## 2021-02-11 DIAGNOSIS — E119 Type 2 diabetes mellitus without complications: Secondary | ICD-10-CM

## 2021-02-11 MED ORDER — METFORMIN HCL 500 MG PO TABS
ORAL_TABLET | Freq: Every day | ORAL | 0 refills | Status: DC
  Filled 2021-02-11: qty 30, 30d supply, fill #0
  Filled 2021-03-13: qty 30, 30d supply, fill #1
  Filled 2021-06-17: qty 30, 30d supply, fill #2

## 2021-02-11 MED FILL — Omega-3-acid Ethyl Esters Cap 1 GM: ORAL | 30 days supply | Qty: 60 | Fill #0 | Status: AC

## 2021-02-19 ENCOUNTER — Other Ambulatory Visit: Payer: Self-pay

## 2021-02-19 ENCOUNTER — Ambulatory Visit: Payer: Medicaid Other | Attending: Nurse Practitioner | Admitting: Pharmacist

## 2021-02-19 ENCOUNTER — Encounter: Payer: Self-pay | Admitting: Pharmacist

## 2021-02-19 VITALS — BP 115/80

## 2021-02-19 DIAGNOSIS — I10 Essential (primary) hypertension: Secondary | ICD-10-CM

## 2021-02-19 NOTE — Progress Notes (Signed)
   S:    PCP: Zelda   Patient arrives in good spirits. Presents to the clinic for hypertension evaluation, counseling, and management. Patient was referred and last seen by Primary Care Provider on 02/04/2021. BP was 144/94 at that visit - lisinopril dose was increased from 10 mg to 20 mg daily.   Medication adherence reported.   Current BP Medications include:  Lisinopril 20 mg daily   Dietary habits include: compliant with salt restriction; denies drinking caffeine  Exercise habits include: will occasionally walk for ~15-20 minutes; stays on her feet at work Family / Social history:  - FHx: CAD, HTN, T2DM - Tobacco: current some day smoker  - Alcohol: denies use   O:  Vitals:   02/19/21 1048  BP: 115/80   Home BP readings: gives 1 reading of 137/82 since increasing her lisinopril   Last 3 Office BP readings: BP Readings from Last 3 Encounters:  02/19/21 115/80  02/04/21 (!) 144/94  01/17/21 (!) 146/94   BMET    Component Value Date/Time   NA 139 11/05/2020 1141   K 4.0 11/05/2020 1141   CL 100 11/05/2020 1141   CO2 23 11/05/2020 1141   GLUCOSE 133 (H) 11/05/2020 1141   GLUCOSE 114 (H) 12/10/2017 0434   BUN 9 11/05/2020 1141   CREATININE 0.58 11/05/2020 1141   CREATININE 0.54 02/12/2016 1219   CALCIUM 9.5 11/05/2020 1141   GFRNONAA 102 11/05/2020 1141   GFRNONAA >89 11/24/2014 0916   GFRAA 117 11/05/2020 1141   GFRAA >89 11/24/2014 0916   Renal function: CrCl cannot be calculated (Patient's most recent lab result is older than the maximum 21 days allowed.).  Clinical ASCVD: No  The ASCVD Risk score Mikey Bussing DC Jr., et al., 2013) failed to calculate for the following reasons:   The valid total cholesterol range is 130 to 320 mg/dL  A/P: Hypertension longstanding currently at goal on current medications. BP Goal = < 130/80 mmHg. Medication adherence reported.  -Continued lisinopril 20 mg daily.  -F/u labs ordered - BMP will follow 1 month from now -Counseled on  lifestyle modifications for blood pressure control including reduced dietary sodium, increased exercise, adequate sleep.  Results reviewed and written information provided. Total time in face-to-face counseling 30 minutes.   F/U Clinic Visit in 1 month.  Benard Halsted, PharmD, [place], [place] [phone]

## 2021-02-22 ENCOUNTER — Encounter: Payer: Self-pay | Admitting: Obstetrics and Gynecology

## 2021-02-22 ENCOUNTER — Ambulatory Visit (INDEPENDENT_AMBULATORY_CARE_PROVIDER_SITE_OTHER): Payer: Self-pay | Admitting: Obstetrics and Gynecology

## 2021-02-22 ENCOUNTER — Other Ambulatory Visit (HOSPITAL_COMMUNITY)
Admission: RE | Admit: 2021-02-22 | Discharge: 2021-02-22 | Disposition: A | Discharge status: Home / Self Care | Payer: Medicaid Other | Source: Ambulatory Visit | Attending: Obstetrics and Gynecology | Admitting: Obstetrics and Gynecology

## 2021-02-22 DIAGNOSIS — N870 Mild cervical dysplasia: Secondary | ICD-10-CM

## 2021-02-22 DIAGNOSIS — Z3202 Encounter for pregnancy test, result negative: Secondary | ICD-10-CM

## 2021-02-22 LAB — POCT PREGNANCY, URINE: Preg Test, Ur: NEGATIVE

## 2021-02-22 NOTE — Progress Notes (Signed)
    GYNECOLOGY CLINIC COLPOSCOPY PROCEDURE NOTE  60 y.o. G1P0010 here for colposcopy for low-grade squamous intraepithelial neoplasia (LGSIL - encompassing HPV,mild dysplasia,CIN I) pap smear on 12/2020. Discussed role for HPV in cervical dysplasia, need for surveillance.  Patient given informed consent, signed copy in the chart, time out was performed.  Placed in lithotomy position. Cervix viewed with speculum and colposcope after application of acetic acid.   Colposcopy adequate? Yes  acetowhite lesion(s) noted at 12 and 6 o'clock; corresponding biopsies obtained.  ECC specimen obtained. Monsel's applied All specimens were labelled and sent to pathology.   Patient was given post procedure instructions.  Will follow up pathology and manage accordingly.  Routine preventative health maintenance measures emphasized.    Arlina Robes, MD, [place] Attending [place] for [place]

## 2021-02-22 NOTE — Patient Instructions (Signed)
Colposcopy, Care After This sheet gives you information about how to care for yourself after your procedure. Your doctor may also give you more specific instructions. If you have problems or questions, contact your doctor. What can I expect after the procedure? If you did not have a sample of your tissue taken out (did not have a biopsy), you may only have some spotting of blood for a few days. You can go back to your normal activities. If you had a sample of your tissue taken out, it is common to have:  Soreness and mild pain. These may last for a few days.  A light-headed feeling.  Mild bleeding or fluid (discharge) coming from your vagina. The fluid will look dark and grainy. You may have this for a few days. The fluid may be caused by a liquid that was used during your procedure. You may need to wear a sanitary pad.  Spotting of blood for at least 48 hours after the procedure. Follow these instructions at home: Medicines  Take over-the-counter and prescription medicines only as told by your doctor.  Ask your doctor what medicines you can start taking again. This is very important if you take blood thinners. Activity  Limit your activity for the first day after your procedure as told by your doctor.  For at least 3 days, or for as long as told by your doctor, avoid: ? Douching. ? Using tampons. ? Having sex.  Return to your normal activities as told by your doctor. Ask your doctor what activities are safe for you. General instructions  Drink enough fluid to keep your pee (urine) pale yellow.  Ask your doctor if you may take baths, swim, or use a hot tub. You may take showers.  If you use birth control (contraception), keep using it.  Keep all follow-up visits as told by your doctor. This is important.   Contact a doctor if:  You get a skin rash. Get help right away if:  You bleed a lot from your vagina. A lot of bleeding means you use more than one pad an hour for 2  hours in a row.  You have clumps of blood (blood clots) coming from your vagina.  You have a fever or chills.  You have signs of infection. This may be fluid coming from your vagina that is: ? Different than normal. ? Yellow. ? Bad-smelling.  You have very bad pain or cramps in your lower belly that do not get better with medicine.  You faint. Summary  If you did not have a sample of your tissue taken out, you may only have some spotting of blood for a few days. You can go back to your normal activities.  If you had a sample of your tissue taken out, it is common to have mild pain for a few days and spotting for 48 hours.  Avoid douching, using tampons, and having sex for at least 3 days after the procedure or for as long as told.  Get help right away if you have a lot of bleeding, very bad pain, or signs of infection. This information is not intended to replace advice given to you by your health care provider. Make sure you discuss any questions you have with your health care provider. Document Revised: 08/14/2020 Document Reviewed: 10/12/2019 Elsevier Patient Education  2021 [employer].

## 2021-02-25 ENCOUNTER — Other Ambulatory Visit: Payer: Self-pay

## 2021-02-25 ENCOUNTER — Telehealth: Payer: Self-pay | Admitting: Nurse Practitioner

## 2021-02-25 MED FILL — Glucose Blood Test Strip: 50 days supply | Qty: 100 | Fill #0 | Status: AC

## 2021-02-25 NOTE — Telephone Encounter (Signed)
Pt is calling to renew her orange card. CB- [phone]

## 2021-02-25 NOTE — Telephone Encounter (Signed)
Pt is calling to get clarity on rosuvastatin (CRESTOR) 5 MG tablet [[id number]] . Pharmacy advised her that [place] d/c this medication. Pt does not recall that zelda stating  that. Pt would like to confirm that Zelda would like her to stop the medication. CB- [phone]

## 2021-02-26 LAB — SURGICAL PATHOLOGY

## 2021-02-27 ENCOUNTER — Other Ambulatory Visit: Payer: Self-pay

## 2021-02-28 ENCOUNTER — Other Ambulatory Visit: Payer: Self-pay | Admitting: Nurse Practitioner

## 2021-02-28 ENCOUNTER — Other Ambulatory Visit: Payer: Self-pay

## 2021-02-28 DIAGNOSIS — E785 Hyperlipidemia, unspecified: Secondary | ICD-10-CM

## 2021-02-28 NOTE — Telephone Encounter (Signed)
I return Pt call unable to contact her or to leave VM

## 2021-02-28 NOTE — Telephone Encounter (Signed)
   Notes to clinic: medication requested is not of medication list  Review    Requested Prescriptions  Pending Prescriptions Disp Refills   rosuvastatin (CRESTOR) 5 MG tablet 90 tablet 3    Sig: TAKE 1 TABLET (5 MG TOTAL) BY MOUTH DAILY.      Cardiovascular:  Antilipid - Statins Failed - 02/28/2021 11:51 AM      Failed - HDL in normal range and within 360 days    HDL  Date Value Ref Range Status  05/08/2020 33 (L) >39 mg/dL Final          Passed - Total Cholesterol in normal range and within 360 days    Cholesterol, Total  Date Value Ref Range Status  05/08/2020 111 100 - 199 mg/dL Final          Passed - LDL in normal range and within 360 days    LDL Chol Calc (NIH)  Date Value Ref Range Status  05/08/2020 68 0 - 99 mg/dL Final          Passed - Triglycerides in normal range and within 360 days    Triglycerides  Date Value Ref Range Status  05/08/2020 40 0 - 149 mg/dL Final          Passed - Patient is not pregnant      Passed - Valid encounter within last 12 months    Recent Outpatient Visits           1 week ago Essential hypertension   [place], Jarome Matin, RPH-CPP   3 weeks ago Essential hypertension   [place], [place] W, NP   3 months ago Chronic bronchitis with productive mucopurulent cough [facility])   [place], Vernia Buff, NP   6 months ago Pharyngitis, unspecified etiology   [place], Vernia Buff, NP   8 months ago Fatigue, unspecified type   [place], Vernia Buff, NP       Future Appointments             In 2 weeks Daisy [place]oss[place], Jarome Matin, [place]   In 2 months [name], NP [place]

## 2021-02-28 NOTE — Telephone Encounter (Signed)
Pt was called and informed to continue taking medication via VM.

## 2021-03-01 ENCOUNTER — Other Ambulatory Visit: Payer: Self-pay

## 2021-03-01 MED ORDER — ROSUVASTATIN CALCIUM 5 MG PO TABS
ORAL_TABLET | Freq: Every day | ORAL | 2 refills | Status: DC
  Filled 2021-03-01: qty 30, 30d supply, fill #0
  Filled 2021-04-08: qty 30, 30d supply, fill #1

## 2021-03-04 ENCOUNTER — Other Ambulatory Visit: Payer: Self-pay

## 2021-03-13 ENCOUNTER — Other Ambulatory Visit: Payer: Self-pay

## 2021-03-14 ENCOUNTER — Other Ambulatory Visit: Payer: Self-pay

## 2021-03-14 MED FILL — Omega-3-acid Ethyl Esters Cap 1 GM: ORAL | 30 days supply | Qty: 60 | Fill #1 | Status: AC

## 2021-03-19 ENCOUNTER — Ambulatory Visit: Payer: No Typology Code available for payment source | Admitting: Pharmacist

## 2021-04-08 ENCOUNTER — Other Ambulatory Visit: Payer: Self-pay

## 2021-04-08 MED FILL — Glucose Blood Test Strip: 50 days supply | Qty: 100 | Fill #1 | Status: AC

## 2021-04-19 ENCOUNTER — Other Ambulatory Visit: Payer: Self-pay

## 2021-04-19 MED FILL — Omega-3-acid Ethyl Esters Cap 1 GM: ORAL | 30 days supply | Qty: 60 | Fill #2 | Status: AC

## 2021-04-23 ENCOUNTER — Other Ambulatory Visit: Payer: Self-pay

## 2021-05-06 ENCOUNTER — Ambulatory Visit: Payer: Self-pay | Attending: Nurse Practitioner | Admitting: Nurse Practitioner

## 2021-05-06 ENCOUNTER — Encounter: Payer: Self-pay | Admitting: Nurse Practitioner

## 2021-05-06 ENCOUNTER — Other Ambulatory Visit: Payer: Self-pay

## 2021-05-06 DIAGNOSIS — I10 Essential (primary) hypertension: Secondary | ICD-10-CM

## 2021-05-06 DIAGNOSIS — E785 Hyperlipidemia, unspecified: Secondary | ICD-10-CM

## 2021-05-06 DIAGNOSIS — E119 Type 2 diabetes mellitus without complications: Secondary | ICD-10-CM

## 2021-05-06 MED ORDER — ROSUVASTATIN CALCIUM 5 MG PO TABS
5.0000 mg | ORAL_TABLET | Freq: Every day | ORAL | 2 refills | Status: DC
  Filled 2021-05-06: qty 30, 30d supply, fill #0
  Filled 2021-06-17: qty 30, 30d supply, fill #1
  Filled 2021-07-18: qty 30, 30d supply, fill #2
  Filled 2021-08-20: qty 30, 30d supply, fill #3
  Filled 2021-09-30: qty 30, 30d supply, fill #4
  Filled 2021-11-19: qty 30, 30d supply, fill #5
  Filled 2021-11-19: qty 30, 30d supply, fill #0
  Filled 2021-12-30: qty 30, 30d supply, fill #1

## 2021-05-06 MED ORDER — OMEGA-3-ACID ETHYL ESTERS 1 G PO CAPS
ORAL_CAPSULE | ORAL | 3 refills | Status: DC
  Filled 2021-05-06: qty 180, fill #0
  Filled 2021-05-27: qty 60, 30d supply, fill #0
  Filled 2021-07-01: qty 60, 30d supply, fill #1
  Filled 2021-08-05: qty 60, 30d supply, fill #2
  Filled 2021-09-09 (×2): qty 60, 30d supply, fill #3
  Filled 2021-10-16: qty 60, 30d supply, fill #4
  Filled 2021-11-27: qty 60, 30d supply, fill #0
  Filled 2021-11-27: qty 60, 30d supply, fill #5
  Filled 2021-12-30: qty 60, 30d supply, fill #1

## 2021-05-06 NOTE — Progress Notes (Signed)
Family virtual Visit via Telephone Note Due to national recommendations of social distancing due to Mallory Wall 20, telehealth visit is felt to be most appropriate for this patient at this time.  I discussed the limitations, risks, security and privacy concerns of performing an evaluation and management service by telephone and the availability of in person appointments. I also discussed with the patient that there may be a patient responsible charge related to this service. The patient expressed understanding and agreed to proceed.    I connected with Mallory Wall on 05/06/21  at   9:10 AM EDT  EDT by telephone and verified that I am speaking with the correct person using two identifiers.  Location of Patient: Private Residence   Location of Provider: [place] and [employer] Office    Persons participating in Telemedicine visit: [name] FNP-BC Mallory Wall    History of Present Illness: Telemedicine visit for: HTN  has a past medical history of Allergy, Back pain, Diabetes mellitus without complication ([place]), Gallstones, Headache(784.0), Hyperlipidemia, and Vaginal Pap smear, abnormal.    Essential Hypertension Blood pressure is well controlled.  She is taking lisinopril 20 mg daily as prescribed. Denies chest pain, shortness of breath, palpitations, lightheadedness, dizziness, headaches or BLE edema.  She does not monitor her blood pressure at home.  BP Readings from Last 3 Encounters:  02/22/21 136/77  02/19/21 115/80  02/04/21 (!) 144/94     DM 2 Blood glucose readings average: 110-120s. Well controlled with metformin 500 mg daily.  She denies any symptoms of hypo or hyperglycemia.  LDL at goal with rosuvastatin 5 mg daily and Lovaza 1 g twice daily. Lab Results  Component Value Date   HGBA1C 6.9 02/04/2021    Lab Results  Component Value Date   [place] 68 05/08/2020     Past Medical History:  Diagnosis Date   Allergy    Back pain    Diabetes  mellitus without complication (Mallory Wall)    Gallstones    Headache(784.0)    Hyperlipidemia    Vaginal Pap smear, abnormal     Past Surgical History:  Procedure Laterality Date   CHOLECYSTECTOMY N/A 01/13/2018   Procedure: LAPAROSCOPIC CHOLECYSTECTOMY;  Surgeon: [name], MD;  Location: [place];  Service: General;  Laterality: N/A;   NO PAST SURGERIES      Family History  Problem Relation Age of Onset   CAD Mother    Hypertension Mother    Diabetes type II Sister    Lung disease Sister    Colon cancer Neg Hx    Esophageal cancer Neg Hx    Pancreatic cancer Neg Hx    Rectal cancer Neg Hx    Stomach cancer Neg Hx     Social History   Socioeconomic History   Marital status: Single    Spouse name: Not on file   Number of children: 0   Years of education: Not on file   Highest education level: High school graduate  Occupational History   Not on file  Tobacco Use   Smoking status: Every Day    Packs/day: 1.00    Pack years: 0.00    Types: Cigarettes   Smokeless tobacco: Never  Vaping Use   Vaping Use: Never used  Substance and Sexual Activity   Alcohol use: No   Drug use: No   Sexual activity: Not Currently    Birth control/protection: Post-menopausal  Other Topics Concern   Not on file  Social History Narrative  Works at [employer] Strain: Not on [employer] Insecurity: Not on file  Transportation Needs: No Transportation Needs   Lack of Transportation (Medical): No   Lack of Transportation (Non-Medical): No  Physical Activity: Not on file  Stress: Not on file  Social Connections: Not on file     Observations/Objective: Awake, alert and oriented x 3   ROS  Assessment and Plan: Diagnoses and all orders for this visit:  Essential hypertension Continue all antihypertensives as prescribed.  Remember to bring in your blood pressure log with you for your follow up  appointment.  DASH/Mediterranean Diets are healthier choices for HTN.    Controlled type 2 diabetes mellitus without complication, without long-term current use of insulin (HCC) Continue blood sugar control as discussed in office today, low carbohydrate diet, and regular physical exercise as tolerated, 150 minutes per week (30 min each day, 5 days per week, or 50 min 3 days per week). Keep blood sugar logs with fasting goal of 90-130 mg/dl, post prandial (after you eat) less than 180.  For Hypoglycemia: BS <60 and Hyperglycemia BS >400; contact the clinic ASAP. Annual eye exams and foot exams are recommended.   Dyslipidemia, goal LDL below 70 -     rosuvastatin (CRESTOR) 5 MG tablet; Take 1 tablet (5 mg total) by mouth daily. -     omega-3 acid ethyl esters (LOVAZA) 1 g capsule; TAKE 1 CAPSULE (1 G TOTAL) BY MOUTH 2 (TWO) TIMES DAILY.    Follow Up Instructions Return in about 3 months (around 08/06/2021).     I discussed the assessment and treatment plan with the patient. The patient was provided an opportunity to ask questions and all were answered. The patient agreed with the plan and demonstrated an understanding of the instructions.   The patient was advised to call back or seek an in-person evaluation if the symptoms worsen or if the condition fails to improve as anticipated.  I provided 15 minutes of non-face-to-face time during this encounter including median intraservice time, reviewing previous notes, labs, imaging, medications and explaining diagnosis and management.  [name], FNP-BC

## 2021-05-07 ENCOUNTER — Other Ambulatory Visit: Payer: Self-pay

## 2021-05-16 ENCOUNTER — Other Ambulatory Visit: Payer: Self-pay

## 2021-05-27 ENCOUNTER — Other Ambulatory Visit: Payer: Self-pay

## 2021-05-27 MED ORDER — TRUE METRIX BLOOD GLUCOSE TEST VI STRP
ORAL_STRIP | 12 refills | Status: DC
  Filled 2021-05-27: qty 100, 30d supply, fill #0

## 2021-06-17 ENCOUNTER — Other Ambulatory Visit: Payer: Self-pay

## 2021-06-17 MED ORDER — LATANOPROST 0.005 % OP SOLN
OPHTHALMIC | 3 refills | Status: DC
  Filled 2021-06-17: qty 5, 40d supply, fill #0
  Filled 2021-07-18: qty 5, 40d supply, fill #1
  Filled 2021-08-20: qty 2.5, 20d supply, fill #2
  Filled 2021-09-09: qty 2.5, 20d supply, fill #3
  Filled 2021-09-30: qty 2.5, 20d supply, fill #4

## 2021-07-02 ENCOUNTER — Other Ambulatory Visit: Payer: Self-pay

## 2021-07-18 ENCOUNTER — Other Ambulatory Visit: Payer: Self-pay

## 2021-07-18 ENCOUNTER — Other Ambulatory Visit: Payer: Self-pay | Admitting: Nurse Practitioner

## 2021-07-18 DIAGNOSIS — E119 Type 2 diabetes mellitus without complications: Secondary | ICD-10-CM

## 2021-07-18 MED ORDER — TRUE METRIX BLOOD GLUCOSE TEST VI STRP
ORAL_STRIP | 12 refills | Status: DC
  Filled 2021-07-18: qty 100, 25d supply, fill #0
  Filled 2021-09-09: qty 100, 25d supply, fill #1

## 2021-07-18 MED ORDER — METFORMIN HCL 500 MG PO TABS
ORAL_TABLET | Freq: Every day | ORAL | 0 refills | Status: DC
  Filled 2021-07-18: qty 30, 30d supply, fill #0
  Filled 2021-08-20: qty 30, 30d supply, fill #1
  Filled 2021-09-17: qty 30, 30d supply, fill #2

## 2021-07-19 ENCOUNTER — Other Ambulatory Visit: Payer: Self-pay

## 2021-08-05 ENCOUNTER — Other Ambulatory Visit: Payer: Self-pay

## 2021-08-05 ENCOUNTER — Encounter: Payer: Self-pay | Admitting: Nurse Practitioner

## 2021-08-05 ENCOUNTER — Ambulatory Visit: Payer: Self-pay | Attending: Nurse Practitioner | Admitting: Nurse Practitioner

## 2021-08-05 ENCOUNTER — Other Ambulatory Visit: Payer: Self-pay | Admitting: Nurse Practitioner

## 2021-08-05 VITALS — BP 131/87 | HR 82 | Ht 62.0 in | Wt 150.4 lb

## 2021-08-05 DIAGNOSIS — I10 Essential (primary) hypertension: Secondary | ICD-10-CM

## 2021-08-05 DIAGNOSIS — G8929 Other chronic pain: Secondary | ICD-10-CM

## 2021-08-05 DIAGNOSIS — E119 Type 2 diabetes mellitus without complications: Secondary | ICD-10-CM

## 2021-08-05 DIAGNOSIS — M545 Low back pain, unspecified: Secondary | ICD-10-CM

## 2021-08-05 DIAGNOSIS — Z130 Encounter for screening for diseases of the blood and blood-forming organs and certain disorders involving the immune mechanism: Secondary | ICD-10-CM

## 2021-08-05 LAB — POCT GLYCOSYLATED HEMOGLOBIN (HGB A1C): Hemoglobin A1C: 6.5 % — AB (ref 4.0–5.6)

## 2021-08-05 LAB — GLUCOSE, POCT (MANUAL RESULT ENTRY): POC Glucose: 127 mg/dl — AB (ref 70–99)

## 2021-08-05 MED ORDER — MELOXICAM 7.5 MG PO TABS
7.5000 mg | ORAL_TABLET | Freq: Every day | ORAL | 1 refills | Status: AC
  Filled 2021-08-05 – 2022-04-01 (×2): qty 30, 30d supply, fill #0

## 2021-08-05 NOTE — Progress Notes (Signed)
Assessment & Plan:  Mallory Wall was seen today for diabetes.  Diagnoses and all orders for this visit:  Controlled type 2 diabetes mellitus without complication, without long-term current use of insulin (HCC) -     POCT glucose (manual entry) -     POCT glycosylated hemoglobin (Hb A1C) Continue blood sugar control as discussed in office today, low carbohydrate diet, and regular physical exercise as tolerated, 150 minutes per week (30 min each day, 5 days per week, or 50 min 3 days per week). Keep blood sugar logs with fasting goal of 90-130 mg/dl, post prandial (after you eat) less than 180.  For Hypoglycemia: BS <60 and Hyperglycemia BS >400; contact the clinic ASAP. Annual eye exams and foot exams are recommended.   Essential hypertension Continue all antihypertensives as prescribed.  Remember to bring in your blood pressure log with you for your follow up appointment.  DASH/Mediterranean Diets are healthier choices for HTN.    Screening for deficiency anemia -     CBC  Chronic bilateral low back pain without sciatica -     meloxicam (MOBIC) 7.5 MG tablet; Take 1 tablet (7.5 mg total) by mouth daily. NEEDS PASS Work on losing weight to help reduce back pain. May alternate with heat and ice application for pain relief. May also alternate with acetaminophen as prescribed for back pain. Other alternatives include massage, acupuncture and water aerobics.  You must stay active and avoid a sedentary lifestyle.     Patient has been counseled on age-appropriate routine health concerns for screening and prevention. These are reviewed and up-to-date. Referrals have been placed accordingly. Immunizations are up-to-date or declined.    Subjective:   Chief Complaint  Patient presents with   Diabetes   HPI Mallory Wall 60 y.o. female presents to office today for follow up to DM and HTN She has a past medical history of Allergy, Back pain, Diabetes mellitus without complication ([place]),  Gallstones, Headache(784.0), Hyperlipidemia, and Vaginal Pap smear, abnormal.   Recently diagnosed with glaucoma.  Prescribed nightly Xalatan eyedrops at this time  DM 2 Well controlled with metformin 500 mg daily. She denies any symptoms of hypo or hyperglycemia.LDL at goal with Crestor 5 mg daily. Denies statin intolerance or myalgias.  Lab Results  Component Value Date   HGBA1C 6.5 (A) 08/05/2021    Lab Results  Component Value Date   HGBA1C 6.9 02/04/2021    Lab Results  Component Value Date   LDLCALC 68 05/08/2020      Hypertension Blood pressure is well controlled with lisinopril 20 mg daily. Denies chest pain, shortness of breath, palpitations, lightheadedness, dizziness, headaches or BLE edema.    BP Readings from Last 3 Encounters:  08/05/21 131/87  02/22/21 136/77  02/19/21 115/80     Endorses increased stress with nephew and 9 year old cousin not doing well. She is taking care of her as well.  Review of Systems  Constitutional:  Negative for fever, malaise/fatigue and weight loss.  HENT: Negative.  Negative for nosebleeds.   Eyes: Negative.  Negative for blurred vision, double vision and photophobia.  Respiratory: Negative.  Negative for cough and shortness of breath.   Cardiovascular:  Positive for leg swelling (intermittent). Negative for chest pain and palpitations.  Gastrointestinal: Negative.  Negative for heartburn, nausea and vomiting.  Musculoskeletal:  Positive for back pain (chronic) and joint pain. Negative for myalgias.  Neurological:  Positive for sensory change (left wrist numbness). Negative for dizziness, focal weakness, seizures and headaches.  Psychiatric/Behavioral: Negative.  Negative for suicidal ideas.    Past Medical History:  Diagnosis Date   Allergy    Back pain    Diabetes mellitus without complication ([place])    Gallstones    Headache(784.0)    Hyperlipidemia    Vaginal Pap smear, abnormal     Past Surgical History:  Procedure  Laterality Date   CHOLECYSTECTOMY N/A 01/13/2018   Procedure: LAPAROSCOPIC CHOLECYSTECTOMY;  Surgeon: [name], MD;  Location: [facility];  Service: General;  Laterality: N/A;   NO PAST SURGERIES      Family History  Problem Relation Age of Onset   CAD Mother    Hypertension Mother    Diabetes type II Sister    Lung disease Sister    Colon cancer Neg Hx    Esophageal cancer Neg Hx    Pancreatic cancer Neg Hx    Rectal cancer Neg Hx    Stomach cancer Neg Hx     Social History Reviewed with no changes to be made today.   Outpatient Medications Prior to Visit  Medication Sig Dispense Refill   Accu-Chek Softclix Lancets lancets Use to check blood sugar once daily. 100 each 2   acetaminophen (TYLENOL) 325 MG tablet Take 650 mg by mouth every 6 (six) hours as needed.     Blood Glucose Monitoring Suppl (ACCU-CHEK GUIDE ME) w/Device KIT Use to check blood sugar once daily. E11.9 1 kit 0   famotidine (PEPCID) 20 MG tablet TAKE 1 TABLET (20 MG TOTAL) BY MOUTH DAILY. 90 tablet 2   glucose blood (TRUE METRIX BLOOD GLUCOSE TEST) test strip use as directed 100 each 12   latanoprost (XALATAN) 0.005 % ophthalmic solution Place 1 drop into both eyes nightly. 5 mL 3   lidocaine (LIDODERM) 5 % Place 1 patch onto the skin daily. NEEDS PASS 30 patch 0   lisinopril (ZESTRIL) 20 MG tablet Take 1 tablet (20 mg total) by mouth daily. 90 tablet 3   metFORMIN (GLUCOPHAGE) 500 MG tablet TAKE 1 TABLET (500 MG TOTAL) BY MOUTH DAILY WITH BREAKFAST. 90 tablet 0   methocarbamol (ROBAXIN) 500 MG tablet Take 1 tablet (500 mg total) by mouth 4 (four) times daily. NEEDS PASS 90 tablet 1   nicotine (NICODERM CQ - DOSED IN MG/24 HOURS) 14 mg/24hr patch PLACE 1 PATCH (14 MG TOTAL) ONTO THE SKIN DAILY FOR 28 DAYS. 28 patch 0   omega-3 acid ethyl esters (LOVAZA) 1 g capsule TAKE 1 CAPSULE (1 G TOTAL) BY MOUTH 2 (TWO) TIMES DAILY. 180 capsule 3   rosuvastatin (CRESTOR) 5 MG tablet Take 1 tablet (5 mg  total) by mouth daily. 90 tablet 2   TRUEplus Lancets 28G MISC USE TO CHECK BLOOD SUGAR ONCE DAILY. 100 each 2   meloxicam (MOBIC) 7.5 MG tablet Take 1 tablet (7.5 mg total) by mouth daily. NEEDS PASS 30 tablet 0   No facility-administered medications prior to visit.    No Known Allergies     Objective:    BP 131/87   Pulse 82   Ht $R'5\' 2"'gz$  (1.575 m)   Wt 150 lb 6 oz (68.2 kg)   SpO2 98%   BMI 27.50 kg/m  Wt Readings from Last 3 Encounters:  08/05/21 150 lb 6 oz (68.2 kg)  02/22/21 154 lb 1.6 oz (69.9 kg)  02/04/21 154 lb 6.4 oz (70 kg)    Physical Exam Vitals and nursing note reviewed.  Constitutional:      Appearance: She is  well-developed.  HENT:     Head: Normocephalic and atraumatic.  Cardiovascular:     Rate and Rhythm: Normal rate and regular rhythm.     Heart sounds: Normal heart sounds. No murmur heard.   No friction rub. No gallop.  Pulmonary:     Effort: Pulmonary effort is normal. No tachypnea or respiratory distress.     Breath sounds: Normal breath sounds. No decreased breath sounds, wheezing, rhonchi or rales.  Chest:     Chest wall: No tenderness.  Abdominal:     General: Bowel sounds are normal.     Palpations: Abdomen is soft.  Musculoskeletal:        General: Normal range of motion.     Cervical back: Normal range of motion.  Skin:    General: Skin is warm and dry.  Neurological:     Mental Status: She is alert and oriented to person, place, and time.     Coordination: Coordination normal.  Psychiatric:        Behavior: Behavior normal. Behavior is cooperative.        Thought Content: Thought content normal.        Judgment: Judgment normal.         Patient has been counseled extensively about nutrition and exercise as well as the importance of adherence with medications and regular follow-up. The patient was given clear instructions to go to ER or return to medical center if symptoms don't improve, worsen or new problems develop. The  patient verbalized understanding.   Follow-up: Return in about 3 months (around 11/05/2021).   [name], FNP-BC [facility] [place], [place]   08/05/2021, 12:24 PM

## 2021-08-06 LAB — CMP14+EGFR
ALT: 10 IU/L (ref 0–32)
AST: 14 IU/L (ref 0–40)
Albumin/Globulin Ratio: 1.5 (ref 1.2–2.2)
Albumin: 4.5 g/dL (ref 3.8–4.9)
Alkaline Phosphatase: 76 IU/L (ref 44–121)
BUN/Creatinine Ratio: 14 (ref 9–23)
BUN: 9 mg/dL (ref 6–24)
Bilirubin Total: 0.3 mg/dL (ref 0.0–1.2)
CO2: 25 mmol/L (ref 20–29)
Calcium: 9.5 mg/dL (ref 8.7–10.2)
Chloride: 103 mmol/L (ref 96–106)
Creatinine, Ser: 0.64 mg/dL (ref 0.57–1.00)
Globulin, Total: 3 g/dL (ref 1.5–4.5)
Glucose: 121 mg/dL — ABNORMAL HIGH (ref 70–99)
Potassium: 4.1 mmol/L (ref 3.5–5.2)
Sodium: 141 mmol/L (ref 134–144)
Total Protein: 7.5 g/dL (ref 6.0–8.5)
eGFR: 102 mL/min/{1.73_m2} (ref >59)

## 2021-08-06 LAB — CBC
Hematocrit: 40.1 % (ref 34.0–46.6)
Hemoglobin: 12.8 g/dL (ref 11.1–15.9)
MCH: 29.4 pg (ref 26.6–33.0)
MCHC: 31.9 g/dL (ref 31.5–35.7)
MCV: 92 fL (ref 79–97)
Platelets: 333 10*3/uL (ref 150–450)
RBC: 4.35 x10E6/uL (ref 3.77–5.28)
RDW: 13.3 % (ref 11.7–15.4)
WBC: 5.9 10*3/uL (ref 3.4–10.8)

## 2021-08-16 ENCOUNTER — Ambulatory Visit: Payer: Self-pay | Attending: Nurse Practitioner

## 2021-08-16 ENCOUNTER — Other Ambulatory Visit: Payer: Self-pay

## 2021-08-20 ENCOUNTER — Other Ambulatory Visit: Payer: Self-pay

## 2021-09-09 ENCOUNTER — Other Ambulatory Visit: Payer: Self-pay

## 2021-09-17 ENCOUNTER — Other Ambulatory Visit: Payer: Self-pay

## 2021-09-18 ENCOUNTER — Other Ambulatory Visit: Payer: Self-pay

## 2021-09-30 ENCOUNTER — Other Ambulatory Visit: Payer: Self-pay

## 2021-10-01 ENCOUNTER — Other Ambulatory Visit: Payer: Self-pay

## 2021-10-16 ENCOUNTER — Other Ambulatory Visit: Payer: Self-pay

## 2021-10-16 MED FILL — Famotidine Tab 20 MG: ORAL | 30 days supply | Qty: 30 | Fill #0 | Status: AC

## 2021-10-17 ENCOUNTER — Other Ambulatory Visit: Payer: Self-pay | Admitting: Nurse Practitioner

## 2021-10-17 ENCOUNTER — Other Ambulatory Visit: Payer: Self-pay

## 2021-10-17 DIAGNOSIS — E119 Type 2 diabetes mellitus without complications: Secondary | ICD-10-CM

## 2021-10-17 MED ORDER — METFORMIN HCL 500 MG PO TABS
ORAL_TABLET | Freq: Every day | ORAL | 0 refills | Status: DC
  Filled 2021-10-17 – 2021-11-19 (×2): qty 30, 30d supply, fill #0
  Filled 2021-11-19 – 2021-12-18 (×2): qty 30, 30d supply, fill #1

## 2021-10-18 ENCOUNTER — Other Ambulatory Visit: Payer: Self-pay

## 2021-11-06 ENCOUNTER — Other Ambulatory Visit: Payer: Self-pay

## 2021-11-06 ENCOUNTER — Encounter: Payer: Self-pay | Admitting: Nurse Practitioner

## 2021-11-06 ENCOUNTER — Ambulatory Visit: Payer: Self-pay | Attending: Nurse Practitioner | Admitting: Nurse Practitioner

## 2021-11-06 DIAGNOSIS — R413 Other amnesia: Secondary | ICD-10-CM

## 2021-11-06 DIAGNOSIS — H401131 Primary open-angle glaucoma, bilateral, mild stage: Secondary | ICD-10-CM

## 2021-11-06 DIAGNOSIS — E119 Type 2 diabetes mellitus without complications: Secondary | ICD-10-CM

## 2021-11-06 DIAGNOSIS — R42 Dizziness and giddiness: Secondary | ICD-10-CM

## 2021-11-06 MED ORDER — MECLIZINE HCL 12.5 MG PO TABS
12.5000 mg | ORAL_TABLET | Freq: Three times a day (TID) | ORAL | 0 refills | Status: DC | PRN
Start: 2021-11-06 — End: 2022-05-23
  Filled 2021-11-06: qty 30, 10d supply, fill #0

## 2021-11-06 MED ORDER — LATANOPROST 0.005 % OP SOLN
OPHTHALMIC | 3 refills | Status: DC
  Filled 2021-11-06: qty 5, 37d supply, fill #0
  Filled 2021-12-30: qty 5, 37d supply, fill #1

## 2021-11-06 MED ORDER — TRUE METRIX BLOOD GLUCOSE TEST VI STRP
ORAL_STRIP | 12 refills | Status: DC
  Filled 2021-11-06: qty 100, 25d supply, fill #0

## 2021-11-06 NOTE — Progress Notes (Signed)
Virtual Visit via Telephone Note Due to national recommendations of social distancing due to [place] 19, telehealth visit is felt to be most appropriate for this patient at this time.  I discussed the limitations, risks, security and privacy concerns of performing an evaluation and management service by telephone and the availability of in person appointments. I also discussed with the patient that there may be a patient responsible charge related to this service. The patient expressed understanding and agreed to proceed.    I connected with Mallory Wall on 11/06/21  at  11:10 AM EST  EDT by telephone and verified that I am speaking with the correct person using two identifiers.  Location of Patient: Private Residence   Location of Provider: [facility] [employer] Office    Persons participating in Telemedicine visit: [name] FNP-BC Mallory Wall    History of Present Illness: Telemedicine visit for: lightheadedness and memory loss She has a past medical history of Allergy, Back pain, Diabetes mellitus without complication ([place]), Gallstones, Headache(784.0), Hyperlipidemia, and Vaginal Pap smear, abnormal.   Short term memory loss Sometimes forgets where she puts things. Does not forget appointments, important dates, or names of friends and family members. She does endorse increased stressors in personal life.   Dizziness She reports new onset dizziness. She describes it as feeling light headed, occurs intermittently, and typically lasts days.  It typically occurs when she is standing. It is usually relieved by  nothing: resolves on its onw . She has not started new medications around the time the dizziness started. Associated symptoms: No hearing loss No tinnitus  No chest discomfort No heart palpitations  No heart racing No numbness or tingling of extremities  No nausea No vomiting  No speech difficulty No visual changes    Wt Readings from Last 3  Encounters:  08/05/21 150 lb 6 oz (68.2 kg)  02/22/21 154 lb 1.6 oz (69.9 kg)  02/04/21 154 lb 6.4 oz (70 kg)    BP Readings from Last 3 Encounters:  08/05/21 131/87  02/22/21 136/77  02/19/21 115/80      Lab Results  Component Value Date   WBC 5.9 08/05/2021   HGB 12.8 08/05/2021   HCT 40.1 08/05/2021   MCV 92 08/05/2021   PLT 333 08/05/2021   Lab Results  Component Value Date   NA 141 08/05/2021   K 4.1 08/05/2021   CO2 25 08/05/2021   BUN 9 08/05/2021   CREATININE 0.64 08/05/2021   CALCIUM 9.5 08/05/2021   GLUCOSE 121 (H) 08/05/2021     ---------------------------------------------------------------------------------------------------     Past Medical History:  Diagnosis Date   Allergy    Back pain    Diabetes mellitus without complication (HCC)    Gallstones    Headache(784.0)    Hyperlipidemia    Vaginal Pap smear, abnormal     Past Surgical History:  Procedure Laterality Date   CHOLECYSTECTOMY N/A 01/13/2018   Procedure: LAPAROSCOPIC CHOLECYSTECTOMY;  Surgeon: [name], MD;  Location: [facility];  Service: General;  Laterality: N/A;   NO PAST SURGERIES      Family History  Problem Relation Age of Onset   CAD Mother    Hypertension Mother    Diabetes type II Sister    Lung disease Sister    Colon cancer Neg Hx    Esophageal cancer Neg Hx    Pancreatic cancer Neg Hx    Rectal cancer Neg Hx    Stomach cancer Neg Hx  Social History   Socioeconomic History   Marital status: Single    Spouse name: Not on file   Number of children: 0   Years of education: Not on file   Highest education level: High school graduate  Occupational History   Not on file  Tobacco Use   Smoking status: Every Day    Packs/day: 1.00    Types: Cigarettes   Smokeless tobacco: Never  Vaping Use   Vaping Use: Never used  Substance and Sexual Activity   Alcohol use: No   Drug use: No   Sexual activity: Not Currently    Birth  control/protection: Post-menopausal  Other Topics Concern   Not on file  Social History Narrative   Works at [employer] Strain: Not on file  Food Insecurity: Not on file  Transportation Needs: No Transportation Needs   Lack of Transportation (Medical): No   Lack of Transportation (Non-Medical): No  Physical Activity: Not on file  Stress: Not on file  Social Connections: Not on file     Observations/Objective: Awake, alert and oriented x 3   Review of Systems  Constitutional:  Negative for fever, malaise/fatigue and weight loss.  HENT: Negative.  Negative for nosebleeds.   Eyes: Negative.  Negative for blurred vision, double vision and photophobia.  Respiratory: Negative.  Negative for cough and shortness of breath.   Cardiovascular: Negative.  Negative for chest pain, palpitations and leg swelling.  Gastrointestinal: Negative.  Negative for heartburn, nausea and vomiting.  Musculoskeletal: Negative.  Negative for myalgias.  Neurological:  Positive for dizziness. Negative for focal weakness, seizures and headaches.  Psychiatric/Behavioral:  Positive for memory loss. Negative for suicidal ideas.    Assessment and Plan: Diagnoses and all orders for this visit:  Dizziness -     meclizine (ANTIVERT) 12.5 MG tablet; Take 1 tablet (12.5 mg total) by mouth 3 (three) times daily as needed for dizziness.  Memory loss, short term  Diabetes mellitus without complication (HCC) -     glucose blood (TRUE METRIX BLOOD GLUCOSE TEST) test strip; use as directed  Primary open angle glaucoma (POAG) of both eyes, mild stage -     latanoprost (XALATAN) 0.005 % ophthalmic solution; Place 1 drop into both eyes nightly.     Follow Up Instructions Return in about 3 months (around 02/04/2022).     I discussed the assessment and treatment plan with the patient. The patient was provided an opportunity to ask questions and all were  answered. The patient agreed with the plan and demonstrated an understanding of the instructions.   The patient was advised to call back or seek an in-person evaluation if the symptoms worsen or if the condition fails to improve as anticipated.  I provided 14 minutes of non-face-to-face time during this encounter including median intraservice time, reviewing previous notes, labs, imaging, medications and explaining diagnosis and management.  [name], FNP-BC

## 2021-11-19 ENCOUNTER — Other Ambulatory Visit: Payer: Self-pay

## 2021-11-20 ENCOUNTER — Other Ambulatory Visit: Payer: Self-pay

## 2021-11-27 ENCOUNTER — Other Ambulatory Visit: Payer: Self-pay

## 2021-11-27 DIAGNOSIS — Z1231 Encounter for screening mammogram for malignant neoplasm of breast: Secondary | ICD-10-CM

## 2021-11-28 ENCOUNTER — Other Ambulatory Visit: Payer: Self-pay

## 2021-12-18 ENCOUNTER — Other Ambulatory Visit: Payer: Self-pay

## 2021-12-19 ENCOUNTER — Other Ambulatory Visit: Payer: Self-pay

## 2021-12-30 ENCOUNTER — Other Ambulatory Visit: Payer: Self-pay

## 2022-01-13 ENCOUNTER — Other Ambulatory Visit: Payer: Self-pay | Admitting: Pharmacist

## 2022-01-13 ENCOUNTER — Encounter: Payer: Self-pay | Admitting: Nurse Practitioner

## 2022-01-13 ENCOUNTER — Ambulatory Visit: Payer: 59 | Attending: Nurse Practitioner | Admitting: Nurse Practitioner

## 2022-01-13 ENCOUNTER — Other Ambulatory Visit: Payer: Self-pay

## 2022-01-13 VITALS — BP 130/88 | HR 75 | Ht 62.0 in | Wt 154.0 lb

## 2022-01-13 DIAGNOSIS — G8929 Other chronic pain: Secondary | ICD-10-CM

## 2022-01-13 DIAGNOSIS — H401131 Primary open-angle glaucoma, bilateral, mild stage: Secondary | ICD-10-CM

## 2022-01-13 DIAGNOSIS — E785 Hyperlipidemia, unspecified: Secondary | ICD-10-CM

## 2022-01-13 DIAGNOSIS — M545 Low back pain, unspecified: Secondary | ICD-10-CM

## 2022-01-13 DIAGNOSIS — I10 Essential (primary) hypertension: Secondary | ICD-10-CM

## 2022-01-13 DIAGNOSIS — G629 Polyneuropathy, unspecified: Secondary | ICD-10-CM

## 2022-01-13 DIAGNOSIS — K219 Gastro-esophageal reflux disease without esophagitis: Secondary | ICD-10-CM

## 2022-01-13 DIAGNOSIS — E119 Type 2 diabetes mellitus without complications: Secondary | ICD-10-CM

## 2022-01-13 LAB — POCT GLYCOSYLATED HEMOGLOBIN (HGB A1C): Hemoglobin A1C: 7 % — AB (ref 4.0–5.6)

## 2022-01-13 LAB — GLUCOSE, POCT (MANUAL RESULT ENTRY): POC Glucose: 117 mg/dl — AB (ref 70–99)

## 2022-01-13 MED ORDER — ACCU-CHEK GUIDE VI STRP
ORAL_STRIP | 2 refills | Status: DC
  Filled 2022-01-13: qty 100, 100d supply, fill #0

## 2022-01-13 MED ORDER — ROSUVASTATIN CALCIUM 5 MG PO TABS
5.0000 mg | ORAL_TABLET | Freq: Every day | ORAL | 2 refills | Status: DC
Start: 2022-01-13 — End: 2022-11-17
  Filled 2022-01-13 – 2022-01-28 (×2): qty 90, 90d supply, fill #0
  Filled 2022-04-14: qty 90, 90d supply, fill #1
  Filled 2022-09-21: qty 30, 30d supply, fill #2
  Filled 2022-10-19: qty 30, 30d supply, fill #3

## 2022-01-13 MED ORDER — OMEGA-3-ACID ETHYL ESTERS 1 G PO CAPS
ORAL_CAPSULE | ORAL | 3 refills | Status: DC
  Filled 2022-01-13: qty 180, fill #0
  Filled 2022-01-28: qty 180, 90d supply, fill #0

## 2022-01-13 MED ORDER — MICROLET LANCETS MISC
2 refills | Status: DC
  Filled 2022-01-13: qty 100, 100d supply, fill #0

## 2022-01-13 MED ORDER — CONTOUR NEXT TEST VI STRP
ORAL_STRIP | 2 refills | Status: DC
  Filled 2022-01-13: qty 25, 25d supply, fill #0
  Filled 2022-01-28 – 2022-02-18 (×2): qty 25, 25d supply, fill #1

## 2022-01-13 MED ORDER — TRUE METRIX BLOOD GLUCOSE TEST VI STRP
ORAL_STRIP | 12 refills | Status: DC
  Filled 2022-01-13: qty 100, 100d supply, fill #0

## 2022-01-13 MED ORDER — FAMOTIDINE 20 MG PO TABS
ORAL_TABLET | Freq: Every day | ORAL | 2 refills | Status: DC
Start: 2022-01-13 — End: 2023-02-16
  Filled 2022-01-13: qty 90, fill #0
  Filled 2022-03-12: qty 90, 90d supply, fill #0
  Filled 2022-12-29: qty 30, 30d supply, fill #1

## 2022-01-13 MED ORDER — LISINOPRIL 20 MG PO TABS
20.0000 mg | ORAL_TABLET | Freq: Every day | ORAL | 3 refills | Status: DC
  Filled 2022-01-13: qty 90, 90d supply, fill #0
  Filled 2022-04-14: qty 90, 90d supply, fill #1
  Filled 2022-07-21: qty 30, 30d supply, fill #2

## 2022-01-13 MED ORDER — LIDOCAINE 5 % EX PTCH
1.0000 | MEDICATED_PATCH | CUTANEOUS | 0 refills | Status: DC
  Filled 2022-01-13: qty 30, 30d supply, fill #0

## 2022-01-13 MED ORDER — ACCU-CHEK SOFTCLIX LANCETS MISC
2 refills | Status: DC
Start: 2022-01-13 — End: 2022-01-13
  Filled 2022-01-13: qty 100, 100d supply, fill #0

## 2022-01-13 MED ORDER — METFORMIN HCL 500 MG PO TABS
500.0000 mg | ORAL_TABLET | Freq: Every day | ORAL | 1 refills | Status: DC
  Filled 2022-01-13: qty 90, 90d supply, fill #0
  Filled 2022-04-14: qty 90, 90d supply, fill #1

## 2022-01-13 MED ORDER — CONTOUR NEXT MONITOR W/DEVICE KIT
PACK | 0 refills | Status: DC
  Filled 2022-01-13: qty 1, 1d supply, fill #0

## 2022-01-13 MED ORDER — METHOCARBAMOL 500 MG PO TABS
500.0000 mg | ORAL_TABLET | Freq: Four times a day (QID) | ORAL | 1 refills | Status: DC
  Filled 2022-01-13: qty 90, 23d supply, fill #0

## 2022-01-13 MED ORDER — LATANOPROST 0.005 % OP SOLN
OPHTHALMIC | 3 refills | Status: DC
  Filled 2022-01-13: qty 5, fill #0
  Filled 2022-01-28: qty 2.5, 30d supply, fill #0
  Filled 2022-02-17 – 2022-02-18 (×2): qty 2.5, 30d supply, fill #1
  Filled 2022-03-12: qty 2.5, 30d supply, fill #2
  Filled 2022-03-31 – 2022-04-02 (×2): qty 2.5, 30d supply, fill #3
  Filled 2022-05-05: qty 2.5, 30d supply, fill #4
  Filled 2022-05-28: qty 2.5, 30d supply, fill #5
  Filled 2022-06-14 – 2022-06-18 (×2): qty 2.5, 30d supply, fill #6
  Filled 2022-07-21: qty 2.5, 25d supply, fill #7

## 2022-01-13 NOTE — Progress Notes (Signed)
? ?Assessment & Plan:  ?[name] was seen today for hypertension and diabetes. ? ?Diagnoses and all orders for this visit: ? ?Controlled type 2 diabetes mellitus without complication, without long-term current use of insulin (HCC) ?-     POCT glycosylated hemoglobin (Hb A1C) ?-     POCT glucose (manual entry) ?-     Discontinue: Accu-Chek Softclix Lancets lancets; Use to check blood sugar once daily. ?-     Discontinue: glucose blood (TRUE METRIX BLOOD GLUCOSE TEST) test strip; use as directed ?-     metFORMIN (GLUCOPHAGE) 500 MG tablet; Take 1 tablet (500 mg total) by mouth daily with breakfast. ?-     CMP14+EGFR ? ?Essential hypertension ?-     lisinopril (ZESTRIL) 20 MG tablet; Take 1 tablet (20 mg total) by mouth daily. ?-     CMP14+EGFR ?Continue all antihypertensives as prescribed.  ?Remember to bring in your blood pressure log with you for your follow up appointment.  ?DASH/Mediterranean Diets are healthier choices for HTN.   ? ?Neuropathy ?-     Iron, TIBC and Ferritin Panel ? ?GERD without esophagitis ?-     famotidine (PEPCID) 20 MG tablet; TAKE 1 TABLET (20 MG TOTAL) BY MOUTH DAILY. ? ?Primary open angle glaucoma (POAG) of both eyes, mild stage ?-     latanoprost (XALATAN) 0.005 % ophthalmic solution; Place 1 drop into both eyes nightly. ? ?Chronic bilateral low back pain without sciatica ?-     lidocaine (LIDODERM) 5 %; Place 1 patch onto the skin daily. NEEDS PASS ?-     methocarbamol (ROBAXIN) 500 MG tablet; Take 1 tablet (500 mg total) by mouth 4 (four) times daily. NEEDS PASS ? ?Dyslipidemia, goal LDL below 70 ?-     omega-3 acid ethyl esters (LOVAZA) 1 g capsule; TAKE 1 CAPSULE (1 G TOTAL) BY MOUTH 2 (TWO) TIMES DAILY. ?-     rosuvastatin (CRESTOR) 5 MG tablet; Take 1 tablet (5 mg total) by mouth daily. ?-     Lipid panel ? ? ? ?Patient has been counseled on age-appropriate routine health concerns for screening and prevention. These are reviewed and up-to-date. Referrals have been placed accordingly.  Immunizations are up-to-date or declined.    ?Subjective:  ? ?Chief Complaint  ?Patient presents with  ? Hypertension  ? Diabetes  ? ?HPI ?Mallory Wall 61 y.o. female presents to office today for follow up to DM and HTN.  ? ?HTN ?BP is suboptimal. She is taking lisinopril 20 mg daily as prescribed.  ?BP Readings from Last 3 Encounters:  ?01/13/22 130/88  ?08/05/21 131/87  ?02/22/21 136/77  ?  ?Reports Right big toe numbness. Started several weeks ago after wearing tight shoes while at work standing for several hours. Initially the entire top of the foot was numb however that has resolved and now only one toe continues with neuropathy. On exam today there are no obvious signs of blood flow compromise. The toes and foot are warm to touch, normal capillary refill time and no discoloration.  ? ?DM ?Well controlled with metformin 500 mg daily. LDL at goal with crestor and lovaza.  ?Lab Results  ?Component Value Date  ? HGBA1C 7.0 (A) 01/13/2022  ?  ?Lab Results  ?Component Value Date  ? [place] 68 05/08/2020  ?  ? ?Review of Systems  ?Constitutional:  Negative for fever, malaise/fatigue and weight loss.  ?HENT: Negative.  Negative for nosebleeds.   ?Eyes: Negative.  Negative for blurred vision, double vision and photophobia.  ?  Respiratory: Negative.  Negative for cough and shortness of breath.   ?Cardiovascular: Negative.  Negative for chest pain, palpitations and leg swelling.  ?Gastrointestinal:  Positive for heartburn (well controlled with H2 antagonist). Negative for nausea and vomiting.  ?Musculoskeletal:  Positive for back pain (chronic and controlled). Negative for myalgias.  ?Neurological:  Positive for sensory change (right great toe). Negative for dizziness, focal weakness, seizures and headaches.  ?Psychiatric/Behavioral: Negative.  Negative for suicidal ideas.   ? ?Past Medical History:  ?Diagnosis Date  ? Allergy   ? Back pain   ? Diabetes mellitus without complication ([place])   ? Gallstones   ?  Headache(784.0)   ? Hyperlipidemia   ? Vaginal Pap smear, abnormal   ? ? ?Past Surgical History:  ?Procedure Laterality Date  ? CHOLECYSTECTOMY N/A 01/13/2018  ? Procedure: LAPAROSCOPIC CHOLECYSTECTOMY;  Surgeon: [name], MD;  Location: [facility];  Service: General;  Laterality: N/A;  ? NO PAST SURGERIES    ? ? ?Family History  ?Problem Relation Age of Onset  ? CAD Mother   ? Hypertension Mother   ? Diabetes type II Sister   ? Lung disease Sister   ? Colon cancer Neg Hx   ? Esophageal cancer Neg Hx   ? Pancreatic cancer Neg Hx   ? Rectal cancer Neg Hx   ? Stomach cancer Neg Hx   ? ? ?Social History Reviewed with no changes to be made today.  ? ?Outpatient Medications Prior to Visit  ?Medication Sig Dispense Refill  ? acetaminophen (TYLENOL) 325 MG tablet Take 650 mg by mouth every 6 (six) hours as needed.    ? Accu-Chek Softclix Lancets lancets Use to check blood sugar once daily. 100 each 2  ? Blood Glucose Monitoring Suppl (ACCU-CHEK GUIDE ME) w/Device KIT Use to check blood sugar once daily. E11.9 1 kit 0  ? glucose blood (TRUE METRIX BLOOD GLUCOSE TEST) test strip use as directed 100 each 12  ? latanoprost (XALATAN) 0.005 % ophthalmic solution Place 1 drop into both eyes nightly. 5 mL 3  ? lidocaine (LIDODERM) 5 % Place 1 patch onto the skin daily. NEEDS PASS 30 patch 0  ? lisinopril (ZESTRIL) 20 MG tablet Take 1 tablet (20 mg total) by mouth daily. 90 tablet 3  ? metFORMIN (GLUCOPHAGE) 500 MG tablet TAKE 1 TABLET (500 MG TOTAL) BY MOUTH DAILY WITH BREAKFAST. 90 tablet 0  ? methocarbamol (ROBAXIN) 500 MG tablet Take 1 tablet (500 mg total) by mouth 4 (four) times daily. NEEDS PASS 90 tablet 1  ? omega-3 acid ethyl esters (LOVAZA) 1 g capsule TAKE 1 CAPSULE (1 G TOTAL) BY MOUTH 2 (TWO) TIMES DAILY. 180 capsule 3  ? rosuvastatin (CRESTOR) 5 MG tablet Take 1 tablet (5 mg total) by mouth daily. 90 tablet 2  ? meclizine (ANTIVERT) 12.5 MG tablet Take 1 tablet (12.5 mg total) by mouth 3  (three) times daily as needed for dizziness. (Patient not taking: Reported on 01/13/2022) 30 tablet 0  ? famotidine (PEPCID) 20 MG tablet TAKE 1 TABLET (20 MG TOTAL) BY MOUTH DAILY. 90 tablet 2  ? ?No facility-administered medications prior to visit.  ? ? ?No Known Allergies ? ?   ?Objective:  ?  ?BP 130/88 (BP Location: Left Arm, Patient Position: Sitting, Cuff Size: Normal)   Pulse 75   Ht $R'5\' 2"'tR$  (1.575 m)   Wt 154 lb (69.9 kg)   SpO2 100%   BMI 28.17 kg/m?  ?Wt Readings from Last 3  Encounters:  ?01/13/22 154 lb (69.9 kg)  ?08/05/21 150 lb 6 oz (68.2 kg)  ?02/22/21 154 lb 1.6 oz (69.9 kg)  ? ? ?Physical Exam ?Vitals and nursing note reviewed.  ?Constitutional:   ?   Appearance: She is well-developed.  ?HENT:  ?   Head: Normocephalic and atraumatic.  ?Cardiovascular:  ?   Rate and Rhythm: Normal rate and regular rhythm.  ?   Heart sounds: Normal heart sounds. No murmur heard. ?  No friction rub. No gallop.  ?Pulmonary:  ?   Effort: Pulmonary effort is normal. No tachypnea or respiratory distress.  ?   Breath sounds: Normal breath sounds. No decreased breath sounds, wheezing, rhonchi or rales.  ?Chest:  ?   Chest wall: No tenderness.  ?Abdominal:  ?   General: Bowel sounds are normal.  ?   Palpations: Abdomen is soft.  ?Musculoskeletal:     ?   General: Normal range of motion.  ?   Cervical back: Normal range of motion.  ?Skin: ?   General: Skin is warm and dry.  ?Neurological:  ?   Mental Status: She is alert and oriented to person, place, and time.  ?   Coordination: Coordination normal.  ?Psychiatric:     ?   Behavior: Behavior normal. Behavior is cooperative.     ?   Thought Content: Thought content normal.     ?   Judgment: Judgment normal.  ? ? ? ? ?   ?Patient has been counseled extensively about nutrition and exercise as well as the importance of adherence with medications and regular follow-up. The patient was given clear instructions to go to ER or return to medical center if symptoms don't improve,  worsen or new problems develop. The patient verbalized understanding.  ? ?Follow-up: Return for 4 weeks right great toe numbness. TELE VISIT ON A TUESDAY. OFFICE VISIT END OF JUNE.  ? ?[name], FNP-BC ?[place]

## 2022-01-14 LAB — IRON,TIBC AND FERRITIN PANEL
Ferritin: 64 ng/mL (ref 15–150)
Iron Saturation: 20 % (ref 15–55)
Iron: 76 ug/dL (ref 27–159)
Total Iron Binding Capacity: 371 ug/dL (ref 250–450)
UIBC: 295 ug/dL (ref 131–425)

## 2022-01-14 LAB — LIPID PANEL
Chol/HDL Ratio: 2.9 ratio (ref 0.0–4.4)
Cholesterol, Total: 126 mg/dL (ref 100–199)
HDL: 44 mg/dL (ref >39)
LDL Chol Calc (NIH): 71 mg/dL (ref 0–99)
Triglycerides: 46 mg/dL (ref 0–149)
VLDL Cholesterol Cal: 11 mg/dL (ref 5–40)

## 2022-01-14 LAB — CMP14+EGFR
ALT: 17 IU/L (ref 0–32)
AST: 20 IU/L (ref 0–40)
Albumin/Globulin Ratio: 1.5 (ref 1.2–2.2)
Albumin: 4.4 g/dL (ref 3.8–4.9)
Alkaline Phosphatase: 82 IU/L (ref 44–121)
BUN/Creatinine Ratio: 12 (ref 12–28)
BUN: 7 mg/dL — ABNORMAL LOW (ref 8–27)
Bilirubin Total: 0.2 mg/dL (ref 0.0–1.2)
CO2: 26 mmol/L (ref 20–29)
Calcium: 9.6 mg/dL (ref 8.7–10.3)
Chloride: 103 mmol/L (ref 96–106)
Creatinine, Ser: 0.59 mg/dL (ref 0.57–1.00)
Globulin, Total: 3 g/dL (ref 1.5–4.5)
Glucose: 124 mg/dL — ABNORMAL HIGH (ref 70–99)
Potassium: 4.3 mmol/L (ref 3.5–5.2)
Sodium: 142 mmol/L (ref 134–144)
Total Protein: 7.4 g/dL (ref 6.0–8.5)
eGFR: 103 mL/min/{1.73_m2} (ref >59)

## 2022-01-16 ENCOUNTER — Other Ambulatory Visit: Payer: Self-pay

## 2022-01-16 MED ORDER — BRIMONIDINE TARTRATE-TIMOLOL 0.2-0.5 % OP SOLN
OPHTHALMIC | 3 refills | Status: DC
  Filled 2022-01-16: qty 5, 15d supply, fill #0
  Filled 2022-01-28: qty 5, 15d supply, fill #1
  Filled 2022-02-17: qty 5, 15d supply, fill #2
  Filled 2022-03-12: qty 5, 15d supply, fill #3

## 2022-01-17 ENCOUNTER — Other Ambulatory Visit: Payer: Self-pay

## 2022-01-28 ENCOUNTER — Other Ambulatory Visit: Payer: Self-pay

## 2022-01-29 ENCOUNTER — Other Ambulatory Visit: Payer: Self-pay

## 2022-02-11 ENCOUNTER — Telehealth (HOSPITAL_BASED_OUTPATIENT_CLINIC_OR_DEPARTMENT_OTHER): Payer: 59 | Admitting: Nurse Practitioner

## 2022-02-11 ENCOUNTER — Encounter: Payer: Self-pay | Admitting: Nurse Practitioner

## 2022-02-11 ENCOUNTER — Other Ambulatory Visit: Payer: Self-pay | Admitting: Nurse Practitioner

## 2022-02-11 DIAGNOSIS — E119 Type 2 diabetes mellitus without complications: Secondary | ICD-10-CM

## 2022-02-11 DIAGNOSIS — G629 Polyneuropathy, unspecified: Secondary | ICD-10-CM | POA: Diagnosis not present

## 2022-02-11 NOTE — Progress Notes (Signed)
Virtual Visit via Telephone Note ? I discussed the limitations, risks, security and privacy concerns of performing an evaluation and management service by telephone and the availability of in person appointments. I also discussed with the patient that there may be a patient responsible charge related to this service. The patient expressed understanding and agreed to proceed.  ? ? ?I connected with Mallory Wall on 02/11/22  at   8:30 AM EDT  EDT by telephone and verified that I am speaking with the correct person using two identifiers. ? ?Location of Patient: ?Private Residence ?  ?Location of Provider: ?Scientist, research (physical sciences) and [employer] Office  ?  ?Persons participating in Telemedicine visit: ?[name] FNP-BC ?Mallory Wall  ?  ?History of Present Illness: ?Telemedicine visit for: right toe neuropathy ?She has a past medical history of Allergy, Back pain, DM2, Gallstones, Hyperlipidemia, and abnormal pap smear ? ? ?Reports Right big toe numbness. Started several weeks ago after wearing tight shoes while at work standing for several hours. Initially the entire top of the foot was numb however that has resolved and now only one toe continues with neuropathy. On recent exam there were no obvious signs of blood flow compromise. The toes and foot are warm to touch, normal capillary refill time and no discoloration. She does not have a history of diabetic neuropathy. She is a smoker.  ?Lab Results  ?Component Value Date  ? HGBA1C 7.0 (A) 01/13/2022  ?  ? ? ?Past Medical History:  ?Diagnosis Date  ? Allergy   ? Back pain   ? Diabetes mellitus without complication ([place])   ? Gallstones   ? Headache(784.0)   ? Hyperlipidemia   ? Vaginal Pap smear, abnormal   ?  ?Past Surgical History:  ?Procedure Laterality Date  ? CHOLECYSTECTOMY N/A 01/13/2018  ? Procedure: LAPAROSCOPIC CHOLECYSTECTOMY;  Surgeon: [name], MD;  Location: [facility];  Service: General;  Laterality: N/A;  ? NO PAST  SURGERIES    ?  ?Family History  ?Problem Relation Age of Onset  ? CAD Mother   ? Hypertension Mother   ? Diabetes type II Sister   ? Lung disease Sister   ? Colon cancer Neg Hx   ? Esophageal cancer Neg Hx   ? Pancreatic cancer Neg Hx   ? Rectal cancer Neg Hx   ? Stomach cancer Neg Hx   ?  ?Social History  ? ?Socioeconomic History  ? Marital status: Single  ?  Spouse name: Not on file  ? Number of children: 0  ? Years of education: Not on file  ? Highest education level: High school graduate  ?Occupational History  ? Not on file  ?Tobacco Use  ? Smoking status: Every Day  ?  Packs/day: 1.00  ?  Types: Cigarettes  ? Smokeless tobacco: Never  ?Vaping Use  ? Vaping Use: Never used  ?Substance and Sexual Activity  ? Alcohol use: No  ? Drug use: No  ? Sexual activity: Not Currently  ?  Birth control/protection: Post-menopausal  ?Other Topics Concern  ? Not on file  ?Social History Narrative  ? Works at [employer]  ? ?Social Determinants of Health  ? ?Financial Resource Strain: Not on file  ?Food Insecurity: Not on file  ?Transportation Needs: Not on file  ?Physical Activity: Not on file  ?Stress: Not on file  ?Social Connections: Not on file  ?  ? ?Observations/Objective: ?Awake, alert and oriented x 3 ? ? ?Review of Systems  ?Constitutional:  Negative for fever, malaise/fatigue and weight loss.  ?HENT: Negative.  Negative for nosebleeds.   ?Eyes: Negative.  Negative for blurred vision, double vision and photophobia.  ?Respiratory: Negative.  Negative for cough and shortness of breath.   ?Cardiovascular: Negative.  Negative for chest pain, palpitations and leg swelling.  ?Gastrointestinal: Negative.  Negative for heartburn, nausea and vomiting.  ?Musculoskeletal: Negative.  Negative for myalgias.  ?Neurological:  Positive for sensory change. Negative for dizziness, focal weakness, seizures and headaches.  ?Psychiatric/Behavioral: Negative.  Negative for suicidal ideas.   ?Assessment and Plan: ?Diagnoses and all  orders for this visit: ? ?Neuropathy ?-     Ambulatory referral to Podiatry ? ?  ? ?Follow Up Instructions ?Return if symptoms worsen or fail to improve.  ? ?  ?I discussed the assessment and treatment plan with the patient. The patient was provided an opportunity to ask questions and all were answered. The patient agreed with the plan and demonstrated an understanding of the instructions. ?  ?The patient was advised to call back or seek an in-person evaluation if the symptoms worsen or if the condition fails to improve as anticipated. ? ?I provided 10 minutes of non-face-to-face time during this encounter including median intraservice time, reviewing previous notes, labs, imaging, medications and explaining diagnosis and management. ? ?[name], FNP-BC  ?

## 2022-02-17 ENCOUNTER — Other Ambulatory Visit: Payer: Self-pay

## 2022-02-18 ENCOUNTER — Other Ambulatory Visit: Payer: Self-pay

## 2022-02-20 ENCOUNTER — Ambulatory Visit (INDEPENDENT_AMBULATORY_CARE_PROVIDER_SITE_OTHER): Payer: 59 | Admitting: Podiatry

## 2022-02-20 ENCOUNTER — Encounter: Payer: Self-pay | Admitting: Podiatry

## 2022-02-20 DIAGNOSIS — G5791 Unspecified mononeuropathy of right lower limb: Secondary | ICD-10-CM | POA: Diagnosis not present

## 2022-02-20 DIAGNOSIS — M2011 Hallux valgus (acquired), right foot: Secondary | ICD-10-CM | POA: Diagnosis not present

## 2022-02-20 DIAGNOSIS — M21611 Bunion of right foot: Secondary | ICD-10-CM | POA: Diagnosis not present

## 2022-02-20 NOTE — Progress Notes (Signed)
?  Subjective:  ?Patient ID: Mallory Wall, female    DOB: May 21, 1961,  MRN: [id number] ? ?Chief Complaint  ?Patient presents with  ? Peripheral Neuropathy  ?   Neuropathy Referring Provider: [name] W  ? Diabetes  ? ? ?61 y.o. female presents with the above complaint. History confirmed with patient.  She complains primarily of numbness along the right great toe she does have some swelling and occasional tingling pain in several of the toes.  Most of this started after working at the [employer] tournament at the [employer] she was working very long hours and shoes and felt tightness along the foot and also quite a bit of swelling towards the end of the day.  She has been in the prediabetes and diabetes range for a few years now she takes metformin for this.  Has not had neuropathy before. ? ?Objective:  ?Physical Exam: ?warm, good capillary refill, no trophic changes or ulcerative lesions, normal DP and PT pulses, normal monofilament exam, normal sensory exam, and good smooth range of motion of MTPJ's, she does have a bunion on the right side with hallux valgus deformity there is some tenderness over the medial eminence ? ?Assessment:  ? ?1. Neuritis of foot, right   ?2. Hallux valgus with bunions, right   ? ? ? ?Plan:  ?Patient was evaluated and treated and all questions answered. ? ?I discussed with her that I doubt that this is diabetic peripheral neuropathy although its not entirely excluded.  She has a normal monofilament exam and her A1c has always been very well controlled.  She does not have any major sensory deficits.  She does have some numbness along the distal medial hallux she does have a bunion here as well.  I think likely she has a compression neuritis of the dorsomedial cutaneous nerve to the first digit secondary to tight shoes long hours at work and swelling in the feet.  I discussed with her that hopefully this would resolve uneventfully and I recommended padding the area over the bunion with a  silicone pad which we dispensed today also discussed wearing compression stockings or sleeves when she does she will be working long hours and making sure her shoes fit well.  Finally we also discussed if it does not improve on its own she could consider surgical correction of the bunion deformity but I would hold off on this until it becomes more painful.  We also discussed that gabapentin could help alleviate some pain but I doubt this will fix the numbness. ? ?No follow-ups on file.  ? ?

## 2022-02-20 NOTE — Patient Instructions (Signed)
Wear the pad over the bump when working. Also may be good to wear compression stockings when working to reduce swelling. Make sure shoes fit properly and aren't too tight ?

## 2022-02-24 MED ORDER — CONTOUR NEXT TEST VI STRP
ORAL_STRIP | 2 refills | Status: DC
  Filled 2022-02-24: qty 100, fill #0
  Filled 2022-03-12: qty 100, 25d supply, fill #0
  Filled 2022-04-14: qty 100, 25d supply, fill #1
  Filled 2022-06-14: qty 100, 25d supply, fill #2

## 2022-02-25 ENCOUNTER — Ambulatory Visit: Payer: Self-pay | Admitting: *Deleted

## 2022-02-25 ENCOUNTER — Ambulatory Visit
Admission: RE | Admit: 2022-02-25 | Discharge: 2022-02-25 | Disposition: A | Discharge status: Home / Self Care | Payer: No Typology Code available for payment source | Source: Ambulatory Visit | Attending: Nurse Practitioner | Admitting: Nurse Practitioner

## 2022-02-25 ENCOUNTER — Other Ambulatory Visit: Payer: Self-pay

## 2022-02-25 VITALS — BP 130/88 | Wt 152.0 lb

## 2022-02-25 DIAGNOSIS — Z01419 Encounter for gynecological examination (general) (routine) without abnormal findings: Secondary | ICD-10-CM

## 2022-02-25 DIAGNOSIS — Z1231 Encounter for screening mammogram for malignant neoplasm of breast: Secondary | ICD-10-CM

## 2022-02-25 DIAGNOSIS — Z1211 Encounter for screening for malignant neoplasm of colon: Secondary | ICD-10-CM

## 2022-02-25 NOTE — Progress Notes (Addendum)
Mallory Wall is a 61 y.o. G1P0010 female who presents to [facility] clinic today with no complaints.  ?  ?Pap Smear: Pap smear completed today. Last Pap smear was 01/17/2021 at [facility] and abnormal-LSIL with positive HPV. 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 Last four Pap smear and three colposcopy results are available in Epic. ?  ?Physical exam: ?Breasts ?Breasts symmetrical. No skin abnormalities bilateral breasts. No nipple retraction bilateral breasts. No nipple discharge bilateral breasts. No lymphadenopathy. No lumps palpated bilateral breasts. No complaints of pain or tenderness on exam.    ? ?MM 3D SCREEN BREAST BILATERAL ? ?Result Date: 06/15/2018 ?CLINICAL DATA:  Screening. EXAM: DIGITAL SCREENING BILATERAL MAMMOGRAM WITH TOMO AND CAD COMPARISON:  Previous exam(s). ACR Breast Density Category b: There are scattered areas of fibroglandular density. FINDINGS: There are no findings suspicious for malignancy. Images were processed with CAD. IMPRESSION: No mammographic evidence of malignancy. A result letter of this screening mammogram will be mailed directly to the patient. RECOMMENDATION: Screening mammogram in one year. (Code:SM-B-01Y) BI-RADS CATEGORY  1: Negative. Electronically Signed   By: Ammie Ferrier M.D.   On: 06/15/2018 10:24  ? ?MS DIGITAL SCREENING TOMO BILATERAL ? ?Result Date: 01/21/2021 ?CLINICAL DATA:  Screening. EXAM: DIGITAL SCREENING BILATERAL MAMMOGRAM WITH TOMOSYNTHESIS AND CAD TECHNIQUE: Bilateral screening digital craniocaudal and mediolateral oblique mammograms were obtained. Bilateral screening digital breast tomosynthesis was  performed. The images were evaluated with computer-aided detection. COMPARISON:  Previous exam(s). ACR Breast Density Category b: There are scattered areas of fibroglandular density. FINDINGS: There are no findings suspicious for malignancy. The images were evaluated with computer-aided detection. IMPRESSION: No mammographic evidence of malignancy. A result letter of this screening mammogram will be mailed directly to the patient. RECOMMENDATION: Screening mammogram in one year. (Code:SM-B-01Y) BI-RADS CATEGORY  1: Negative. Electronically Signed   By: Dorise Bullion III M.D   On: 01/21/2021 08:10  ? ?MS DIGITAL SCREENING TOMO BILATERAL ? ?Result Date: 11/29/2019 ?CLINICAL DATA:  Screening. EXAM: DIGITAL SCREENING BILATERAL MAMMOGRAM WITH TOMO AND CAD COMPARISON:  Previous exam(s). ACR Breast Density Category b: There are scattered areas of fibroglandular density. FINDINGS: There are no findings suspicious for malignancy. Images were processed with CAD. IMPRESSION: No mammographic evidence of malignancy. A result letter of this screening mammogram will be mailed directly to the patient. RECOMMENDATION: Screening mammogram in one year. (Code:SM-B-01Y) BI-RADS CATEGORY  1: Negative. Electronically Signed   By: Nolon Nations M.D.   On: 11/29/2019 16:00   ? ?Pelvic/Bimanual ?Ext Genitalia ?No lesions, no swelling and no discharge observed on external genitalia.      ?  ?Vagina ?Vagina pink and normal texture. No lesions or discharge observed in vagina.      ?  ?Cervix ?Cervix is present. Cervix pink and of normal texture. No discharge observed.  ?  ?Uterus ?Uterus is present and palpable. Uterus in normal position and normal size.      ?  ?Adnexae ?Bilateral ovaries present and palpable. No tenderness on palpation.       ?  ?Rectovaginal ?No rectal exam completed today since patient had no rectal complaints. No skin abnormalities observed on exam.   ?  ?  Smoking History: ?Patient is a current smoker. Discussed smoking  cessation with patient. Referred to the [facility] Quitline and gave resources to the free smoking cessation classes at [facility]. Due to patients history of greater than 30 pack history referred to [facility] for shared decision making visit for lung cancer screening. ?  ?Patient Navigation: ?Patient education provided. Access to services provided for patient through [facility] program.  ? ?Colorectal Cancer Screening: ?Per patient has had colonoscopy completed on 03/18/2016 . Patient completed a FIT Test 01/17/2021 that was negative. No complaints today.  ?  ?Breast and Cervical Cancer Risk Assessment: ?Patient does not have family history of breast cancer, known genetic mutations, or radiation treatment to the chest before age 70. Patient has a history of cervical dysplasia. Patient has no history of being immunocompromised or DES exposure in-utero. ? ?Risk Assessment   ? ? Risk Scores   ? ?   02/25/2022 01/17/2021  ? Last edited by: Demetrius Revel, LPN McGill, Karlton Lemon, LPN  ? 5-year risk: 1.4 % 1.4 %  ? Lifetime risk: 6.9 % 7.1 %  ? ?  ?  ? ?  ? ? ?A: ?BCCCP exam with pap smear ?No complaints. ? ?P: ?Referred patient to the [facility] for a screening mammogram on mobile unit. Appointment scheduled Tuesday, Feb 25, 2022 at 1100. ? ?Loletta Parish, RN ?02/25/2022 10:05 AM   ?

## 2022-02-25 NOTE — Patient Instructions (Signed)
Explained breast self awareness with [name]. Pap smear completed today. Let patient know if today's Pap smear is normal and HPV negative that her next Pap smear is due in one year due to her history of abnormal Pap smears. Referred patient to the [facility] for a screening mammogram on mobile unit. Appointment scheduled Tuesday, Feb 25, 2022 at 1100. Patient aware of appointment and will be there. Let patient know will follow up with her within the next couple weeks with results of Pap smear by phone. Informed patient that the [facility] will follow up with her within the next couple of weeks with results of mammogram by letter or phone. Discussed smoking cessation with patient. Referred to the [facility] Quitline and gave resources to the free smoking cessation classes at [facility]. Mallory Wall verbalized understanding. ? ?Alanmichael Barmore, Arvil Chaco, RN ?10:05 AM ? ? ? ? ?

## 2022-03-03 LAB — CYTOLOGY - PAP
Comment: NEGATIVE
Diagnosis: UNDETERMINED — AB
High risk HPV: POSITIVE — AB

## 2022-03-05 ENCOUNTER — Telehealth: Payer: Self-pay

## 2022-03-05 NOTE — Telephone Encounter (Signed)
Patient informed Pap results, ASC-US with positive HPV, per Dr. Elly Modena, colposcopy is needed. Patient verbalized understanding. Referral sent to [facility].  ?

## 2022-03-12 ENCOUNTER — Other Ambulatory Visit: Payer: Self-pay

## 2022-03-13 ENCOUNTER — Telehealth: Payer: Self-pay | Admitting: Nurse Practitioner

## 2022-03-13 ENCOUNTER — Other Ambulatory Visit: Payer: Self-pay

## 2022-03-13 NOTE — Telephone Encounter (Signed)
Copied from [place] [id number]. Topic: Referral - Status >> Mar 12, 2022  3:34 PM McGill, Nelva Bush wrote: Reason for CRM: Avaletta from [facility] would like to know if the referral that was just sent on 02/24/2022 for podiatry is needed. Avaletta stated pt was seen on 02/20/2022 by Dr. Sherryle Lis.  Avaletta requesting a call back for clarification.

## 2022-03-15 NOTE — Telephone Encounter (Signed)
It can be canceled

## 2022-03-17 ENCOUNTER — Other Ambulatory Visit: Payer: Self-pay

## 2022-03-17 NOTE — Telephone Encounter (Signed)
Called Avaletta and left voicemail.  May cancel the referral.

## 2022-03-18 NOTE — Telephone Encounter (Signed)
Left message on voicemail: Ok to cancel referral for 02/24/2022.  May return call if needed.

## 2022-03-31 ENCOUNTER — Other Ambulatory Visit: Payer: Self-pay

## 2022-03-31 MED ORDER — BRIMONIDINE TARTRATE-TIMOLOL 0.2-0.5 % OP SOLN
OPHTHALMIC | 3 refills | Status: DC
  Filled 2022-03-31: qty 5, 20d supply, fill #0
  Filled 2022-05-05: qty 5, 20d supply, fill #1
  Filled 2022-05-28: qty 5, 20d supply, fill #2
  Filled 2022-06-14: qty 5, 20d supply, fill #3

## 2022-04-01 ENCOUNTER — Other Ambulatory Visit: Payer: Self-pay

## 2022-04-02 ENCOUNTER — Other Ambulatory Visit: Payer: Self-pay

## 2022-04-14 ENCOUNTER — Other Ambulatory Visit: Payer: Self-pay

## 2022-04-16 ENCOUNTER — Other Ambulatory Visit: Payer: Self-pay

## 2022-05-01 ENCOUNTER — Ambulatory Visit: Payer: No Typology Code available for payment source | Admitting: Family Medicine

## 2022-05-05 ENCOUNTER — Other Ambulatory Visit: Payer: Self-pay

## 2022-05-06 ENCOUNTER — Other Ambulatory Visit: Payer: Self-pay

## 2022-05-14 ENCOUNTER — Ambulatory Visit: Payer: 59 | Attending: Nurse Practitioner | Admitting: Nurse Practitioner

## 2022-05-14 ENCOUNTER — Encounter: Payer: Self-pay | Admitting: Nurse Practitioner

## 2022-05-14 VITALS — BP 130/72 | HR 63 | Wt 150.4 lb

## 2022-05-14 DIAGNOSIS — I10 Essential (primary) hypertension: Secondary | ICD-10-CM | POA: Diagnosis not present

## 2022-05-14 DIAGNOSIS — E119 Type 2 diabetes mellitus without complications: Secondary | ICD-10-CM | POA: Diagnosis not present

## 2022-05-14 DIAGNOSIS — J411 Mucopurulent chronic bronchitis: Secondary | ICD-10-CM | POA: Diagnosis not present

## 2022-05-14 LAB — GLUCOSE, POCT (MANUAL RESULT ENTRY): POC Glucose: 131 mg/dl — AB (ref 70–99)

## 2022-05-14 LAB — POCT GLYCOSYLATED HEMOGLOBIN (HGB A1C): HbA1c, POC (controlled diabetic range): 6.9 % (ref 0.0–7.0)

## 2022-05-14 NOTE — Assessment & Plan Note (Signed)
Symptoms stable and well controlled at this time

## 2022-05-14 NOTE — Progress Notes (Signed)
Assessment & Plan:  [name] was seen today for diabetes and medication refill.  Diagnoses and all orders for this visit:  Controlled type 2 diabetes mellitus without complication, without long-term current use of insulin (HCC) -     POCT glucose (manual entry) -     POCT glycosylated hemoglobin (Hb A1C) -     CMP14+EGFR Continue blood sugar control as discussed in office today, low carbohydrate diet, and regular physical exercise as tolerated, 150 minutes per week (30 min each day, 5 days per week, or 50 min 3 days per week). Keep blood sugar logs with fasting goal of 90-130 mg/dl, post prandial (after you eat) less than 180.  For Hypoglycemia: BS <60 and Hyperglycemia BS >400; contact the clinic ASAP.   Chronic bronchitis with productive mucopurulent cough (HCC) Symptoms stable/controlled  Essential hypertension Continue lisinopril as prescribed.  Reminded to bring in blood pressure log for follow  up appointment.  RECOMMENDATIONS: DASH/Mediterranean Diets are healthier choices for HTN.     Patient has been counseled on age-appropriate routine health concerns for screening and prevention. These are reviewed and up-to-date. Referrals have been placed accordingly. Immunizations are up-to-date or declined.    Subjective:   Chief Complaint  Patient presents with   Diabetes   Medication Refill    Mallory Wall 61 y.o. female presents to office today for follow up to DM and HTN  She  has a past medical history of Allergy, Back pain, DM2, Gallstones, HTN,  Hyperlipidemia, and Vaginal Pap smear, abnormal.   HTN Blood pressure is well controlled today with lisinopril 20 mg daily. BP Readings from Last 3 Encounters:  05/14/22 130/72  02/25/22 130/88  01/13/22 130/88    DM 2 Controlled with metformin daily.  Weight is currently stable Lab Results  Component Value Date   HGBA1C 6.9 05/14/2022    Lab Results  Component Value Date   HGBA1C 7.0 (A) 01/13/2022  LDL near  goal with rosuvastatin 73m daily.   Lab Results  Component Value Date   CHOL 126 01/13/2022   CHOL 111 05/08/2020   CHOL 120 05/03/2019   Lab Results  Component Value Date   HDL 44 01/13/2022   HDL 33 (L) 05/08/2020   HDL 42 05/03/2019   Lab Results  Component Value Date   LDLCALC 71 01/13/2022   LDLCALC 68 05/08/2020   LDLCALC 66 05/03/2019   Lab Results  Component Value Date   TRIG 46 01/13/2022   TRIG 40 05/08/2020   TRIG 59 05/03/2019   Lab Results  Component Value Date   CHOLHDL 2.9 01/13/2022   CHOLHDL 3.4 05/08/2020   CHOLHDL 2.9 05/03/2019   The ASCVD Risk score (Arnett DK, et al., 2019) failed to calculate for the following reasons:   The valid total cholesterol range is 130 to 320 mg/dL  Review of Systems  Constitutional:  Negative for fever, malaise/fatigue and weight loss.  HENT: Negative.  Negative for nosebleeds.   Eyes: Negative.  Negative for blurred vision, double vision and photophobia.  Respiratory: Negative.  Negative for cough and shortness of breath.   Cardiovascular: Negative.  Negative for chest pain, palpitations and leg swelling.  Gastrointestinal: Negative.  Negative for heartburn, nausea and vomiting.  Musculoskeletal: Negative.  Negative for myalgias.  Neurological: Negative.  Negative for dizziness, focal weakness, seizures and headaches.  Psychiatric/Behavioral: Negative.  Negative for suicidal ideas.     Past Medical History:  Diagnosis Date   Allergy    Back pain  Diabetes mellitus without complication ([place])    Gallstones    Headache(784.0)    Hyperlipidemia    Vaginal Pap smear, abnormal     Past Surgical History:  Procedure Laterality Date   CHOLECYSTECTOMY N/A 01/13/2018   Procedure: LAPAROSCOPIC CHOLECYSTECTOMY;  Surgeon: [name], MD;  Location: [facility];  Service: General;  Laterality: N/A;   NO PAST SURGERIES      Family History  Problem Relation Age of Onset   CAD Mother     Hypertension Mother    Diabetes type II Sister    Lung disease Sister    Colon cancer Neg Hx    Esophageal cancer Neg Hx    Pancreatic cancer Neg Hx    Rectal cancer Neg Hx    Stomach cancer Neg Hx    Breast cancer Neg Hx     Social History Reviewed with no changes to be made today.   Outpatient Medications Prior to Visit  Medication Sig Dispense Refill   acetaminophen (TYLENOL) 325 MG tablet Take 650 mg by mouth every 6 (six) hours as needed.     Blood Glucose Monitoring Suppl (CONTOUR NEXT MONITOR) w/Device KIT Use to check blood sugar once daily. E11.9 1 kit 0   brimonidine-timolol (COMBIGAN) 0.2-0.5 % ophthalmic solution Place 1 drop into both eyes 2 times daily. 5 mL 3   famotidine (PEPCID) 20 MG tablet TAKE 1 TABLET (20 MG TOTAL) BY MOUTH DAILY. 90 tablet 2   glucose blood (CONTOUR NEXT TEST) test strip Use to check blood sugar twice daily. 100 each 2   latanoprost (XALATAN) 0.005 % ophthalmic solution Place 1 drop into both eyes nightly. 5 mL 3   lidocaine (LIDODERM) 5 % Place 1 patch onto the skin daily. NEEDS PASS 30 patch 0   lisinopril (ZESTRIL) 20 MG tablet Take 1 tablet (20 mg total) by mouth daily. 90 tablet 3   metFORMIN (GLUCOPHAGE) 500 MG tablet Take 1 tablet (500 mg total) by mouth daily with breakfast. 90 tablet 1   methocarbamol (ROBAXIN) 500 MG tablet Take 1 tablet (500 mg total) by mouth 4 (four) times daily. NEEDS PASS 90 tablet 1   Microlet Lancets MISC Use to check blood sugar once daily. E11.9 100 each 2   rosuvastatin (CRESTOR) 5 MG tablet Take 1 tablet (5 mg total) by mouth daily. 90 tablet 2   meclizine (ANTIVERT) 12.5 MG tablet Take 1 tablet (12.5 mg total) by mouth 3 (three) times daily as needed for dizziness. (Patient not taking: Reported on 01/13/2022) 30 tablet 0   omega-3 acid ethyl esters (LOVAZA) 1 g capsule TAKE 1 CAPSULE (1 G TOTAL) BY MOUTH 2 (TWO) TIMES DAILY. 180 capsule 3   No facility-administered medications prior to visit.    No Known  Allergies     Objective:    BP 130/72 (BP Location: Left Arm, Cuff Size: Normal)   Pulse 63   Wt 150 lb 6.4 oz (68.2 kg)   SpO2 99%   BMI 27.51 kg/m  Wt Readings from Last 3 Encounters:  05/14/22 150 lb 6.4 oz (68.2 kg)  02/25/22 152 lb (68.9 kg)  01/13/22 154 lb (69.9 kg)    Physical Exam Vitals and nursing note reviewed.  Constitutional:      Appearance: She is well-developed.  HENT:     Head: Normocephalic and atraumatic.  Cardiovascular:     Rate and Rhythm: Normal rate and regular rhythm.     Heart sounds: Normal heart sounds. No murmur  heard.    No friction rub. No gallop.  Pulmonary:     Effort: Pulmonary effort is normal. No tachypnea or respiratory distress.     Breath sounds: Normal breath sounds. No decreased breath sounds, wheezing, rhonchi or rales.  Chest:     Chest wall: No tenderness.  Abdominal:     General: Bowel sounds are normal.     Palpations: Abdomen is soft.  Musculoskeletal:        General: Normal range of motion.     Cervical back: Normal range of motion.  Skin:    General: Skin is warm and dry.  Neurological:     Mental Status: She is alert and oriented to person, place, and time.     Coordination: Coordination normal.  Psychiatric:        Behavior: Behavior normal. Behavior is cooperative.        Thought Content: Thought content normal.        Judgment: Judgment normal.          Patient has been counseled extensively about nutrition and exercise as well as the importance of adherence with medications and regular follow-up. The patient was given clear instructions to go to ER or return to medical center if symptoms don't improve, worsen or new problems develop. The patient verbalized understanding.   Follow-up: Return in about 3 months (around 08/14/2022).   [name], FNP-BC [facility] [place], [place]   05/14/2022, 10:52 AM

## 2022-05-15 LAB — CMP14+EGFR
ALT: 17 IU/L (ref 0–32)
AST: 17 IU/L (ref 0–40)
Albumin/Globulin Ratio: 1.6 (ref 1.2–2.2)
Albumin: 4.5 g/dL (ref 3.8–4.9)
Alkaline Phosphatase: 79 IU/L (ref 44–121)
BUN/Creatinine Ratio: 18 (ref 12–28)
BUN: 10 mg/dL (ref 8–27)
Bilirubin Total: 0.2 mg/dL (ref 0.0–1.2)
CO2: 25 mmol/L (ref 20–29)
Calcium: 9.9 mg/dL (ref 8.7–10.3)
Chloride: 100 mmol/L (ref 96–106)
Creatinine, Ser: 0.57 mg/dL (ref 0.57–1.00)
Globulin, Total: 2.9 g/dL (ref 1.5–4.5)
Glucose: 108 mg/dL — ABNORMAL HIGH (ref 70–99)
Potassium: 4.2 mmol/L (ref 3.5–5.2)
Sodium: 142 mmol/L (ref 134–144)
Total Protein: 7.4 g/dL (ref 6.0–8.5)
eGFR: 104 mL/min/{1.73_m2} (ref >59)

## 2022-05-23 NOTE — Progress Notes (Unsigned)
    GYNECOLOGY OFFICE COLPOSCOPY PROCEDURE NOTE  61 y.o. G1P0010 here for colposcopy for ASCUS with POSITIVE high risk HPV pap smear on 02/25/2022.   Pregnancy test:  not indicated  Informed consent and review of risks, benefit and alternatives performed. Written consent given.   Speculum inserted into patient's vagina assuring full view of cervix and vaginal walls. 3 swabs of vinegar solution applied to the cervix and vaginal walls and colposcope was used to observe both the cervix and vaginal walls.   Colposcopy adequate? {yes/no:20286}  {Findings; colposcopy:728}; corresponding biopsies obtained.  ECC collected.  All specimens were labeled and sent to pathology.  Monsel's applied to biopsy sites for good hemostasis and speculum removed.  Pt tolerated well with minimal pain and bleeding.   Patient was given post procedure instructions.  Will follow up pathology and manage accordingly; patient will be contacted with results and recommendations.  Routine preventative health maintenance measures emphasized.  Radene Gunning, MD, [place] for [facility], [place]

## 2022-05-26 ENCOUNTER — Other Ambulatory Visit (HOSPITAL_COMMUNITY)
Admission: RE | Admit: 2022-05-26 | Discharge: 2022-05-26 | Disposition: A | Discharge status: Home / Self Care | Payer: 59 | Source: Ambulatory Visit | Attending: Family Medicine | Admitting: Family Medicine

## 2022-05-26 ENCOUNTER — Other Ambulatory Visit: Payer: Self-pay

## 2022-05-26 ENCOUNTER — Encounter: Payer: Self-pay | Admitting: Obstetrics and Gynecology

## 2022-05-26 ENCOUNTER — Ambulatory Visit (INDEPENDENT_AMBULATORY_CARE_PROVIDER_SITE_OTHER): Payer: 59 | Admitting: Obstetrics and Gynecology

## 2022-05-26 VITALS — BP 116/78 | HR 73 | Wt 146.4 lb

## 2022-05-26 DIAGNOSIS — R87610 Atypical squamous cells of undetermined significance on cytologic smear of cervix (ASC-US): Secondary | ICD-10-CM

## 2022-05-26 NOTE — Addendum Note (Signed)
Addended byMariane Baumgarten on: 05/26/2022 03:02 PM   Modules accepted: Orders

## 2022-05-28 ENCOUNTER — Other Ambulatory Visit: Payer: Self-pay

## 2022-05-28 ENCOUNTER — Telehealth: Payer: Self-pay | Admitting: General Practice

## 2022-05-28 LAB — SURGICAL PATHOLOGY

## 2022-05-28 NOTE — Telephone Encounter (Signed)
-----   Message from Radene Gunning, MD sent at 05/28/2022 12:52 PM EDT ----- Her ECC was negative so plan will likely be for pap and HPV testing in one year.  Can you send along a msg to [facility]? Thanks, pad

## 2022-05-28 NOTE — Telephone Encounter (Signed)
Called patient, no answer- left message to call us back for results.

## 2022-05-29 ENCOUNTER — Other Ambulatory Visit: Payer: Self-pay

## 2022-05-29 NOTE — Telephone Encounter (Signed)
Call placed to pt. Spoke with pt. Pt given results per Dr Damita Dunnings. Pt verbalized understanding. Pt states will have insurance by next year. Recall placed for repeat PAP with HPV. Pt agreeable to plan of care. [place]

## 2022-05-30 ENCOUNTER — Other Ambulatory Visit: Payer: Self-pay

## 2022-06-16 ENCOUNTER — Other Ambulatory Visit: Payer: Self-pay

## 2022-06-17 ENCOUNTER — Other Ambulatory Visit: Payer: Self-pay

## 2022-06-18 ENCOUNTER — Other Ambulatory Visit: Payer: Self-pay

## 2022-06-23 LAB — HM DIABETES EYE EXAM

## 2022-06-25 ENCOUNTER — Emergency Department (HOSPITAL_COMMUNITY)
Admission: EM | Admit: 2022-06-25 | Discharge: 2022-06-26 | Disposition: A | Discharge status: Home / Self Care | Payer: 59 | Attending: Emergency Medicine | Admitting: Emergency Medicine

## 2022-06-25 ENCOUNTER — Other Ambulatory Visit: Payer: Self-pay

## 2022-06-25 ENCOUNTER — Encounter (HOSPITAL_COMMUNITY): Payer: Self-pay | Admitting: Emergency Medicine

## 2022-06-25 DIAGNOSIS — I10 Essential (primary) hypertension: Secondary | ICD-10-CM | POA: Insufficient documentation

## 2022-06-25 DIAGNOSIS — R1032 Left lower quadrant pain: Secondary | ICD-10-CM

## 2022-06-25 DIAGNOSIS — E119 Type 2 diabetes mellitus without complications: Secondary | ICD-10-CM | POA: Insufficient documentation

## 2022-06-25 DIAGNOSIS — Z7984 Long term (current) use of oral hypoglycemic drugs: Secondary | ICD-10-CM | POA: Insufficient documentation

## 2022-06-25 DIAGNOSIS — K5790 Diverticulosis of intestine, part unspecified, without perforation or abscess without bleeding: Secondary | ICD-10-CM | POA: Diagnosis not present

## 2022-06-25 DIAGNOSIS — Z79899 Other long term (current) drug therapy: Secondary | ICD-10-CM | POA: Diagnosis not present

## 2022-06-25 LAB — LIPASE, BLOOD: Lipase: 24 U/L (ref 11–51)

## 2022-06-25 LAB — URINALYSIS, ROUTINE W REFLEX MICROSCOPIC
Bilirubin Urine: NEGATIVE
Glucose, UA: NEGATIVE mg/dL
Hgb urine dipstick: NEGATIVE
Ketones, ur: NEGATIVE mg/dL
Leukocytes,Ua: NEGATIVE
Nitrite: NEGATIVE
Protein, ur: NEGATIVE mg/dL
Specific Gravity, Urine: 1.026 (ref 1.005–1.030)
pH: 5 (ref 5.0–8.0)

## 2022-06-25 LAB — COMPREHENSIVE METABOLIC PANEL
ALT: 14 U/L (ref 0–44)
AST: 16 U/L (ref 15–41)
Albumin: 3.9 g/dL (ref 3.5–5.0)
Alkaline Phosphatase: 65 U/L (ref 38–126)
Anion gap: 9 (ref 5–15)
BUN: 9 mg/dL (ref 6–20)
CO2: 27 mmol/L (ref 22–32)
Calcium: 10.1 mg/dL (ref 8.9–10.3)
Chloride: 102 mmol/L (ref 98–111)
Creatinine, Ser: 0.53 mg/dL (ref 0.44–1.00)
GFR, Estimated: >60 mL/min (ref >60)
Glucose, Bld: 95 mg/dL (ref 70–99)
Potassium: 3.6 mmol/L (ref 3.5–5.1)
Sodium: 138 mmol/L (ref 135–145)
Total Bilirubin: 0.3 mg/dL (ref 0.3–1.2)
Total Protein: 7.7 g/dL (ref 6.5–8.1)

## 2022-06-25 LAB — CBC WITH DIFFERENTIAL/PLATELET
Abs Immature Granulocytes: 0.01 10*3/uL (ref 0.00–0.07)
Basophils Absolute: 0 10*3/uL (ref 0.0–0.1)
Basophils Relative: 1 %
Eosinophils Absolute: 0.5 10*3/uL (ref 0.0–0.5)
Eosinophils Relative: 7 %
HCT: 38 % (ref 36.0–46.0)
Hemoglobin: 12.7 g/dL (ref 12.0–15.0)
Immature Granulocytes: 0 %
Lymphocytes Relative: 50 %
Lymphs Abs: 3.6 10*3/uL (ref 0.7–4.0)
MCH: 30.2 pg (ref 26.0–34.0)
MCHC: 33.4 g/dL (ref 30.0–36.0)
MCV: 90.3 fL (ref 80.0–100.0)
Monocytes Absolute: 0.5 10*3/uL (ref 0.1–1.0)
Monocytes Relative: 7 %
Neutro Abs: 2.4 10*3/uL (ref 1.7–7.7)
Neutrophils Relative %: 35 %
Platelets: 295 10*3/uL (ref 150–400)
RBC: 4.21 MIL/uL (ref 3.87–5.11)
RDW: 15 % (ref 11.5–15.5)
WBC: 7 10*3/uL (ref 4.0–10.5)
nRBC: 0 % (ref 0.0–0.2)

## 2022-06-25 NOTE — ED Provider Triage Note (Signed)
Emergency Medicine Provider Triage Evaluation Note  Mallory Wall , a 61 y.o. female  was evaluated in triage.  Pt complains of LLQ abdominal pain that started a few hours ago.  Was on way to work when felt some discomfort, has worsened throughout the day.  With some intermittent nausea.  Endorses some dark colored urine.  Denies blood in urine or bowel movements.  Denies other urinary changes, vomiting, diarrhea, constipation, fever, or chills.  Review of Systems  Positive:  Negative: See above  Physical Exam  BP (!) 141/92 (BP Location: Left Arm)   Pulse 70   Temp 98.6 F (37 C) (Oral)   Resp 18   SpO2 100%  Gen:   Awake, no distress   Resp:  Normal effort  MSK:   Moves extremities without difficulty  Other:  LLQ tenderness.  Abdomen soft.    Medical Decision Making  Medically screening exam initiated at 7:46 PM.  Appropriate orders placed.  Mallory Wall was informed that the remainder of the evaluation will be completed by another provider, this initial triage assessment does not replace that evaluation, and the importance of remaining in the ED until their evaluation is complete.     [place], [place] 58[birth year] [id number]

## 2022-06-25 NOTE — ED Triage Notes (Signed)
Pt c/o LLQ pain that radiates to her back, pain started today. C/o nausea, no vomiting or diarrhea. States in the mornings urine is darker than usual.

## 2022-06-26 ENCOUNTER — Emergency Department (HOSPITAL_COMMUNITY): Payer: 59

## 2022-06-26 ENCOUNTER — Other Ambulatory Visit: Payer: Self-pay

## 2022-06-26 MED ORDER — IBUPROFEN 400 MG PO TABS
400.0000 mg | ORAL_TABLET | Freq: Once | ORAL | Status: AC
  Administered 2022-06-26: 400 mg via ORAL
  Filled 2022-06-26: qty 1

## 2022-06-26 MED ORDER — ONDANSETRON 4 MG PO TBDP
8.0000 mg | ORAL_TABLET | Freq: Once | ORAL | Status: AC
  Administered 2022-06-26: 8 mg via ORAL
  Filled 2022-06-26: qty 2

## 2022-06-26 MED ORDER — ONDANSETRON 8 MG PO TBDP
8.0000 mg | ORAL_TABLET | Freq: Three times a day (TID) | ORAL | 0 refills | Status: DC | PRN
  Filled 2022-06-26: qty 20, 7d supply, fill #0

## 2022-06-26 NOTE — Discharge Instructions (Addendum)
Your evaluation did not show any serious causes for your pain.  You may apply ice for 30 minutes at a time, 4 times a day.  You may take ibuprofen or naproxen as needed for pain.  For additional pain relief, you may add acetaminophen.  Return if your pain is getting worse, or if you have any new or concerning symptoms.

## 2022-06-26 NOTE — ED Provider Notes (Signed)
[facility] EMERGENCY DEPARTMENT Provider Note   CSN: [id number] Arrival date & time: 06/25/22  1808     History  Chief Complaint  Patient presents with   Abdominal Pain    Mallory Wall is a 61 y.o. female.  The history is provided by the patient.  Abdominal Pain She has history of hypertension, diabetes, hyperlipidemia comes in complaining of left lower quadrant pain which started this morning and has radiated to the left flank.  She has noted that she has to keep shifting positions to get comfortable although it does seem to be worse if she lays on the right side.  There has been some nausea but no vomiting.  She denies constipation or diarrhea and denies any urinary urgency or frequency or tenesmus or dysuria.  In the past, she has noted dark urine when she wakes up, but she has not noted dark urine today.   Home Medications Prior to Admission medications   Medication Sig Start Date End Date Taking? Authorizing Provider  acetaminophen (TYLENOL) 325 MG tablet Take 650 mg by mouth every 6 (six) hours as needed.    [provider]  Blood Glucose Monitoring Suppl (CONTOUR NEXT MONITOR) w/Device KIT Use to check blood sugar once daily. E11.9 01/13/22   Charlott Rakes, MD  brimonidine-timolol (COMBIGAN) 0.2-0.5 % ophthalmic solution Place 1 drop into both eyes 2 times daily. 03/31/22     famotidine (PEPCID) 20 MG tablet TAKE 1 TABLET (20 MG TOTAL) BY MOUTH DAILY. 01/13/22 01/13/23  [name], NP  glucose blood (CONTOUR NEXT TEST) test strip Use to check blood sugar twice daily. 02/24/22   [name], NP  latanoprost (XALATAN) 0.005 % ophthalmic solution Place 1 drop into both eyes nightly. 01/13/22   [name], NP  lidocaine (LIDODERM) 5 % Place 1 patch onto the skin daily. NEEDS PASS 01/13/22   [name], NP  lisinopril (ZESTRIL) 20 MG tablet Take 1 tablet (20 mg total) by mouth daily. 01/13/22   [name], NP  metFORMIN  (GLUCOPHAGE) 500 MG tablet Take 1 tablet (500 mg total) by mouth daily with breakfast. 01/13/22 07/15/22  [name], NP  methocarbamol (ROBAXIN) 500 MG tablet Take 1 tablet (500 mg total) by mouth 4 (four) times daily. NEEDS PASS 01/13/22   [name], NP  Microlet Lancets MISC Use to check blood sugar once daily. E11.9 01/13/22   Charlott Rakes, MD  rosuvastatin (CRESTOR) 5 MG tablet Take 1 tablet (5 mg total) by mouth daily. 01/13/22 07/28/22  [name], NP      Allergies    Patient has no known allergies.    Review of Systems   Review of Systems  Gastrointestinal:  Positive for abdominal pain.  All other systems reviewed and are negative.   Physical Exam Updated Vital Signs BP (!) 141/87   Pulse 82   Temp 98 F (36.7 C)   Resp 17   SpO2 98%  Physical Exam Vitals and nursing note reviewed.   61 year old female, resting comfortably and in no acute distress. Vital signs are normal. Oxygen saturation is 98%, which is normal. Head is normocephalic and atraumatic. PERRLA, EOMI. Oropharynx is clear. Neck is nontender and supple without adenopathy or JVD. Back is nontender and there is no CVA tenderness. Lungs are clear without rales, wheezes, or rhonchi. Chest is nontender. Heart has regular rate and rhythm without murmur. Abdomen is soft, flat, with mild left lower quadrant  tenderness.  There is no rebound or guarding.. Extremities have no cyanosis or edema, full range of motion is present. Skin is warm and dry without rash. Neurologic: Mental status is normal, cranial nerves are intact, moves all extremities equally.  ED Results / Procedures / Treatments   Labs (all labs ordered are listed, but only abnormal results are displayed) Labs Reviewed  CBC WITH DIFFERENTIAL/PLATELET  COMPREHENSIVE METABOLIC PANEL  LIPASE, BLOOD  URINALYSIS, ROUTINE W REFLEX MICROSCOPIC   Radiology CT Renal Stone Study  Result Date: 06/26/2022 CLINICAL DATA:  Left lower quadrant  pain. EXAM: CT ABDOMEN AND PELVIS WITHOUT CONTRAST TECHNIQUE: Multidetector CT imaging of the abdomen and pelvis was performed following the standard protocol without IV contrast. RADIATION DOSE REDUCTION: This exam was performed according to the departmental dose-optimization program which includes automated exposure control, adjustment of the mA and/or kV according to patient size and/or use of iterative reconstruction technique. COMPARISON:  None Available. FINDINGS: Lower chest: No acute abnormality. Hepatobiliary: No focal liver abnormality is seen. Status post cholecystectomy. No biliary dilatation. Pancreas: Unremarkable. No pancreatic ductal dilatation or surrounding inflammatory changes. Spleen: Normal in size without focal abnormality. Adrenals/Urinary Tract: Adrenal glands are unremarkable. Kidneys are normal, without renal calculi, focal lesion, or hydronephrosis. The urinary bladder is poorly distended and subsequently limited in evaluation. Stomach/Bowel: Stomach is within normal limits. The appendix is not identified. No evidence of bowel wall thickening, distention, or inflammatory changes. Numerous diverticula are seen throughout the large bowel. Vascular/Lymphatic: Aortic atherosclerosis. No enlarged abdominal or pelvic lymph nodes. Reproductive: A 3.3 cm x 2.6 cm partially calcified heterogeneous uterine fibroid is seen along the posterior aspect of the uterine fundus. The bilateral adnexa are unremarkable Other: No abdominal wall hernia or abnormality. No abdominopelvic ascites. Musculoskeletal: Marked severity chronic and degenerative changes are seen at the level of L2-L3. IMPRESSION: 1. Colonic diverticulosis. 2. Evidence of prior cholecystectomy. 3. Calcified uterine fibroid. 4. Marked severity chronic and degenerative changes at the level of L2-L3. Aortic Atherosclerosis (ICD10-I70.0). Electronically Signed   By: Virgina Norfolk M.D.   On: 06/26/2022 03:05    Procedures Procedures     Medications Ordered in ED Medications  ondansetron (ZOFRAN-ODT) disintegrating tablet 8 mg (has no administration in time range)  ibuprofen (ADVIL) tablet 400 mg (has no administration in time range)    ED Course/ Medical Decision Making/ A&P                           Medical Decision Making Amount and/or Complexity of Data Reviewed Radiology: ordered.  Risk Prescription drug management.   Left lower quadrant pain of uncertain cause.  Differential was broad but includes, but is not limited to, renal colic, pyelonephritis, diverticulitis, muscular strain, abdominal aortic aneurysm.  I have reviewed and interpreted all of the laboratory tests and my interpretation is normal comprehensive metabolic panel, normal lipase, normal CBC, normal urinalysis.  I have ordered a dose of ondansetron for nausea and ibuprofen for pain.  I have ordered a renal stone protocol CT scan to evaluate for possible urolithiasis.  CT scan shows diverticulosis without diverticulitis, calcified uterine fibroid, no acute process.  Specifically, no evidence of urolithiasis or nephrolithiasis.  Patient is advised of these findings, told that pain is most likely musculoskeletal.  Advised to apply ice and use over-the-counter NSAIDs and acetaminophen as needed for pain.  Given prescription for ondansetron oral dissolving tablet for nausea.  Return if symptoms are worsening.  Otherwise, follow-up with  PCP.        Final Clinical Impression(s) / ED Diagnoses Final diagnoses:  LLQ abdominal pain    Rx / DC Orders ED Discharge Orders          Ordered    ondansetron (ZOFRAN-ODT) 8 MG disintegrating tablet  Every 8 hours PRN        06/26/22 9447              Delora Fuel, MD 39/58/44 [phone]

## 2022-07-01 ENCOUNTER — Other Ambulatory Visit: Payer: Self-pay

## 2022-07-21 ENCOUNTER — Other Ambulatory Visit: Payer: Self-pay

## 2022-07-21 ENCOUNTER — Other Ambulatory Visit: Payer: Self-pay | Admitting: Nurse Practitioner

## 2022-07-21 DIAGNOSIS — E119 Type 2 diabetes mellitus without complications: Secondary | ICD-10-CM

## 2022-07-21 MED ORDER — BRIMONIDINE TARTRATE-TIMOLOL 0.2-0.5 % OP SOLN
1.0000 [drp] | Freq: Two times a day (BID) | OPHTHALMIC | 3 refills | Status: DC
  Filled 2022-07-21: qty 5, 25d supply, fill #0

## 2022-07-22 ENCOUNTER — Other Ambulatory Visit: Payer: Self-pay

## 2022-07-22 MED ORDER — METFORMIN HCL 500 MG PO TABS
500.0000 mg | ORAL_TABLET | Freq: Every day | ORAL | 0 refills | Status: DC
  Filled 2022-07-22: qty 30, 30d supply, fill #0

## 2022-07-22 NOTE — Telephone Encounter (Signed)
Requested medications are due for refill today.  yes  Requested medications are on the active medications list.  yes  Last refill. 01/13/2022 #90 1 rf  Future visit scheduled.   yes  Notes to clinic.  Rx written to expire 07/15/2022 - Rx is expired.    Requested Prescriptions  Pending Prescriptions Disp Refills   metFORMIN (GLUCOPHAGE) 500 MG tablet 90 tablet 1    Sig: Take 1 tablet (500 mg total) by mouth daily with breakfast.     Endocrinology:  Diabetes - Biguanides Failed - 07/21/2022  2:36 PM      Failed - B12 Level in normal range and within 720 days    Vitamin B-12  Date Value Ref Range Status  11/24/2014 382 211 - 911 pg/mL Final         Passed - Cr in normal range and within 360 days    Creat  Date Value Ref Range Status  02/12/2016 0.54 0.50 - 1.05 mg/dL Final   Creatinine, Ser  Date Value Ref Range Status  06/25/2022 0.53 0.44 - 1.00 mg/dL Final         Passed - HBA1C is between 0 and 7.9 and within 180 days    HbA1c, POC (prediabetic range)  Date Value Ref Range Status  11/26/2018 6.7 (A) 5.7 - 6.4 % Final   HbA1c, POC (controlled diabetic range)  Date Value Ref Range Status  05/14/2022 6.9 0.0 - 7.0 % Final         Passed - eGFR in normal range and within 360 days    GFR, Est African American  Date Value Ref Range Status  11/24/2014 >89 mL/min Final   GFR calc Af Amer  Date Value Ref Range Status  11/05/2020 117 >59 mL/min/1.73 Final    Comment:    **In accordance with recommendations from the NKF-ASN Task force,**   [facility] is in the process of updating its eGFR calculation to the   2021 CKD-EPI creatinine equation that estimates kidney function   without a race variable.    GFR, Est Non African American  Date Value Ref Range Status  11/24/2014 >89 mL/min Final    Comment:      The estimated GFR is a calculation valid for adults (>=25 years old) that uses the CKD-EPI algorithm to adjust for age and sex. It is   not to be used for children,  pregnant women, hospitalized patients,    patients on dialysis, or with rapidly changing kidney function. According to the NKDEP, eGFR >89 is normal, 60-89 shows mild impairment, 30-59 shows moderate impairment, 15-29 shows severe impairment and <15 is ESRD.      GFR, Estimated  Date Value Ref Range Status  06/25/2022 >60 >60 mL/min Final    Comment:    (NOTE) Calculated using the CKD-EPI Creatinine Equation (2021)    eGFR  Date Value Ref Range Status  05/14/2022 104 >59 mL/min/1.73 Final         Passed - Valid encounter within last 6 months    Recent Outpatient Visits           2 months ago Controlled type 2 diabetes mellitus without complication, without long-term current use of insulin [facility])   [place], [place] W, NP   5 months ago Neuropathy   [place], [place] W, NP   6 months ago Controlled type 2 diabetes mellitus without complication, without long-term current use of insulin ([place])  [place], Vernia Buff, NP   8 months ago [place], [place], NP   11 months ago Controlled type 2 diabetes mellitus without complication, without long-term current use of insulin ([place])   [place], Vernia Buff, NP       Future Appointments             In 3 weeks [name], NP [place] - CBC within normal limits and completed in the last 12 months    WBC  Date Value Ref Range Status  06/25/2022 7.0 4.0 - 10.5 K/uL Final   RBC  Date Value Ref Range Status  06/25/2022 4.21 3.87 - 5.11 MIL/uL Final   Hemoglobin  Date Value Ref Range Status  06/25/2022 12.7 12.0 - 15.0 g/dL Final  08/05/2021 12.8 11.1 - 15.9 g/dL Final   HCT  Date Value Ref Range Status  06/25/2022 38.0 36.0 - 46.0 % Final   Hematocrit  Date Value  Ref Range Status  08/05/2021 40.1 34.0 - 46.6 % Final   MCHC  Date Value Ref Range Status  06/25/2022 33.4 30.0 - 36.0 g/dL Final   [facility] An  Date Value Ref Range Status  06/25/2022 30.2 26.0 - 34.0 pg Final   MCV  Date Value Ref Range Status  06/25/2022 90.3 80.0 - 100.0 fL Final  08/05/2021 92 79 - 97 fL Final   No results found for: "PLTCOUNTKUC", "LABPLAT", "POCPLA" RDW  Date Value Ref Range Status  06/25/2022 15.0 11.5 - 15.5 % Final  08/05/2021 13.3 11.7 - 15.4 % Final

## 2022-07-23 ENCOUNTER — Other Ambulatory Visit: Payer: Self-pay

## 2022-08-15 ENCOUNTER — Encounter: Payer: Self-pay | Admitting: Nurse Practitioner

## 2022-08-15 ENCOUNTER — Ambulatory Visit: Payer: Commercial Managed Care - HMO | Attending: Nurse Practitioner | Admitting: Nurse Practitioner

## 2022-08-15 ENCOUNTER — Other Ambulatory Visit: Payer: Self-pay

## 2022-08-15 VITALS — BP 126/78 | HR 71 | Temp 97.8°F | Wt 150.2 lb

## 2022-08-15 DIAGNOSIS — E119 Type 2 diabetes mellitus without complications: Secondary | ICD-10-CM | POA: Diagnosis not present

## 2022-08-15 DIAGNOSIS — G43009 Migraine without aura, not intractable, without status migrainosus: Secondary | ICD-10-CM

## 2022-08-15 DIAGNOSIS — I10 Essential (primary) hypertension: Secondary | ICD-10-CM

## 2022-08-15 DIAGNOSIS — R42 Dizziness and giddiness: Secondary | ICD-10-CM

## 2022-08-15 DIAGNOSIS — E785 Hyperlipidemia, unspecified: Secondary | ICD-10-CM

## 2022-08-15 MED ORDER — TOPIRAMATE 25 MG PO TABS
25.0000 mg | ORAL_TABLET | Freq: Two times a day (BID) | ORAL | 1 refills | Status: DC
  Filled 2022-08-15: qty 90, 23d supply, fill #0
  Filled 2022-10-19: qty 90, 23d supply, fill #1

## 2022-08-15 NOTE — Progress Notes (Unsigned)
Assessment & Plan:  There are no diagnoses linked to this encounter.  Patient has been counseled on age-appropriate routine health concerns for screening and prevention. These are reviewed and up-to-date. Referrals have been placed accordingly. Immunizations are up-to-date or declined.    Subjective:  No chief complaint on file.  HPI Mallory Wall 61 y.o. female presents to office today    BP is elevated today.   126/78 manual  BP Readings from Last 3 Encounters:  08/15/22 (!) 142/91  06/26/22 133/78  05/26/22 116/78      Feels lightheaded when she is lying down. Goes away after a few minutes. Sometimes when she turns her head or moves her head down.  Also has balance issues. Usually when she gets out of bed or walks to store.   Taking goody powders for heaches and bcs Having headaches every day. Wants to try excedrin every day for now.      Taking mucinex and tussin. Onset 2 weeks ago. Felt some back pain as well. Was hard to get it coughed up. Grey sputum. Sometimes feels like something is stuck in her througat.       ROS  Past Medical History:  Diagnosis Date   Allergy    Back pain    Diabetes mellitus without complication ([place])    Gallstones    Headache(784.0)    Hyperlipidemia    Vaginal Pap smear, abnormal     Past Surgical History:  Procedure Laterality Date   CHOLECYSTECTOMY N/A 01/13/2018   Procedure: LAPAROSCOPIC CHOLECYSTECTOMY;  Surgeon: [name], MD;  Location: [place];  Service: General;  Laterality: N/A;   NO PAST SURGERIES      Family History  Problem Relation Age of Onset   CAD Mother    Hypertension Mother    Diabetes type II Sister    Lung disease Sister    Colon cancer Neg Hx    Esophageal cancer Neg Hx    Pancreatic cancer Neg Hx    Rectal cancer Neg Hx    Stomach cancer Neg Hx    Breast cancer Neg Hx     Social History Reviewed with no changes to be made today.   Outpatient Medications  Prior to Visit  Medication Sig Dispense Refill   Blood Glucose Monitoring Suppl (CONTOUR NEXT MONITOR) w/Device KIT Use to check blood sugar once daily. E11.9 1 kit 0   brimonidine-timolol (COMBIGAN) 0.2-0.5 % ophthalmic solution Place 1 drop into both eyes 2 (two) times daily. 5 mL 3   famotidine (PEPCID) 20 MG tablet TAKE 1 TABLET (20 MG TOTAL) BY MOUTH DAILY. 90 tablet 2   glucose blood (CONTOUR NEXT TEST) test strip Use to check blood sugar twice daily. 100 each 2   latanoprost (XALATAN) 0.005 % ophthalmic solution Place 1 drop into both eyes nightly. 5 mL 3   lisinopril (ZESTRIL) 20 MG tablet Take 1 tablet (20 mg total) by mouth daily. 90 tablet 3   metFORMIN (GLUCOPHAGE) 500 MG tablet Take 1 tablet (500 mg total) by mouth daily with breakfast. 30 tablet 0   methocarbamol (ROBAXIN) 500 MG tablet Take 1 tablet (500 mg total) by mouth 4 (four) times daily. NEEDS PASS 90 tablet 1   Microlet Lancets MISC Use to check blood sugar once daily. E11.9 100 each 2   rosuvastatin (CRESTOR) 5 MG tablet Take 1 tablet (5 mg total) by mouth daily. 90 tablet 2   acetaminophen (TYLENOL) 325 MG tablet Take 650 mg by mouth  every 6 (six) hours as needed. (Patient not taking: Reported on 08/15/2022)     lidocaine (LIDODERM) 5 % Place 1 patch onto the skin daily. NEEDS PASS (Patient not taking: Reported on 08/15/2022) 30 patch 0   ondansetron (ZOFRAN-ODT) 8 MG disintegrating tablet Take 1 tablet (8 mg total) by mouth every 8 (eight) hours as needed for nausea or vomiting. (Patient not taking: Reported on 08/15/2022) 20 tablet 0   No facility-administered medications prior to visit.    No Known Allergies     Objective:    BP (!) 142/91   Pulse 71   Temp 97.8 F (36.6 C) (Temporal)   Wt 150 lb 3.2 oz (68.1 kg)   SpO2 98%   BMI 27.47 kg/m  Wt Readings from Last 3 Encounters:  08/15/22 150 lb 3.2 oz (68.1 kg)  05/26/22 146 lb 6.4 oz (66.4 kg)  05/14/22 150 lb 6.4 oz (68.2 kg)    Physical Exam        Patient has been counseled extensively about nutrition and exercise as well as the importance of adherence with medications and regular follow-up. The patient was given clear instructions to go to ER or return to medical center if symptoms don't improve, worsen or new problems develop. The patient verbalized understanding.   Follow-up: No follow-ups on file.   [name], FNP-BC [facility] and [facility] [place], [place]   08/15/2022, 11:25 AM

## 2022-08-16 LAB — THYROID PANEL WITH TSH
Free Thyroxine Index: 2.1 (ref 1.2–4.9)
T3 Uptake Ratio: 25 % (ref 24–39)
T4, Total: 8.5 ug/dL (ref 4.5–12.0)
TSH: 0.68 u[IU]/mL (ref 0.450–4.500)

## 2022-08-16 LAB — LIPID PANEL
Chol/HDL Ratio: 3.1 ratio (ref 0.0–4.4)
Cholesterol, Total: 146 mg/dL (ref 100–199)
HDL: 47 mg/dL (ref >39)
LDL Chol Calc (NIH): 88 mg/dL (ref 0–99)
Triglycerides: 49 mg/dL (ref 0–149)
VLDL Cholesterol Cal: 11 mg/dL (ref 5–40)

## 2022-08-16 LAB — HEMOGLOBIN A1C
Est. average glucose Bld gHb Est-mCnc: 143 mg/dL
Hgb A1c MFr Bld: 6.6 % — ABNORMAL HIGH (ref 4.8–5.6)

## 2022-08-17 ENCOUNTER — Encounter: Payer: Self-pay | Admitting: Nurse Practitioner

## 2022-08-17 MED ORDER — METFORMIN HCL 500 MG PO TABS
500.0000 mg | ORAL_TABLET | Freq: Every day | ORAL | 1 refills | Status: DC
  Filled 2022-08-17: qty 30, 30d supply, fill #0
  Filled 2022-09-21: qty 30, 30d supply, fill #1
  Filled 2022-10-19: qty 30, 30d supply, fill #2

## 2022-08-17 MED ORDER — LISINOPRIL 20 MG PO TABS
20.0000 mg | ORAL_TABLET | Freq: Every day | ORAL | 3 refills | Status: DC
  Filled 2022-08-17: qty 30, 30d supply, fill #0
  Filled 2022-09-21: qty 30, 30d supply, fill #1
  Filled 2022-10-19: qty 30, 30d supply, fill #2
  Filled 2022-11-21: qty 30, 30d supply, fill #3
  Filled 2022-12-29: qty 30, 30d supply, fill #4
  Filled 2023-01-26: qty 30, 30d supply, fill #5

## 2022-08-18 ENCOUNTER — Other Ambulatory Visit: Payer: Self-pay

## 2022-09-16 ENCOUNTER — Other Ambulatory Visit (HOSPITAL_COMMUNITY): Payer: Self-pay

## 2022-09-21 ENCOUNTER — Other Ambulatory Visit: Payer: Self-pay | Admitting: Nurse Practitioner

## 2022-09-21 DIAGNOSIS — H401131 Primary open-angle glaucoma, bilateral, mild stage: Secondary | ICD-10-CM

## 2022-09-22 ENCOUNTER — Other Ambulatory Visit: Payer: Self-pay

## 2022-09-23 ENCOUNTER — Other Ambulatory Visit: Payer: Self-pay

## 2022-09-23 MED ORDER — LATANOPROST 0.005 % OP SOLN
OPHTHALMIC | 3 refills | Status: DC
  Filled 2022-09-23: qty 5, 30d supply, fill #0
  Filled 2022-10-19: qty 2.5, 25d supply, fill #1
  Filled 2022-11-21: qty 2.5, 25d supply, fill #2
  Filled 2022-12-29: qty 2.5, 25d supply, fill #3
  Filled 2023-01-26 – 2023-01-27 (×2): qty 2.5, 25d supply, fill #4

## 2022-09-23 NOTE — Telephone Encounter (Signed)
Requested Prescriptions  Pending Prescriptions Disp Refills   latanoprost (XALATAN) 0.005 % ophthalmic solution 5 mL 3    Sig: Place 1 drop into both eyes nightly.     Ophthalmology:  Glaucoma Passed - 09/21/2022 12:15 PM      Passed - Valid encounter within last 12 months    Recent Outpatient Visits           1 month ago Essential hypertension   [place], [place] W, NP   4 months ago Controlled type 2 diabetes mellitus without complication, without long-term current use of insulin [facility])   [place], Vernia Buff, NP   7 months ago Neuropathy   [place], Vernia Buff, NP   8 months ago Controlled type 2 diabetes mellitus without complication, without long-term current use of insulin [facility])   [place], Vernia Buff, NP   10 months ago [place] [name], NP       Future Appointments             In 1 month [name], NP [place]

## 2022-09-29 ENCOUNTER — Other Ambulatory Visit: Payer: Self-pay

## 2022-10-21 ENCOUNTER — Other Ambulatory Visit: Payer: Self-pay

## 2022-10-22 ENCOUNTER — Other Ambulatory Visit: Payer: Self-pay

## 2022-11-17 ENCOUNTER — Ambulatory Visit: Payer: Commercial Managed Care - HMO | Attending: Nurse Practitioner | Admitting: Nurse Practitioner

## 2022-11-17 ENCOUNTER — Encounter: Payer: Self-pay | Admitting: Nurse Practitioner

## 2022-11-17 ENCOUNTER — Other Ambulatory Visit: Payer: Self-pay

## 2022-11-17 VITALS — BP 124/85 | HR 82 | Ht 62.0 in | Wt 150.8 lb

## 2022-11-17 DIAGNOSIS — E119 Type 2 diabetes mellitus without complications: Secondary | ICD-10-CM

## 2022-11-17 DIAGNOSIS — E785 Hyperlipidemia, unspecified: Secondary | ICD-10-CM

## 2022-11-17 DIAGNOSIS — G43009 Migraine without aura, not intractable, without status migrainosus: Secondary | ICD-10-CM

## 2022-11-17 DIAGNOSIS — I10 Essential (primary) hypertension: Secondary | ICD-10-CM | POA: Diagnosis not present

## 2022-11-17 MED ORDER — ROSUVASTATIN CALCIUM 5 MG PO TABS
5.0000 mg | ORAL_TABLET | Freq: Every day | ORAL | 2 refills | Status: DC
  Filled 2022-11-17 – 2022-12-29 (×2): qty 30, 30d supply, fill #0

## 2022-11-17 MED ORDER — TOPIRAMATE 25 MG PO TABS
25.0000 mg | ORAL_TABLET | Freq: Two times a day (BID) | ORAL | 1 refills | Status: DC
  Filled 2022-11-17: qty 90, 23d supply, fill #0

## 2022-11-17 NOTE — Progress Notes (Signed)
Assessment & Plan:  Mallory Wall was seen today for hypertension.  Diagnoses and all orders for this visit:  Primary hypertension -     CMP14+EGFR Continue all antihypertensives as prescribed.  Reminded to bring in blood pressure log for follow  up appointment.  RECOMMENDATIONS: DASH/Mediterranean Diets are healthier choices for HTN.    Dyslipidemia, goal LDL below 70 -     rosuvastatin (CRESTOR) 5 MG tablet; Take 1 tablet (5 mg total) by mouth daily. INSTRUCTIONS: Work on a low fat, heart healthy diet and participate in regular aerobic exercise program by working out at least 150 minutes per week; 5 days a week-30 minutes per day. Avoid red meat/beef/steak,  fried foods. junk foods, sodas, sugary drinks, unhealthy snacking, alcohol and smoking.  Drink at least 80 oz of water per day and monitor your carbohydrate intake daily.    Migraine without aura and without status migrainosus, not intractable WELL CONTROLLED-     topiramate (TOPAMAX) 25 MG tablet; Take 1-2 tablets (25-50 mg total) by mouth 2 (two) times daily.  Controlled type 2 diabetes mellitus without complication, without long-term current use of insulin (HCC) -     Hemoglobin A1c Continue blood sugar control as discussed in office today, low carbohydrate diet, and regular physical exercise as tolerated, 150 minutes per week (30 min each day, 5 days per week, or 50 min 3 days per week). Keep blood sugar logs with fasting goal of 90-130 mg/dl, post prandial (after you eat) less than 180.  For Hypoglycemia: BS <60 and Hyperglycemia BS >400; contact the clinic ASAP. Annual eye exams and foot exams are recommended.     Patient has been counseled on age-appropriate routine health concerns for screening and prevention. These are reviewed and up-to-date. Referrals have been placed accordingly. Immunizations are up-to-date or declined.    Subjective:   Chief Complaint  Patient presents with   Hypertension   HPI Mallory Wall 62  y.o. female presents to office today for f/u to HTN.  Notes increased family stress over the past several months.  HTN Blood pressure is well controlled. She is trying to quit smoking but has been unable to do so due to family stressors. Taking lisinopril 20 mg daily as prescribed.  BP Readings from Last 3 Encounters:  11/17/22 124/85  08/15/22 126/78  06/26/22 133/78    DM 2 Well controlled with metformin 500 mg Dialy.  Lab Results  Component Value Date   HGBA1C 6.6 (H) 08/15/2022  LDL not quite at goal with rosuvastatin 5 mg daily. Lab Results  Component Value Date   LDLCALC 88 08/15/2022    Review of Systems  Constitutional:  Negative for fever, malaise/fatigue and weight loss.  HENT: Negative.  Negative for nosebleeds.   Eyes: Negative.  Negative for blurred vision, double vision and photophobia.  Respiratory: Negative.  Negative for cough and shortness of breath.   Cardiovascular: Negative.  Negative for chest pain, palpitations and leg swelling.  Gastrointestinal: Negative.  Negative for heartburn, nausea and vomiting.  Musculoskeletal: Negative.  Negative for myalgias.  Neurological: Negative.  Negative for dizziness, focal weakness, seizures and headaches.  Psychiatric/Behavioral: Negative.  Negative for suicidal ideas.     Past Medical History:  Diagnosis Date   Allergy    Back pain    Diabetes mellitus without complication ([place])    Gallstones    Headache(784.0)    Hyperlipidemia    Vaginal Pap smear, abnormal     Past Surgical History:  Procedure Laterality Date  CHOLECYSTECTOMY N/A 01/13/2018   Procedure: LAPAROSCOPIC CHOLECYSTECTOMY;  Surgeon: [name], MD;  Location: [place];  Service: General;  Laterality: N/A;   NO PAST SURGERIES      Family History  Problem Relation Age of Onset   CAD Mother    Hypertension Mother    Diabetes type II Sister    Lung disease Sister    Colon cancer Neg Hx    Esophageal cancer Neg Hx     Pancreatic cancer Neg Hx    Rectal cancer Neg Hx    Stomach cancer Neg Hx    Breast cancer Neg Hx     Social History Reviewed with no changes to be made today.   Outpatient Medications Prior to Visit  Medication Sig Dispense Refill   Blood Glucose Monitoring Suppl (CONTOUR NEXT MONITOR) w/Device KIT Use to check blood sugar once daily. E11.9 1 kit 0   famotidine (PEPCID) 20 MG tablet TAKE 1 TABLET (20 MG TOTAL) BY MOUTH DAILY. 90 tablet 2   glucose blood (CONTOUR NEXT TEST) test strip Use to check blood sugar twice daily. 100 each 2   latanoprost (XALATAN) 0.005 % ophthalmic solution Place 1 drop into both eyes nightly. 5 mL 3   lisinopril (ZESTRIL) 20 MG tablet Take 1 tablet (20 mg total) by mouth daily. 90 tablet 3   metFORMIN (GLUCOPHAGE) 500 MG tablet Take 1 tablet (500 mg total) by mouth daily with breakfast (Please keep upcoming appointment for additional refills) 90 tablet 1   methocarbamol (ROBAXIN) 500 MG tablet Take 1 tablet (500 mg total) by mouth 4 (four) times daily. NEEDS PASS 90 tablet 1   Microlet Lancets MISC Use to check blood sugar once daily. E11.9 100 each 2   ondansetron (ZOFRAN-ODT) 8 MG disintegrating tablet Take 1 tablet (8 mg total) by mouth every 8 (eight) hours as needed for nausea or vomiting. 20 tablet 0   rosuvastatin (CRESTOR) 5 MG tablet Take 1 tablet (5 mg total) by mouth daily. 90 tablet 2   topiramate (TOPAMAX) 25 MG tablet Take 1-2 tablets (25-50 mg total) by mouth 2 (two) times daily. 90 tablet 1   acetaminophen (TYLENOL) 325 MG tablet Take 650 mg by mouth every 6 (six) hours as needed. (Patient not taking: Reported on 08/15/2022)     brimonidine-timolol (COMBIGAN) 0.2-0.5 % ophthalmic solution Place 1 drop into both eyes 2 (two) times daily. (Patient not taking: Reported on 11/17/2022) 5 mL 3   No facility-administered medications prior to visit.    No Known Allergies     Objective:    BP 124/85   Pulse 82   Ht '5\' 2"'$  (1.575 m)   Wt 150 lb 12.8  oz (68.4 kg)   SpO2 98%   BMI 27.58 kg/m  Wt Readings from Last 3 Encounters:  11/17/22 150 lb 12.8 oz (68.4 kg)  08/15/22 150 lb 3.2 oz (68.1 kg)  05/26/22 146 lb 6.4 oz (66.4 kg)    Physical Exam Vitals and nursing note reviewed.  Constitutional:      Appearance: She is well-developed.  HENT:     Head: Normocephalic and atraumatic.  Cardiovascular:     Rate and Rhythm: Normal rate and regular rhythm.     Heart sounds: Normal heart sounds. No murmur heard.    No friction rub. No gallop.  Pulmonary:     Effort: Pulmonary effort is normal. No tachypnea or respiratory distress.     Breath sounds: Normal breath sounds. No decreased breath  sounds, wheezing, rhonchi or rales.  Chest:     Chest wall: No tenderness.  Abdominal:     General: Bowel sounds are normal.     Palpations: Abdomen is soft.  Musculoskeletal:        General: Normal range of motion.     Cervical back: Normal range of motion.  Skin:    General: Skin is warm and dry.  Neurological:     Mental Status: She is alert and oriented to person, place, and time.     Coordination: Coordination normal.  Psychiatric:        Behavior: Behavior normal. Behavior is cooperative.        Thought Content: Thought content normal.        Judgment: Judgment normal.          Patient has been counseled extensively about nutrition and exercise as well as the importance of adherence with medications and regular follow-up. The patient was given clear instructions to go to ER or return to medical center if symptoms don't improve, worsen or new problems develop. The patient verbalized understanding.   Follow-up: Return in about 3 months (around 02/16/2023).   [name], FNP-BC [facility] and [place] [facility] [place], [place]   11/17/2022, 12:38 PM

## 2022-11-18 LAB — CMP14+EGFR
ALT: 9 IU/L (ref 0–32)
AST: 12 IU/L (ref 0–40)
Albumin/Globulin Ratio: 1.6 (ref 1.2–2.2)
Albumin: 4.4 g/dL (ref 3.8–4.9)
Alkaline Phosphatase: 83 IU/L (ref 44–121)
BUN/Creatinine Ratio: 14 (ref 12–28)
BUN: 9 mg/dL (ref 8–27)
Bilirubin Total: 0.3 mg/dL (ref 0.0–1.2)
CO2: 23 mmol/L (ref 20–29)
Calcium: 9.5 mg/dL (ref 8.7–10.3)
Chloride: 103 mmol/L (ref 96–106)
Creatinine, Ser: 0.63 mg/dL (ref 0.57–1.00)
Globulin, Total: 2.8 g/dL (ref 1.5–4.5)
Glucose: 107 mg/dL — ABNORMAL HIGH (ref 70–99)
Potassium: 4.5 mmol/L (ref 3.5–5.2)
Sodium: 143 mmol/L (ref 134–144)
Total Protein: 7.2 g/dL (ref 6.0–8.5)
eGFR: 101 mL/min/{1.73_m2} (ref >59)

## 2022-11-18 LAB — HEMOGLOBIN A1C
Est. average glucose Bld gHb Est-mCnc: 157 mg/dL
Hgb A1c MFr Bld: 7.1 % — ABNORMAL HIGH (ref 4.8–5.6)

## 2022-11-21 ENCOUNTER — Other Ambulatory Visit: Payer: Self-pay

## 2022-11-21 ENCOUNTER — Other Ambulatory Visit: Payer: Self-pay | Admitting: Nurse Practitioner

## 2022-11-21 MED ORDER — CONTOUR NEXT TEST VI STRP
ORAL_STRIP | 2 refills | Status: DC
  Filled 2022-11-21: qty 100, 50d supply, fill #0
  Filled 2022-11-24: qty 100, fill #0

## 2022-11-24 ENCOUNTER — Other Ambulatory Visit: Payer: Self-pay

## 2022-11-25 ENCOUNTER — Other Ambulatory Visit: Payer: Self-pay | Admitting: Pharmacist

## 2022-11-25 ENCOUNTER — Other Ambulatory Visit: Payer: Self-pay

## 2022-11-25 DIAGNOSIS — E119 Type 2 diabetes mellitus without complications: Secondary | ICD-10-CM

## 2022-11-25 MED ORDER — ONETOUCH VERIO W/DEVICE KIT
PACK | 0 refills | Status: AC
  Filled 2022-11-25: qty 1, 30d supply, fill #0

## 2022-11-25 MED ORDER — ONETOUCH VERIO VI STRP
ORAL_STRIP | 2 refills | Status: DC
  Filled 2022-11-25: qty 50, 30d supply, fill #0
  Filled 2023-07-06: qty 50, 30d supply, fill #1
  Filled 2023-11-08: qty 50, 30d supply, fill #2

## 2022-11-25 MED ORDER — ONETOUCH DELICA LANCETS 33G MISC
2 refills | Status: AC
  Filled 2022-11-25: qty 100, 30d supply, fill #0

## 2022-12-29 ENCOUNTER — Other Ambulatory Visit: Payer: Self-pay

## 2022-12-30 ENCOUNTER — Other Ambulatory Visit: Payer: Self-pay

## 2023-01-26 ENCOUNTER — Other Ambulatory Visit: Payer: Self-pay

## 2023-01-27 ENCOUNTER — Other Ambulatory Visit: Payer: Self-pay

## 2023-01-27 ENCOUNTER — Other Ambulatory Visit (HOSPITAL_COMMUNITY): Payer: Self-pay

## 2023-01-30 ENCOUNTER — Other Ambulatory Visit: Payer: Self-pay

## 2023-02-16 ENCOUNTER — Ambulatory Visit: Payer: Commercial Managed Care - HMO | Attending: Nurse Practitioner | Admitting: Nurse Practitioner

## 2023-02-16 ENCOUNTER — Other Ambulatory Visit: Payer: Self-pay

## 2023-02-16 ENCOUNTER — Encounter: Payer: Self-pay | Admitting: Nurse Practitioner

## 2023-02-16 ENCOUNTER — Other Ambulatory Visit: Payer: Self-pay | Admitting: Nurse Practitioner

## 2023-02-16 VITALS — BP 105/79 | HR 78 | Ht 62.0 in | Wt 152.0 lb

## 2023-02-16 DIAGNOSIS — R7989 Other specified abnormal findings of blood chemistry: Secondary | ICD-10-CM

## 2023-02-16 DIAGNOSIS — Z1231 Encounter for screening mammogram for malignant neoplasm of breast: Secondary | ICD-10-CM

## 2023-02-16 DIAGNOSIS — E785 Hyperlipidemia, unspecified: Secondary | ICD-10-CM | POA: Diagnosis not present

## 2023-02-16 DIAGNOSIS — R350 Frequency of micturition: Secondary | ICD-10-CM

## 2023-02-16 DIAGNOSIS — I10 Essential (primary) hypertension: Secondary | ICD-10-CM | POA: Diagnosis not present

## 2023-02-16 DIAGNOSIS — M545 Low back pain, unspecified: Secondary | ICD-10-CM

## 2023-02-16 DIAGNOSIS — E119 Type 2 diabetes mellitus without complications: Secondary | ICD-10-CM

## 2023-02-16 DIAGNOSIS — G8929 Other chronic pain: Secondary | ICD-10-CM

## 2023-02-16 DIAGNOSIS — F17200 Nicotine dependence, unspecified, uncomplicated: Secondary | ICD-10-CM

## 2023-02-16 DIAGNOSIS — K219 Gastro-esophageal reflux disease without esophagitis: Secondary | ICD-10-CM

## 2023-02-16 DIAGNOSIS — H401131 Primary open-angle glaucoma, bilateral, mild stage: Secondary | ICD-10-CM

## 2023-02-16 DIAGNOSIS — G43009 Migraine without aura, not intractable, without status migrainosus: Secondary | ICD-10-CM

## 2023-02-16 LAB — POCT URINALYSIS DIP (CLINITEK)
Blood, UA: NEGATIVE
Glucose, UA: NEGATIVE mg/dL
Ketones, POC UA: NEGATIVE mg/dL
Leukocytes, UA: NEGATIVE
Nitrite, UA: NEGATIVE
POC PROTEIN,UA: 30 — AB
Spec Grav, UA: >=1.03 — AB (ref 1.010–1.025)
Urobilinogen, UA: 1 E.U./dL
pH, UA: 5.5 (ref 5.0–8.0)

## 2023-02-16 LAB — POCT GLYCOSYLATED HEMOGLOBIN (HGB A1C): Hemoglobin A1C: 7.1 % — AB (ref 4.0–5.6)

## 2023-02-16 MED ORDER — MELOXICAM 7.5 MG PO TABS
7.5000 mg | ORAL_TABLET | Freq: Every day | ORAL | 1 refills | Status: AC
  Filled 2023-02-16: qty 30, 30d supply, fill #0
  Filled 2023-04-07: qty 30, 30d supply, fill #1
  Filled 2023-07-15: qty 30, 30d supply, fill #2

## 2023-02-16 MED ORDER — LATANOPROST 0.005 % OP SOLN
1.0000 [drp] | Freq: Every evening | OPHTHALMIC | 3 refills | Status: DC
  Filled 2023-02-16: qty 2.5, 25d supply, fill #0
  Filled 2023-04-20: qty 2.5, 25d supply, fill #1
  Filled 2023-06-14: qty 2.5, 30d supply, fill #2
  Filled 2023-07-06: qty 2.5, 25d supply, fill #3
  Filled 2023-07-27: qty 2.5, 25d supply, fill #4
  Filled 2023-08-26: qty 2.5, 25d supply, fill #5
  Filled 2023-10-04: qty 2.5, 25d supply, fill #6
  Filled 2023-11-08: qty 2.5, 25d supply, fill #7

## 2023-02-16 MED ORDER — FAMOTIDINE 20 MG PO TABS
20.0000 mg | ORAL_TABLET | Freq: Every day | ORAL | 2 refills | Status: AC
  Filled 2023-02-16: qty 30, 30d supply, fill #0
  Filled 2023-07-15: qty 30, 30d supply, fill #1
  Filled 2023-12-13: qty 30, 30d supply, fill #2

## 2023-02-16 MED ORDER — METFORMIN HCL 500 MG PO TABS
500.0000 mg | ORAL_TABLET | Freq: Every day | ORAL | 1 refills | Status: DC
  Filled 2023-02-16: qty 30, 30d supply, fill #0
  Filled 2023-04-20: qty 30, 30d supply, fill #1
  Filled 2023-06-14: qty 30, 30d supply, fill #2
  Filled 2023-07-15: qty 30, 30d supply, fill #3
  Filled 2023-08-21: qty 30, 30d supply, fill #4

## 2023-02-16 MED ORDER — ROSUVASTATIN CALCIUM 5 MG PO TABS
5.0000 mg | ORAL_TABLET | Freq: Every day | ORAL | 2 refills | Status: DC
  Filled 2023-02-16: qty 30, 30d supply, fill #0
  Filled 2023-04-07: qty 30, 30d supply, fill #1
  Filled 2023-06-14: qty 30, 30d supply, fill #2
  Filled 2023-07-15: qty 30, 30d supply, fill #3
  Filled 2023-08-21: qty 30, 30d supply, fill #4
  Filled 2023-10-06: qty 30, 30d supply, fill #5
  Filled 2023-11-08: qty 30, 30d supply, fill #6

## 2023-02-16 MED ORDER — METHOCARBAMOL 500 MG PO TABS
500.0000 mg | ORAL_TABLET | Freq: Four times a day (QID) | ORAL | 6 refills | Status: AC
  Filled 2023-02-16: qty 90, 23d supply, fill #0

## 2023-02-16 MED ORDER — LISINOPRIL 20 MG PO TABS
20.0000 mg | ORAL_TABLET | Freq: Every day | ORAL | 3 refills | Status: DC
  Filled 2023-02-16 – 2023-02-25 (×2): qty 90, 90d supply, fill #0
  Filled 2023-05-27: qty 90, 90d supply, fill #1
  Filled 2023-08-21: qty 90, 90d supply, fill #2
  Filled 2023-11-08: qty 30, 30d supply, fill #3

## 2023-02-16 MED ORDER — TOPIRAMATE 25 MG PO TABS
25.0000 mg | ORAL_TABLET | Freq: Two times a day (BID) | ORAL | 1 refills | Status: AC
  Filled 2023-02-16: qty 90, 23d supply, fill #0

## 2023-02-16 NOTE — Progress Notes (Signed)
Right elbow and lower back pain  Fatigued  Frequent urination

## 2023-02-16 NOTE — Progress Notes (Signed)
Assessment & Plan:  Mallory Wall was seen today for diabetes.  Diagnoses and all orders for this visit:  Controlled type 2 diabetes mellitus without complication, without long-term current use of insulin -     POCT glycosylated hemoglobin (Hb A1C) -     metFORMIN (GLUCOPHAGE) 500 MG tablet; Take 1 tablet (500 mg total) by mouth daily with breakfast. -     CMP14+EGFR  Essential hypertension -     lisinopril (ZESTRIL) 20 MG tablet; Take 1 tablet (20 mg total) by mouth daily.  Chronic bilateral low back pain without sciatica -     methocarbamol (ROBAXIN) 500 MG tablet; Take 1 tablet (500 mg total) by mouth 4 (four) times daily. -     meloxicam (MOBIC) 7.5 MG tablet; Take 1 tablet (7.5 mg total) by mouth daily. -     Ambulatory referral to Physical Therapy  Dyslipidemia, goal LDL below 70 -     rosuvastatin (CRESTOR) 5 MG tablet; Take 1 tablet (5 mg total) by mouth daily. INSTRUCTIONS: Work on a low fat, heart healthy diet and participate in regular aerobic exercise program by working out at least 150 minutes per week; 5 days a week-30 minutes per day. Avoid red meat/beef/steak,  fried foods. junk foods, sodas, sugary drinks, unhealthy snacking, alcohol and smoking.  Drink at least 80 oz of water per day and monitor your carbohydrate intake daily.    Migraine without aura and without status migrainosus, not intractable Well controlled -     topiramate (TOPAMAX) 25 MG tablet; Take 1-2 tablets (25-50 mg total) by mouth 2 (two) times daily.  GERD without esophagitis Well controlled -     famotidine (PEPCID) 20 MG tablet; Take 1 tablet (20 mg total) by mouth daily.  Increased urinary frequency -     POCT URINALYSIS DIP (CLINITEK)  Tobacco dependence -     CT CHEST LUNG CA SCREEN LOW DOSE W/O CM; Future  Primary open angle glaucoma (POAG) of both eyes, mild stage -     latanoprost (XALATAN) 0.005 % ophthalmic solution; Place 1 drop into both eyes at bedtime. -     Ambulatory referral to  Ophthalmology  Abnormal CBC -     CBC with Differential    Patient has been counseled on age-appropriate routine health concerns for screening and prevention. These are reviewed and up-to-date. Referrals have been placed accordingly. Immunizations are up-to-date or declined.    Subjective:   Chief Complaint  Patient presents with   Diabetes   HPI Mallory Wall 62 y.o. female presents to office today for follow up to DM and HTN  She has a past medical history of Allergy, Back pain, DM2, Gallstones, Hyperlipidemia, and abnormal pap smear   HTN Blood pressure is well controlled. She is taking lisinopril 20 mg daily.  BP Readings from Last 3 Encounters:  02/16/23 105/79  11/17/22 124/85  08/15/22 126/78     DM 2 Increased today since October from 6.6 to 7.1.  She is currently taking metformin 500 mg daily. Weight is up 2 lbs.  Lab Results  Component Value Date   HGBA1C 7.1 (A) 02/16/2023  LDL not at goal with rosuvastatin 5 mg daily . Lab Results  Component Value Date   LDLCALC 88 08/15/2022    Depression She endorses chronic joint pain, chronic fatigue, lack of motivation, wanting to just stay in the bed all day, back pain and arm pain.  She does not have much family support.  02/16/2023   11:15 AM 11/17/2022   10:20 AM 08/15/2022   11:18 AM  Depression screen PHQ 2/9  Decreased Interest 1 0 0  Down, Depressed, Hopeless 0 0 0  PHQ - 2 Score 1 0 0  Altered sleeping 0 0 0  Tired, decreased energy 1 0 0  Change in appetite 0 0 0  Feeling bad or failure about yourself  0 0 0  Trouble concentrating 0 0 0  Moving slowly or fidgety/restless 0 0 0  Suicidal thoughts 0 0 0  PHQ-9 Score 2 0 0  Difficult doing work/chores   Not difficult at all     Back Pain Chronic. Lasts for hours. Intermittent in frequency. Pain is described as stiffness and sometimes radiates down into her legs. Aggravating factors: prolonged sitting.  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    GU Experiencing Urinary frequency and urgency x 2 weeks. Associated symptoms: urinary incontinence Denies flank pain, hematuria, dysuria    Review of Systems  Constitutional:  Negative for fever, malaise/fatigue and weight loss.  HENT: Negative.  Negative for nosebleeds.   Eyes: Negative.  Negative for blurred vision, double vision and photophobia.  Respiratory: Negative.  Negative for cough and shortness of breath.   Cardiovascular: Negative.  Negative for chest pain, palpitations and leg swelling.  Gastrointestinal: Negative.  Negative for heartburn, nausea and vomiting.  Genitourinary:  Positive for frequency and urgency. Negative for dysuria, flank pain and hematuria.  Musculoskeletal: Negative.  Negative for myalgias.  Neurological: Negative.  Negative for dizziness, focal weakness, seizures and headaches.  Psychiatric/Behavioral: Negative.  Negative for suicidal ideas.     Past Medical History:  Diagnosis Date   Allergy    Back pain    Diabetes mellitus without complication    Gallstones    Headache(784.0)    Hyperlipidemia    Vaginal Pap smear, abnormal     Past Surgical History:  Procedure Laterality Date   CHOLECYSTECTOMY N/A 01/13/2018   Procedure: LAPAROSCOPIC CHOLECYSTECTOMY;  Surgeon: Abigail Miyamoto, MD;  Location: [facility];  Service: General;  Laterality: N/A;   NO PAST SURGERIES      Family History  Problem Relation Age of Onset   CAD Mother    Hypertension Mother    Diabetes type II Sister    Lung disease Sister    Colon cancer Neg Hx    Esophageal cancer Neg Hx    Pancreatic cancer Neg Hx    Rectal cancer Neg Hx    Stomach cancer Neg Hx    Breast cancer Neg Hx     Social History Reviewed with no changes to be made today.   Outpatient Medications Prior to Visit  Medication Sig Dispense Refill   acetaminophen (TYLENOL) 325 MG tablet Take 650 mg by mouth every 6 (six) hours as needed.     Blood Glucose  Monitoring Suppl (ONETOUCH VERIO) w/Device KIT Use to check blood sugar once daily. 1 kit 0   glucose blood (ONETOUCH VERIO) test strip Use to check blood sugar once daily. 100 each 2   ondansetron (ZOFRAN-ODT) 8 MG disintegrating tablet Take 1 tablet (8 mg total) by mouth every 8 (eight) hours as needed for nausea or vomiting. 20 tablet 0   OneTouch Delica Lancets 33G MISC Use to check blood sugar once daily. 100 each 2   famotidine (PEPCID) 20 MG tablet TAKE 1 TABLET (20 MG TOTAL) BY MOUTH DAILY. 90 tablet 2   latanoprost (XALATAN) 0.005 %  ophthalmic solution Place 1 drop into both eyes nightly. 5 mL 3   lisinopril (ZESTRIL) 20 MG tablet Take 1 tablet (20 mg total) by mouth daily. 90 tablet 3   metFORMIN (GLUCOPHAGE) 500 MG tablet Take 1 tablet (500 mg total) by mouth daily with breakfast (Please keep upcoming appointment for additional refills) 90 tablet 1   methocarbamol (ROBAXIN) 500 MG tablet Take 1 tablet (500 mg total) by mouth 4 (four) times daily. NEEDS PASS 90 tablet 1   rosuvastatin (CRESTOR) 5 MG tablet Take 1 tablet (5 mg total) by mouth daily. 90 tablet 2   topiramate (TOPAMAX) 25 MG tablet Take 1-2 tablets (25-50 mg total) by mouth 2 (two) times daily. 90 tablet 1   brimonidine-timolol (COMBIGAN) 0.2-0.5 % ophthalmic solution Place 1 drop into both eyes 2 (two) times daily. 5 mL 3   No facility-administered medications prior to visit.    No Known Allergies     Objective:    BP 105/79   Pulse 78   Ht  (1.575 m)   Wt 152 lb (68.9 kg)   SpO2 98%   BMI 27.80 kg/m  Wt Readings from Last 3 Encounters:  02/16/23 152 lb (68.9 kg)  11/17/22 150 lb 12.8 oz (68.4 kg)  08/15/22 150 lb 3.2 oz (68.1 kg)    Physical Exam Vitals and nursing note reviewed.  Constitutional:      Appearance: She is well-developed.  HENT:     Head: Normocephalic and atraumatic.  Cardiovascular:     Rate and Rhythm: Normal rate and regular rhythm.     Heart sounds: Normal heart sounds. No  murmur heard.    No friction rub. No gallop.  Pulmonary:     Effort: Pulmonary effort is normal. No tachypnea or respiratory distress.     Breath sounds: Normal breath sounds. No decreased breath sounds, wheezing, rhonchi or rales.  Chest:     Chest wall: No tenderness.  Abdominal:     General: Bowel sounds are normal.     Palpations: Abdomen is soft.  Musculoskeletal:        General: Normal range of motion.     Cervical back: Normal range of motion.  Skin:    General: Skin is warm and dry.  Neurological:     Mental Status: She is alert and oriented to person, place, and time.     Coordination: Coordination normal.  Psychiatric:        Behavior: Behavior normal. Behavior is cooperative.        Thought Content: Thought content normal.        Judgment: Judgment normal.          Patient has been counseled extensively about nutrition and exercise as well as the importance of adherence with medications and regular follow-up. The patient was given clear instructions to go to ER or return to medical center if symptoms don't improve, worsen or new problems develop. The patient verbalized understanding.   Follow-up: Return in about 3 months (around 05/18/2023).   Claiborne Rigg, FNP-BC [facility] and [facility] [place], [place] [phone]   02/16/2023, 10:41 PM

## 2023-02-17 LAB — CBC WITH DIFFERENTIAL/PLATELET
Basophils Absolute: 0 10*3/uL (ref 0.0–0.2)
Basos: 0 %
EOS (ABSOLUTE): 0.3 10*3/uL (ref 0.0–0.4)
Eos: 5 %
Hematocrit: 40.8 % (ref 34.0–46.6)
Hemoglobin: 13.5 g/dL (ref 11.1–15.9)
Immature Grans (Abs): 0 10*3/uL (ref 0.0–0.1)
Immature Granulocytes: 0 %
Lymphocytes Absolute: 2.7 10*3/uL (ref 0.7–3.1)
Lymphs: 47 %
MCH: 30.1 pg (ref 26.6–33.0)
MCHC: 33.1 g/dL (ref 31.5–35.7)
MCV: 91 fL (ref 79–97)
Monocytes Absolute: 0.5 10*3/uL (ref 0.1–0.9)
Monocytes: 9 %
Neutrophils Absolute: 2.2 10*3/uL (ref 1.4–7.0)
Neutrophils: 39 %
Platelets: 302 10*3/uL (ref 150–450)
RBC: 4.48 x10E6/uL (ref 3.77–5.28)
RDW: 12.8 % (ref 11.7–15.4)
WBC: 5.8 10*3/uL (ref 3.4–10.8)

## 2023-02-17 LAB — CMP14+EGFR
ALT: 12 IU/L (ref 0–32)
AST: 18 IU/L (ref 0–40)
Albumin/Globulin Ratio: 1.4 (ref 1.2–2.2)
Albumin: 4.4 g/dL (ref 3.9–4.9)
Alkaline Phosphatase: 98 IU/L (ref 44–121)
BUN/Creatinine Ratio: 15 (ref 12–28)
BUN: 9 mg/dL (ref 8–27)
Bilirubin Total: 0.2 mg/dL (ref 0.0–1.2)
CO2: 21 mmol/L (ref 20–29)
Calcium: 9.6 mg/dL (ref 8.7–10.3)
Chloride: 100 mmol/L (ref 96–106)
Creatinine, Ser: 0.59 mg/dL (ref 0.57–1.00)
Globulin, Total: 3.2 g/dL (ref 1.5–4.5)
Glucose: 110 mg/dL — ABNORMAL HIGH (ref 70–99)
Potassium: 4.4 mmol/L (ref 3.5–5.2)
Sodium: 142 mmol/L (ref 134–144)
Total Protein: 7.6 g/dL (ref 6.0–8.5)
eGFR: 102 mL/min/{1.73_m2} (ref >59)

## 2023-02-19 ENCOUNTER — Other Ambulatory Visit: Payer: Self-pay

## 2023-02-25 ENCOUNTER — Other Ambulatory Visit (HOSPITAL_COMMUNITY): Payer: Self-pay

## 2023-02-25 ENCOUNTER — Other Ambulatory Visit: Payer: Self-pay

## 2023-03-03 ENCOUNTER — Ambulatory Visit
Admission: RE | Admit: 2023-03-03 | Discharge: 2023-03-03 | Disposition: A | Discharge status: Home / Self Care | Payer: Commercial Managed Care - HMO | Source: Ambulatory Visit | Attending: Nurse Practitioner | Admitting: Nurse Practitioner

## 2023-03-03 DIAGNOSIS — Z1231 Encounter for screening mammogram for malignant neoplasm of breast: Secondary | ICD-10-CM
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
## 2023-03-04 ENCOUNTER — Encounter: Payer: Self-pay | Admitting: Physical Therapy

## 2023-03-04 ENCOUNTER — Other Ambulatory Visit: Payer: Self-pay

## 2023-03-04 ENCOUNTER — Ambulatory Visit: Payer: Commercial Managed Care - HMO | Attending: Nurse Practitioner | Admitting: Physical Therapy

## 2023-03-04 DIAGNOSIS — G8929 Other chronic pain: Secondary | ICD-10-CM | POA: Insufficient documentation

## 2023-03-04 DIAGNOSIS — M545 Low back pain, unspecified: Secondary | ICD-10-CM | POA: Insufficient documentation

## 2023-03-04 DIAGNOSIS — M5459 Other low back pain: Secondary | ICD-10-CM | POA: Diagnosis present

## 2023-03-04 DIAGNOSIS — M6281 Muscle weakness (generalized): Secondary | ICD-10-CM | POA: Diagnosis present

## 2023-03-04 DIAGNOSIS — R2689 Other abnormalities of gait and mobility: Secondary | ICD-10-CM | POA: Diagnosis present

## 2023-03-09 ENCOUNTER — Encounter: Payer: Self-pay | Admitting: Physical Therapy
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
## 2023-03-11 ENCOUNTER — Ambulatory Visit: Payer: Commercial Managed Care - HMO | Admitting: Physical Therapy

## 2023-03-11 ENCOUNTER — Encounter: Payer: Self-pay | Admitting: Physical Therapy

## 2023-03-11 DIAGNOSIS — M5459 Other low back pain: Secondary | ICD-10-CM

## 2023-03-11 DIAGNOSIS — R2689 Other abnormalities of gait and mobility: Secondary | ICD-10-CM

## 2023-03-11 DIAGNOSIS — M6281 Muscle weakness (generalized): Secondary | ICD-10-CM
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
## 2023-03-19 ENCOUNTER — Ambulatory Visit: Payer: Commercial Managed Care - HMO | Admitting: Physical Therapy

## 2023-03-19 ENCOUNTER — Encounter: Payer: Self-pay | Admitting: Physical Therapy

## 2023-03-19 DIAGNOSIS — M6281 Muscle weakness (generalized): Secondary | ICD-10-CM

## 2023-03-19 DIAGNOSIS — M5459 Other low back pain: Secondary | ICD-10-CM
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
## 2023-03-25 ENCOUNTER — Encounter: Payer: Self-pay | Admitting: Physical Therapy

## 2023-03-25 ENCOUNTER — Ambulatory Visit: Payer: Commercial Managed Care - HMO | Admitting: Physical Therapy

## 2023-03-25 DIAGNOSIS — M6281 Muscle weakness (generalized): Secondary | ICD-10-CM

## 2023-03-25 DIAGNOSIS — M5459 Other low back pain: Secondary | ICD-10-CM

## 2023-03-25 DIAGNOSIS — R2689 Other abnormalities of gait and mobility: Secondary | ICD-10-CM

## 2023-03-30 ENCOUNTER — Ambulatory Visit
Admission: RE | Admit: 2023-03-30 | Discharge: 2023-03-30 | Disposition: A | Discharge status: Home / Self Care | Payer: Commercial Managed Care - HMO | Source: Ambulatory Visit | Attending: Nurse Practitioner | Admitting: Nurse Practitioner

## 2023-03-30 DIAGNOSIS — F17200 Nicotine dependence, unspecified, uncomplicated: Secondary | ICD-10-CM

## 2023-04-08 ENCOUNTER — Encounter: Payer: Self-pay | Admitting: Physical Therapy

## 2023-04-08 ENCOUNTER — Ambulatory Visit: Payer: Commercial Managed Care - HMO | Attending: Nurse Practitioner | Admitting: Physical Therapy

## 2023-04-08 DIAGNOSIS — M6281 Muscle weakness (generalized): Secondary | ICD-10-CM | POA: Diagnosis present

## 2023-04-08 DIAGNOSIS — M5459 Other low back pain: Secondary | ICD-10-CM | POA: Diagnosis present

## 2023-04-08 DIAGNOSIS — R2689 Other abnormalities of gait and mobility: Secondary | ICD-10-CM | POA: Insufficient documentation
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
## 2023-04-14 ENCOUNTER — Encounter: Payer: Self-pay | Admitting: Physical Therapy

## 2023-04-14 ENCOUNTER — Ambulatory Visit: Payer: Commercial Managed Care - HMO | Admitting: Physical Therapy

## 2023-04-14 DIAGNOSIS — M5459 Other low back pain: Secondary | ICD-10-CM

## 2023-04-14 DIAGNOSIS — R2689 Other abnormalities of gait and mobility: Secondary | ICD-10-CM

## 2023-04-14 DIAGNOSIS — M6281 Muscle weakness (generalized): Secondary | ICD-10-CM

## 2023-04-15 ENCOUNTER — Encounter: Payer: Commercial Managed Care - HMO | Admitting: Physical Therapy
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
## 2023-04-22 ENCOUNTER — Ambulatory Visit: Payer: Commercial Managed Care - HMO | Admitting: Physical Therapy

## 2023-04-22 ENCOUNTER — Encounter: Payer: Self-pay | Admitting: Physical Therapy

## 2023-04-22 DIAGNOSIS — M6281 Muscle weakness (generalized): Secondary | ICD-10-CM

## 2023-04-22 DIAGNOSIS — R2689 Other abnormalities of gait and mobility: Secondary | ICD-10-CM

## 2023-04-22 DIAGNOSIS — M5459 Other low back pain: Secondary | ICD-10-CM | POA: Diagnosis not present

## 2023-05-26 ENCOUNTER — Other Ambulatory Visit: Payer: Self-pay

## 2023-05-26 ENCOUNTER — Encounter: Payer: Self-pay | Admitting: Nurse Practitioner

## 2023-05-26 ENCOUNTER — Ambulatory Visit: Payer: Commercial Managed Care - HMO | Attending: Nurse Practitioner | Admitting: Nurse Practitioner

## 2023-05-26 VITALS — BP 123/82 | HR 75 | Ht 62.0 in | Wt 154.6 lb

## 2023-05-26 DIAGNOSIS — E119 Type 2 diabetes mellitus without complications: Secondary | ICD-10-CM | POA: Diagnosis not present

## 2023-05-26 DIAGNOSIS — Z7984 Long term (current) use of oral hypoglycemic drugs: Secondary | ICD-10-CM

## 2023-05-26 DIAGNOSIS — M79671 Pain in right foot: Secondary | ICD-10-CM

## 2023-05-26 DIAGNOSIS — M79672 Pain in left foot: Secondary | ICD-10-CM

## 2023-05-26 DIAGNOSIS — F4321 Adjustment disorder with depressed mood: Secondary | ICD-10-CM | POA: Diagnosis not present

## 2023-05-26 DIAGNOSIS — Z5989 Other problems related to housing and economic circumstances: Secondary | ICD-10-CM

## 2023-05-26 LAB — POCT GLYCOSYLATED HEMOGLOBIN (HGB A1C): Hemoglobin A1C: 6.9 % — AB (ref 4.0–5.6)

## 2023-05-26 MED ORDER — DICLOFENAC SODIUM 1 % EX GEL
2.0000 g | Freq: Four times a day (QID) | CUTANEOUS | 1 refills | Status: AC
Start: 2023-05-26 — End: ?
  Filled 2023-05-26: qty 200, 25d supply, fill #0

## 2023-05-26 NOTE — Progress Notes (Signed)
Assessment & Plan:  Mallory Wall was seen today for medical management of chronic issues and diabetes.  Diagnoses and all orders for this visit:  Controlled type 2 diabetes mellitus without complication, without long-term current use of insulin (HCC) -     POCT glycosylated hemoglobin (Hb A1C) -     Cancel: Microalbumin / creatinine urine ratio -     CMP14+EGFR -     Microalbumin / creatinine urine ratio  Situational depression -     Ambulatory referral to Behavioral Health I also recommended she research any potential EAP program through her employer.   Patient has been counseled on age-appropriate routine health concerns for screening and prevention. These are reviewed and up-to-date. Referrals have been placed accordingly. Immunizations are up-to-date or declined.    Subjective:   Chief Complaint  Patient presents with   Medical Management of Chronic Issues   Diabetes   HPI Mallory Wall 62 y.o. female presents to office today for follow-up to diabetes and endorses increased stress and possibly depression.   DM 2 Diabetes is well-controlled today and she endorses adherence taking metformin 500 mg daily as prescribed.  She is also taking an ACE for hypertension and blood pressure is well-controlled. She states the top of her feet burn and pain is worse with prolonged standing. Wants to know if there is a cream she can apply.  Lab Results  Component Value Date   HGBA1C 6.9 (A) 05/26/2023   BP Readings from Last 3 Encounters:  05/26/23 123/82  02/16/23 105/79  11/17/22 124/85      Depression Reports her niece and great nephew has been living with her for almost a year.  She is under a great amount of stress as her niece does not work and Ms. Kaczanowski is responsible for all the bills in the home. She wants her to move out and Ms. Lindgren also is looking for a new place of residence which has been difficult due to her financial situation.      Review of Systems   Constitutional:  Negative for fever, malaise/fatigue and weight loss.  HENT: Negative.  Negative for nosebleeds.   Eyes: Negative.  Negative for blurred vision, double vision and photophobia.  Respiratory: Negative.  Negative for cough and shortness of breath.   Cardiovascular: Negative.  Negative for chest pain, palpitations and leg swelling.  Gastrointestinal: Negative.  Negative for heartburn, nausea and vomiting.  Musculoskeletal: Negative.  Negative for myalgias.  Neurological: Negative.  Negative for dizziness, focal weakness, seizures and headaches.  Psychiatric/Behavioral:  Positive for depression. Negative for suicidal ideas.     Past Medical History:  Diagnosis Date   Allergy    Back pain    Diabetes mellitus without complication (HCC)    Gallstones    Headache(784.0)    Hyperlipidemia    Vaginal Pap smear, abnormal     Past Surgical History:  Procedure Laterality Date   CHOLECYSTECTOMY N/A 01/13/2018   Procedure: LAPAROSCOPIC CHOLECYSTECTOMY;  Surgeon: Abigail Miyamoto, MD;  Location: [facility];  Service: General;  Laterality: N/A;   NO PAST SURGERIES      Family History  Problem Relation Age of Onset   CAD Mother    Hypertension Mother    Diabetes type II Sister    Lung disease Sister    Colon cancer Neg Hx    Esophageal cancer Neg Hx    Pancreatic cancer Neg Hx    Rectal cancer Neg Hx    Stomach cancer Neg  Hx    Breast cancer Neg Hx     Social History Reviewed with no changes to be made today.   Outpatient Medications Prior to Visit  Medication Sig Dispense Refill   Blood Glucose Monitoring Suppl (ONETOUCH VERIO) w/Device KIT Use to check blood sugar once daily. 1 kit 0   famotidine (PEPCID) 20 MG tablet Take 1 tablet (20 mg total) by mouth daily. 90 tablet 2   glucose blood (ONETOUCH VERIO) test strip Use to check blood sugar once daily. 100 each 2   latanoprost (XALATAN) 0.005 % ophthalmic solution Place 1 drop into both eyes at  bedtime. 5 mL 3   lisinopril (ZESTRIL) 20 MG tablet Take 1 tablet (20 mg total) by mouth daily. 90 tablet 3   meloxicam (MOBIC) 7.5 MG tablet Take 1 tablet (7.5 mg total) by mouth daily. 90 tablet 1   metFORMIN (GLUCOPHAGE) 500 MG tablet Take 1 tablet (500 mg total) by mouth daily with breakfast. 90 tablet 1   methocarbamol (ROBAXIN) 500 MG tablet Take 1 tablet (500 mg total) by mouth 4 (four) times daily. 90 tablet 6   OneTouch Delica Lancets 33G MISC Use to check blood sugar once daily. 100 each 2   rosuvastatin (CRESTOR) 5 MG tablet Take 1 tablet (5 mg total) by mouth daily. 90 tablet 2   topiramate (TOPAMAX) 25 MG tablet Take 1-2 tablets (25-50 mg total) by mouth 2 (two) times daily. 90 tablet 1   acetaminophen (TYLENOL) 325 MG tablet Take 650 mg by mouth every 6 (six) hours as needed. (Patient not taking: Reported on 05/26/2023)     ondansetron (ZOFRAN-ODT) 8 MG disintegrating tablet Take 1 tablet (8 mg total) by mouth every 8 (eight) hours as needed for nausea or vomiting. (Patient not taking: Reported on 05/26/2023) 20 tablet 0   No facility-administered medications prior to visit.    No Known Allergies     Objective:    BP 123/82 (BP Location: Left Arm, Patient Position: Sitting, Cuff Size: Normal)   Pulse 75   Ht 5\' 2"  (1.575 m)   Wt 154 lb 9.6 oz (70.1 kg)   SpO2 100%   BMI 28.28 kg/m  Wt Readings from Last 3 Encounters:  05/26/23 154 lb 9.6 oz (70.1 kg)  02/16/23 152 lb (68.9 kg)  11/17/22 150 lb 12.8 oz (68.4 kg)    Physical Exam Vitals and nursing note reviewed.  Constitutional:      Appearance: She is well-developed.  HENT:     Head: Normocephalic and atraumatic.  Cardiovascular:     Rate and Rhythm: Normal rate and regular rhythm.     Heart sounds: Normal heart sounds. No murmur heard.    No friction rub. No gallop.  Pulmonary:     Effort: Pulmonary effort is normal. No tachypnea or respiratory distress.     Breath sounds: Normal breath sounds. No decreased  breath sounds, wheezing, rhonchi or rales.  Chest:     Chest wall: No tenderness.  Abdominal:     General: Bowel sounds are normal.     Palpations: Abdomen is soft.  Musculoskeletal:        General: Normal range of motion.     Cervical back: Normal range of motion.  Skin:    General: Skin is warm and dry.  Neurological:     Mental Status: She is alert and oriented to person, place, and time.     Coordination: Coordination normal.  Psychiatric:  Mood and Affect: Mood is depressed. Affect is tearful.        Behavior: Behavior normal. Behavior is cooperative.        Thought Content: Thought content normal.        Judgment: Judgment normal.          Patient has been counseled extensively about nutrition and exercise as well as the importance of adherence with medications and regular follow-up. The patient was given clear instructions to go to ER or return to medical center if symptoms don't improve, worsen or new problems develop. The patient verbalized understanding.   Follow-up: Return in about 3 months (around 08/26/2023).   Claiborne Rigg, FNP-BC [facility] [place], [place] [phone]   05/26/2023, 11:53 AM

## 2023-05-27 ENCOUNTER — Other Ambulatory Visit: Payer: Self-pay

## 2023-06-15 ENCOUNTER — Other Ambulatory Visit: Payer: Self-pay

## 2023-07-02 LAB — HM DIABETES EYE EXAM

## 2023-07-07 ENCOUNTER — Other Ambulatory Visit: Payer: Self-pay

## 2023-07-08 ENCOUNTER — Other Ambulatory Visit: Payer: Self-pay

## 2023-07-14 ENCOUNTER — Other Ambulatory Visit: Payer: Self-pay

## 2023-07-21 ENCOUNTER — Other Ambulatory Visit (HOSPITAL_COMMUNITY): Payer: Self-pay

## 2023-07-27 ENCOUNTER — Ambulatory Visit (HOSPITAL_BASED_OUTPATIENT_CLINIC_OR_DEPARTMENT_OTHER): Payer: Commercial Managed Care - HMO | Admitting: Family

## 2023-07-27 ENCOUNTER — Other Ambulatory Visit: Payer: Self-pay

## 2023-07-27 ENCOUNTER — Encounter (HOSPITAL_COMMUNITY): Payer: Self-pay | Admitting: Family

## 2023-07-27 VITALS — BP 127/85 | HR 75 | Ht 62.0 in | Wt 150.0 lb

## 2023-07-27 DIAGNOSIS — F4321 Adjustment disorder with depressed mood: Secondary | ICD-10-CM | POA: Diagnosis not present

## 2023-07-27 NOTE — Progress Notes (Signed)
Psychiatric Initial Adult Assessment   Patient Identification: Mallory Wall MRN:  [id number] Date of Evaluation:  07/27/2023 Referral Source: Tawanna Cooler, NP Chief Complaint:  " It does not feel like my family loves me."  Visit Diagnosis:    ICD-10-CM   1. Situational depression  F43.21     2. Unresolved grief  F43.21       History of Present Illness: Mallory Wall 62 year old African-American female presents to establish care.  States she was referred by her primary care provider due to worsening depression symptoms.  States struggling with low energy, mood irritability, memory issues.  States " I just do not feel well by my family."  She reports her symptoms started after the passing of her mother.  States her mother passed away in Aug 26, 2021.  States she then lost her sister and Aug 26, 2017.  Stated she was closest to this family members.  States she does still have another living sister and 2 brothers however they are not as close.  Reports her main support system both her mother and her sister who passed away.  Mallory Wall states she recently had a her younger niece, reside with her, because she did not have anywhere else to go.  States her younger niece and her son is in the household with her.  States since she moved in roughly 1 year ago she has noticed her mood and depression has worsened.  States that she had requested that her niece move out however she has not as of yet. "  I just need her to move out."  Reports relationship is strained and she walks around on eggshells in her own home.  States she self isolates to her room.  She denied history related to mental illness.  Denied that she is ever been followed by therapy or psychiatry in the past.  Denied that she is followed up with grief from loss after the passing of her mother and sister.  Denied that she is prescribed any psychotropic medications.  Reports a fair appetite.  States she rests well at night.  She reports a history related  to glaucoma, arthritis, hypertension and elevated cholesterol.  States she is prescribed metformin, atorvastatin and lisinopril.  She reports taking and tolerating her medications well.  PHQ-9 9 GAD-7 3.    Patient asks if her knees were to move out water symptoms improved patient stated "yes" declined initiating any medications at this time.  Discussed consideration for possibly low-dose SSRI however, patient to follow-up with therapy services.  During evaluation Mallory Wall is sitting; she is alert/oriented x 4; calm/cooperative; and mood congruent with affect.  Patient is speaking in a clear tone at moderate volume, and normal pace; with good eye contact. Her thought process is coherent and relevant; There is no indication that she is currently responding to internal/external stimuli or experiencing delusional thought content.  Patient denies suicidal/self-harm/homicidal ideation, psychosis, and paranoia.  Patient has remained calm throughout assessment and has answered questions appropriately    Associated Signs/Symptoms: Depression Symptoms:  depressed mood, feelings of worthlessness/guilt, difficulty concentrating, (Hypo) Manic Symptoms:  Distractibility, Anxiety Symptoms:  Excessive Worry, Psychotic Symptoms:  Hallucinations: None PTSD Symptoms: NA  Past Psychiatric History: See HP  Previous Psychotropic Medications: No   Substance Abuse History in the last 12 months:  No.  Consequences of Substance Abuse: NA  Past Medical History:  Past Medical History:  Diagnosis Date   Allergy    Back pain    Diabetes mellitus without complication (HCC)  Gallstones    Headache(784.0)    Hyperlipidemia    Vaginal Pap smear, abnormal     Past Surgical History:  Procedure Laterality Date   CHOLECYSTECTOMY N/A 01/13/2018   Procedure: LAPAROSCOPIC CHOLECYSTECTOMY;  Surgeon: Abigail Miyamoto, MD;  Location: [facility];  Service: General;  Laterality: N/A;   NO PAST  SURGERIES      Family Psychiatric History:   Family History:  Family History  Problem Relation Age of Onset   CAD Mother    Hypertension Mother    Diabetes type II Sister    Lung disease Sister    Colon cancer Neg Hx    Esophageal cancer Neg Hx    Pancreatic cancer Neg Hx    Rectal cancer Neg Hx    Stomach cancer Neg Hx    Breast cancer Neg Hx     Social History:   Social History   Socioeconomic History   Marital status: Single    Spouse name: Not on file   Number of children: 0   Years of education: Not on file   Highest education level: High school graduate  Occupational History   Not on file  Tobacco Use   Smoking status: Every Day    Current packs/day: 1.00    Average packs/day: 1 pack/day for 49.0 years (49.0 ttl pk-yrs)    Types: Cigarettes   Smokeless tobacco: Never  Vaping Use   Vaping status: Never Used  Substance and Sexual Activity   Alcohol use: Not Currently   Drug use: No   Sexual activity: Not Currently    Birth control/protection: Post-menopausal  Other Topics Concern   Not on file  Social History Narrative   Works at [employer]   Social Determinants of Corporate investment banker Strain: Not on file  Food Insecurity: No Food Insecurity (02/25/2022)   Hunger Vital Sign    Worried About Running Out of Food in the Last Year: Never true    Ran Out of Food in the Last Year: Never true  Transportation Needs: No Transportation Needs (02/25/2022)   PRAPARE - Administrator, Civil Service (Medical): No    Lack of Transportation (Non-Medical): No  Physical Activity: Not on file  Stress: Not on file  Social Connections: Not on file    Additional Social History:   Allergies:  No Known Allergies  Metabolic Disorder Labs: Lab Results  Component Value Date   HGBA1C 6.9 (A) 05/26/2023   MPG 148 02/12/2016   MPG 140 (H) 11/24/2014   No results found for: "PROLACTIN" Lab Results  Component Value Date   CHOL 146 08/15/2022    TRIG 49 08/15/2022   HDL 47 08/15/2022   CHOLHDL 3.1 08/15/2022   VLDL 11 02/12/2016   LDLCALC 88 08/15/2022   LDLCALC 71 01/13/2022   Lab Results  Component Value Date   TSH 0.680 08/15/2022    Therapeutic Level Labs: No results found for: "LITHIUM" No results found for: "CBMZ" No results found for: "VALPROATE"  Current Medications: Current Outpatient Medications  Medication Sig Dispense Refill   Blood Glucose Monitoring Suppl (ONETOUCH VERIO) w/Device KIT Use to check blood sugar once daily. 1 kit 0   diclofenac Sodium (VOLTAREN) 1 % GEL Apply 2 g topically 4 (four) times daily. 200 g 1   famotidine (PEPCID) 20 MG tablet Take 1 tablet (20 mg total) by mouth daily. 90 tablet 2   glucose blood (ONETOUCH VERIO) test strip Use to check blood sugar  once daily. 100 each 2   latanoprost (XALATAN) 0.005 % ophthalmic solution Place 1 drop into both eyes at bedtime. 5 mL 3   lisinopril (ZESTRIL) 20 MG tablet Take 1 tablet (20 mg total) by mouth daily. 90 tablet 3   meloxicam (MOBIC) 7.5 MG tablet Take 1 tablet (7.5 mg total) by mouth daily. 90 tablet 1   metFORMIN (GLUCOPHAGE) 500 MG tablet Take 1 tablet (500 mg total) by mouth daily with breakfast. 90 tablet 1   methocarbamol (ROBAXIN) 500 MG tablet Take 1 tablet (500 mg total) by mouth 4 (four) times daily. 90 tablet 6   OneTouch Delica Lancets 33G MISC Use to check blood sugar once daily. 100 each 2   rosuvastatin (CRESTOR) 5 MG tablet Take 1 tablet (5 mg total) by mouth daily. 90 tablet 2   topiramate (TOPAMAX) 25 MG tablet Take 1-2 tablets (25-50 mg total) by mouth 2 (two) times daily. 90 tablet 1   acetaminophen (TYLENOL) 325 MG tablet Take 650 mg by mouth every 6 (six) hours as needed. (Patient not taking: Reported on 05/26/2023)     ondansetron (ZOFRAN-ODT) 8 MG disintegrating tablet Take 1 tablet (8 mg total) by mouth every 8 (eight) hours as needed for nausea or vomiting. (Patient not taking: Reported on 05/26/2023) 20 tablet 0    No current facility-administered medications for this visit.    Musculoskeletal: Strength & Muscle Tone: within normal limits Gait & Station: normal Patient leans: N/A  Psychiatric Specialty Exam: Review of Systems  Psychiatric/Behavioral:  Positive for decreased concentration. Negative for sleep disturbance and suicidal ideas. The patient is not nervous/anxious.   All other systems reviewed and are negative.   Blood pressure 127/85, pulse 75, height 5\' 2"  (1.575 m), weight 150 lb (68 kg).Body mass index is 27.44 kg/m.  General Appearance: Casual  Eye Contact:  Good  Speech:  Clear and Coherent  Volume:  Normal  Mood:  Depressed  Affect:  Congruent  Thought Process:  Coherent  Orientation:  Full (Time, Place, and Person)  Thought Content:  Logical  Suicidal Thoughts:  No  Homicidal Thoughts:  No  Memory:  Immediate;   Fair Recent;   Fair  Judgement:  Fair  Insight:  Good  Psychomotor Activity:  Normal  Concentration:  Concentration: Good  Recall:  Good  Fund of Knowledge:Good  Language: Good  Akathisia:  No  Handed:  Right  AIMS (if indicated):  not done  Assets:  Communication Skills Desire for Improvement Leisure Time Social Support  ADL's:  Intact  Cognition: WNL  Sleep:  Good   Screenings: GAD-7    Garment/textile technologist Visit from 05/26/2023 in [facility] Office Visit from 02/16/2023 in [facility] Office Visit from 11/17/2022 in [facility] Office Visit from 08/15/2022 in [facility] Office Visit from 05/14/2022 in [facility]  Total GAD-7 Score 3 1 2  0 0      PHQ2-9    Flowsheet Row [facility]20[facility] Office Visit from 05/26/2023 in [facility] Office Visit from 02/16/2023 in [facility] Office Visit from 11/17/2022 in [facility] Office Visit from 08/15/2022 in [facility]  PHQ-2 Total Score 2 2 1  0 0  PHQ-9 Total Score 9 3 2  0 0      Flowsheet Row Office Visit from 07/27/2023 in [facility] from 06/25/2022 in [facility] Emergency [facility] at [facility]  C-SSRS RISK CATEGORY Error: Q3, 4, or 5 should not be populated when Q2 is No No Risk       Assessment and Plan: Mallory Wall 62 year old African-American female presents to establish care.  States she has been struggling with depression, mood irritability and low energy for over 2+ years.  States her symptoms started after the passing of her mother.  Reports more recently she is felt like she has been taken advantage of by her extended family.  Reports she currently has a niece residing with her and has requested that she moved out of her home. "  She is draining."  Mallory Wall denied illicit drug use or substance abuse history.  Denied previous inpatient admissions or that she has been followed by psychiatry services in the past.  Patient opted to initiate therapy services first.   Collaboration of Care: Medication Management AEB discussed consideration for initiating Zoloft 25 to 50 mg daily she declined at this time.  Patient to follow-up with therapy service consider grief and loss counseling  Patient/Guardian was advised Release of Information must be obtained prior to any record release in order to collaborate their care with an outside provider. Patient/Guardian was advised if they have not already done so to contact the registration department to sign all necessary forms in order for [place] to release information regarding their care.   Consent: Patient/Guardian gives verbal consent for treatment and assignment of benefits for services provided during this visit.  Patient/Guardian expressed understanding and agreed to proceed.   Oneta Rack, NP 9/30/20241:03 PM

## 2023-07-29 ENCOUNTER — Other Ambulatory Visit: Payer: Self-pay

## 2023-08-05 ENCOUNTER — Other Ambulatory Visit: Payer: Self-pay

## 2023-08-13 ENCOUNTER — Encounter (HOSPITAL_COMMUNITY): Payer: Self-pay

## 2023-08-13 ENCOUNTER — Ambulatory Visit (INDEPENDENT_AMBULATORY_CARE_PROVIDER_SITE_OTHER): Payer: 59 | Admitting: Licensed Clinical Social Worker

## 2023-08-13 DIAGNOSIS — F4321 Adjustment disorder with depressed mood: Secondary | ICD-10-CM | POA: Diagnosis not present

## 2023-08-13 DIAGNOSIS — F4323 Adjustment disorder with mixed anxiety and depressed mood: Secondary | ICD-10-CM

## 2023-08-13 NOTE — Progress Notes (Signed)
Comprehensive Clinical Assessment (CCA) Note  08/13/2023 Mallory Wall [id number]  Chief Complaint:  Chief Complaint  Patient presents with   Depression    Pt reports experiencing persistent depressive sxs in relation to grief and loss of sister and mother since their passing in 2002 and 2018. Sxs include changes in energy/activity, mood lability, tearfulness, irritability, and decreased appetite.   Anxiety    Pt reports experiencing increased worry surrounding typical daily stressors, the relationships she has with family members, and worries specific to employment and financial stability between both jobs. Pt reports sxs of worrying, irritability and tension.   Visit Diagnosis: Unresolved grief  Adjustment disorder with mixed anxiety and depressed mood      08/13/2023    8:20 AM 05/26/2023    9:37 AM 02/16/2023   11:16 AM 11/17/2022   10:21 AM  GAD 7 : Generalized Anxiety Score  Nervous, Anxious, on Edge 1 0 0 0  Control/stop worrying 3 0 0 1  Worry too much - different things 3 1 0 1  Trouble relaxing 0 1 0 0  Restless 0 0 0 0  Easily annoyed or irritable 3 1 1  0  Afraid - awful might happen 0 0 0 0  Total GAD 7 Score 10 3 1 2   Anxiety Difficulty Somewhat difficult Not difficult at all         08/13/2023    8:23 AM 07/27/2023    1:02 PM 05/26/2023    9:37 AM 02/16/2023   11:15 AM 11/17/2022   10:20 AM  Depression screen PHQ 2/9  Decreased Interest 0 1 1 1  0  Down, Depressed, Hopeless 1 1 1  0 0  PHQ - 2 Score 1 2 2 1  0  Altered sleeping 0 1 0 0 0  Tired, decreased energy 0 1 1 1  0  Change in appetite 1 1 0 0 0  Feeling bad or failure about yourself  0 1 0 0 0  Trouble concentrating 0 1 0 0 0  Moving slowly or fidgety/restless 0 1 0 0 0  Suicidal thoughts 0 1 0 0 0  PHQ-9 Score 2 9 3 2  0  Difficult doing work/chores Not difficult at all Not difficult at all Not difficult at all     Summary: Mallory Wall is a 62yo African American, single female, with no prior psych  hx, presenting for initial CCA, referred by PCP, due to prolonged depressive sxs surrounding grief and loss of mother and sister, and the estranged relationship with all family members. Stressors include current work schedule challenges, home environment impacting mood with family member residing with pt, unresolved grief surrounding the loss of mother in 2002 and sister in 2018, and limited social supports outside of one close peer. Sxs include changes in energy level/activity, fatigue, irritability, decreased appetite, tearfulness, increased worrying, and tension. Pt denies SI, HI, AVH. Pt denies any hx of prior tx. Pt will benefit from continued engagement in bi-weekly OPT services in efforts to effectively manage presenting sxs.   CCA Biopsychosocial Intake/Chief Complaint:  "I guess being unhappy, I think the reason I was referred here was I think my doctor saw it in me. I wasn't that happy-go-lucky person. She asked what was wrong and I couldn't tell her, I don't know. It's just been an ongoing thing. One minute I'm happy and the next I'm sad. I think it's a lot to do with my mom and my sister passing"  Current Symptoms/Problems: "Decreased appetite, irritability, changes energy and activity level  sometimes. Sometimes I'll think about them and just start crying, depressed mood. Irritated by sitation at home with neice living with me now and I really want them out my house"   Patient Reported Schizophrenia/Schizoaffective Diagnosis in Past: No   Strengths: "Cooking, good at my job, care about others, love my family, considerate." Stable housing, support via friends and some family.  Preferences: Pt reports she would prefer to continue therapy bi-weekly, in-person. Attempt CBT prior to exploring any medication options.  Abilities: Open to learning new skills.   Type of Services Patient Feels are Needed: Requesting to continue OPT bi-weekly.   Initial Clinical Notes/Concerns: Pt is a 62yo  African American single female, referred by PCP due to prolonged depressive sxs surrounding unresolved grief/loss of mother in 2002 and sister in 2018. Pt reports increased worry surrounding financial stress and changes within workplace and limited work hours scheduled.   Mental Health Symptoms Depression:   Change in energy/activity; Fatigue; Irritability; Increase/decrease in appetite; Weight gain/loss; Tearfulness   Duration of Depressive symptoms:  Greater than two weeks   Mania:   None   Anxiety:    Fatigue; Worrying; Irritability; Tension   Psychosis:  No data recorded  Duration of Psychotic symptoms: No data recorded  Trauma:   Emotional numbing; Detachment from others   Obsessions:   None   Compulsions:  No data recorded  Inattention:   None   Hyperactivity/Impulsivity:   None   Oppositional/Defiant Behaviors:   None   Emotional Irregularity:   Mood lability   Other Mood/Personality Symptoms:  No data recorded   Mental Status Exam Appearance and self-care  Stature:   Average   Weight:   Average weight   Clothing:   Casual   Grooming:   Normal   Cosmetic use:   Age appropriate   Posture/gait:   Normal   Motor activity:   Not Remarkable   Sensorium  Attention:   Normal   Concentration:   Normal   Orientation:   X5   Recall/memory:   Normal   Affect and Mood  Affect:   Flat   Mood:   Depressed   Relating  Eye contact:   Normal   Facial expression:   Responsive   Attitude toward examiner:   Cooperative   Thought and Language  Speech flow:  Clear and Coherent   Thought content:   Appropriate to Mood and Circumstances   Preoccupation:   None   Hallucinations:   None   Organization:  No data recorded  [employer] of Knowledge:   Average   Intelligence:  No data recorded  Abstraction:   Normal   Judgement:   Normal   Reality Testing:   Adequate   Insight:   Good   Decision Making:    Normal   Social Functioning  Social Maturity:   Responsible   Social Judgement:   Normal   Stress  Stressors:   Grief/losses; Relationship; Family conflict; Work; Housing   Coping Ability:   Normal   Skill Deficits:   None   Supports:   Friends/Service system     Religion: Religion/Spirituality Are You A Religious Person?: No  Leisure/Recreation: Leisure / Recreation Do You Have Hobbies?: Yes Leisure and Hobbies: "I like to shop, I like to eat, I like going to the movies"  Exercise/Diet: Exercise/Diet Do You Exercise?: Yes What Type of Exercise Do You Do?: Run/Walk How Many Times a Week Do You Exercise?: 1-3 times a week Have  You Gained or Lost A Significant Amount of Weight in the Past Six Months?: No Do You Follow a Special Diet?: No Do You Have Any Trouble Sleeping?: No   CCA Employment/Education Employment/Work Situation: Employment / Work Situation Employment Situation: Employed Where is Patient Currently Employed?: [employer] How Long has Patient Been Employed?: 20 years Are You Satisfied With Your Job?: No Do You Work More Than One Job?: Yes Work Stressors: "I just feel like they just look over [place] as the regular employees and give all the hours to people that aren't deserving. Since the change came about I feel they put [place] on the back burner" Patient's Job has Been Impacted by Current Illness: No What is the Longest Time Patient has Held a Job?: 20 years Where was the Patient Employed at that Time?: [employer] Has Patient ever Been in the [employer]?: No  Education: Education Is Patient Currently Attending School?: No Last Grade Completed: 12 Name of High School: [employer] Did Garment/textile technologist From [employer]?: Yes Did You Attend College?: No Did You Attend Graduate School?: No Did You Have An Individualized Education Program (IIEP): No Did You Have Any Difficulty At  School?: No Patient's Education Has Been Impacted by Current Illness: No   CCA Family/Childhood History Family and Relationship History: Family history Marital status: Single Are you sexually active?: Yes What is your sexual orientation?: Heterosexual Does patient have children?: No  Childhood History:  Childhood History By whom was/is the patient raised?: Mother Additional childhood history information: "No involvement from father." Description of patient's relationship with caregiver when they were a child: "Wonderful" Patient's description of current relationship with people who raised him/her: Mother deceased 09/01/01 How were you disciplined when you got in trouble as a child/adolescent?: "I got whoopings. I wasn't that bad" Does patient have siblings?: Yes Number of Siblings: 4 (3 deceased siblings (1 sister - 01-Sep-2017, 2 brothers - 64, 95); 80yo sister still living.) Description of patient's current relationship with siblings: "It's okay, she's local. I don't see her now. Last saw March 12th. Talk on the phone, I call her every other day to check on her" Did patient suffer any verbal/emotional/physical/sexual abuse as a child?: No Did patient suffer from severe childhood neglect?: No Has patient ever been sexually abused/assaulted/raped as an adolescent or adult?: No Was the patient ever a victim of a crime or a disaster?: No Witnessed domestic violence?: No Has patient been affected by domestic violence as an adult?: No  DSM5 Diagnoses: Patient Active Problem List   Diagnosis Date Noted   Unresolved grief 08/13/2023   Adjustment disorder with mixed anxiety and depressed mood 08/13/2023   Well woman exam with routine gynecological exam 11/29/2019   Epistaxis, recurrent 07/14/2018   Rhinitis, chronic 07/14/2018   Diabetes mellitus without complication (HCC) 07/06/2018   Dysplasia of cervix, low grade (CIN 1) 03/25/2016   Essential hypertension 01/30/2014   DDD (degenerative  disc disease), lumbar 03/18/2013   History of pyelonephritis 03/18/2013   Dyslipidemia 03/18/2013    Patient Centered Plan: Patient is on the following Treatment Plan(s):  Anxiety and Depression  Collaboration of Care: Other None necessary at this time.  Patient/Guardian was advised Release of Information must be obtained prior to any record release in order to collaborate their care with an outside provider. Patient/Guardian was advised if they have not already done so to contact the registration department to sign all necessary forms in order for [place]  to release information regarding their care.   Consent: Patient/Guardian gives verbal consent for treatment and assignment of benefits for services provided during this visit. Patient/Guardian expressed understanding and agreed to proceed.   Leisa Lenz, LCSW

## 2023-08-26 ENCOUNTER — Ambulatory Visit: Payer: Managed Care, Other (non HMO) | Attending: Nurse Practitioner | Admitting: Nurse Practitioner

## 2023-08-26 ENCOUNTER — Encounter: Payer: Self-pay | Admitting: Nurse Practitioner

## 2023-08-26 VITALS — BP 142/86 | HR 67 | Ht 62.0 in | Wt 154.2 lb

## 2023-08-26 DIAGNOSIS — I10 Essential (primary) hypertension: Secondary | ICD-10-CM | POA: Diagnosis not present

## 2023-08-26 DIAGNOSIS — M25512 Pain in left shoulder: Secondary | ICD-10-CM | POA: Diagnosis not present

## 2023-08-26 NOTE — Progress Notes (Signed)
Mallory Wall was seen today for medical management of chronic issues and arm pain.  Diagnoses and all orders for this visit:  Primary hypertension Continue lisinopril antihypertensives as prescribed.  Reminded to bring in blood pressure log for follow  up appointment.  RECOMMENDATIONS: DASH/Mediterranean Diets are healthier choices for HTN.    Acute pain of left shoulder -     DG Shoulder Left; Future    Subjective:   Chief Complaint  Patient presents with   Medical Management of Chronic Issues        Arm Pain    Left upper arm pain .     Mallory Wall 62 y.o. female presents to office today for follow up to HTN.  She has a past medical history of Allergy, Back pain, DM2, Gallstones, Hyperlipidemia, and abnormal pap smear  Patient has been counseled on age-appropriate routine health concerns for screening and prevention. These are reviewed and up-to-date. Referrals have been placed accordingly. Immunizations are up-to-date or declined.     Mammogram: Up-to-date Colon cancer screening: Up-to-date Pap smear: Up-to-date   HTN Blood pressure is slightly elevated today. She is taking lisinopril 20 mg daily.  BP Readings from Last 3 Encounters:  08/26/23 (!) 142/86  05/26/23 123/82  02/16/23 105/79     Joint/Muscle Pain: Patient complains of myalgias for which has been present for a few months. Pain is located in the left shoulder(s), is described as  stiffness and aching , and is constant .  Associated symptoms include: decreased range of motion.  The patient has tried goody powders for pain, with no relief and voltaren gel with some relief of pain. Related to injury:   no.    Review of Systems  Constitutional:  Negative for fever, malaise/fatigue and weight loss.  HENT: Negative.  Negative for nosebleeds.   Eyes: Negative.  Negative for blurred vision, double vision and photophobia.  Respiratory: Negative.  Negative for cough and shortness of breath.   Cardiovascular:  Negative.  Negative for chest pain, palpitations and leg swelling.  Gastrointestinal: Negative.  Negative for heartburn, nausea and vomiting.  Musculoskeletal:  Positive for joint pain and myalgias.  Neurological: Negative.  Negative for dizziness, focal weakness, seizures and headaches.  Psychiatric/Behavioral: Negative.  Negative for suicidal ideas.     Past Medical History:  Diagnosis Date   Allergy    Back pain    Diabetes mellitus without complication (HCC)    Gallstones    Headache(784.0)    Hyperlipidemia    Vaginal Pap smear, abnormal     Past Surgical History:  Procedure Laterality Date   CHOLECYSTECTOMY N/A 01/13/2018   Procedure: LAPAROSCOPIC CHOLECYSTECTOMY;  Surgeon: Abigail Miyamoto, MD;  Location: [facility];  Service: General;  Laterality: N/A;   NO PAST SURGERIES      Family History  Problem Relation Age of Onset   CAD Mother    Hypertension Mother    Diabetes type II Sister    Lung disease Sister    Colon cancer Neg Hx    Esophageal cancer Neg Hx    Pancreatic cancer Neg Hx    Rectal cancer Neg Hx    Stomach cancer Neg Hx    Breast cancer Neg Hx     Social History Reviewed with no changes to be made today.   Outpatient Medications Prior to Visit  Medication Sig Dispense Refill   Blood Glucose Monitoring Suppl (ONETOUCH VERIO) w/Device KIT Use to check blood sugar once daily. 1 kit 0  diclofenac Sodium (VOLTAREN) 1 % GEL Apply 2 g topically 4 (four) times daily. 200 g 1   famotidine (PEPCID) 20 MG tablet Take 1 tablet (20 mg total) by mouth daily. 90 tablet 2   glucose blood (ONETOUCH VERIO) test strip Use to check blood sugar once daily. 100 each 2   latanoprost (XALATAN) 0.005 % ophthalmic solution Place 1 drop into both eyes at bedtime. 5 mL 3   lisinopril (ZESTRIL) 20 MG tablet Take 1 tablet (20 mg total) by mouth daily. 90 tablet 3   meloxicam (MOBIC) 7.5 MG tablet Take 1 tablet (7.5 mg total) by mouth daily. 90 tablet 1    metFORMIN (GLUCOPHAGE) 500 MG tablet Take 1 tablet (500 mg total) by mouth daily with breakfast. 90 tablet 1   methocarbamol (ROBAXIN) 500 MG tablet Take 1 tablet (500 mg total) by mouth 4 (four) times daily. 90 tablet 6   OneTouch Delica Lancets 33G MISC Use to check blood sugar once daily. 100 each 2   rosuvastatin (CRESTOR) 5 MG tablet Take 1 tablet (5 mg total) by mouth daily. 90 tablet 2   topiramate (TOPAMAX) 25 MG tablet Take 1-2 tablets (25-50 mg total) by mouth 2 (two) times daily. 90 tablet 1   acetaminophen (TYLENOL) 325 MG tablet Take 650 mg by mouth every 6 (six) hours as needed. (Patient not taking: Reported on 05/26/2023)     ondansetron (ZOFRAN-ODT) 8 MG disintegrating tablet Take 1 tablet (8 mg total) by mouth every 8 (eight) hours as needed for nausea or vomiting. (Patient not taking: Reported on 05/26/2023) 20 tablet 0   No facility-administered medications prior to visit.    No Known Allergies     Objective:    BP (!) 142/86 (BP Location: Left Arm, Patient Position: Sitting, Cuff Size: Normal)   Pulse 67   Ht 5\' 2"  (1.575 m)   Wt 154 lb 3.2 oz (69.9 kg)   SpO2 99%   BMI 28.20 kg/m  Wt Readings from Last 3 Encounters:  08/26/23 154 lb 3.2 oz (69.9 kg)  05/26/23 154 lb 9.6 oz (70.1 kg)  02/16/23 152 lb (68.9 kg)    Physical Exam Vitals and nursing note reviewed.  Constitutional:      Appearance: She is well-developed.  HENT:     Head: Normocephalic and atraumatic.  Cardiovascular:     Rate and Rhythm: Normal rate and regular rhythm.     Heart sounds: Normal heart sounds. No murmur heard.    No friction rub. No gallop.  Pulmonary:     Effort: Pulmonary effort is normal. No tachypnea or respiratory distress.     Breath sounds: Normal breath sounds. No decreased breath sounds, wheezing, rhonchi or rales.  Chest:     Chest wall: No tenderness.  Abdominal:     General: Bowel sounds are normal.     Palpations: Abdomen is soft.  Musculoskeletal:     Right  shoulder: Normal.     Left shoulder: Decreased range of motion.     Left upper arm: Tenderness present.     Left elbow: Tenderness present in medial epicondyle.     Cervical back: Normal range of motion.  Skin:    General: Skin is warm and dry.  Neurological:     Mental Status: She is alert and oriented to person, place, and time.     Coordination: Coordination normal.  Psychiatric:        Behavior: Behavior normal. Behavior is cooperative.  Thought Content: Thought content normal.        Judgment: Judgment normal.          Patient has been counseled extensively about nutrition and exercise as well as the importance of adherence with medications and regular follow-up. The patient was given clear instructions to go to ER or return to medical center if symptoms don't improve, worsen or new problems develop. The patient verbalized understanding.   Follow-up: Return in about 3 months (around 11/26/2023).   Claiborne Rigg, FNP-BC [facility] and [facility] [place], [place] [phone]   08/26/2023, 11:03 AM

## 2023-08-27 ENCOUNTER — Other Ambulatory Visit: Payer: Self-pay

## 2023-08-27 ENCOUNTER — Ambulatory Visit (INDEPENDENT_AMBULATORY_CARE_PROVIDER_SITE_OTHER): Payer: 59 | Admitting: Licensed Clinical Social Worker

## 2023-08-27 DIAGNOSIS — F4323 Adjustment disorder with mixed anxiety and depressed mood: Secondary | ICD-10-CM

## 2023-08-27 DIAGNOSIS — F4321 Adjustment disorder with depressed mood: Secondary | ICD-10-CM

## 2023-08-27 NOTE — Progress Notes (Signed)
THERAPIST PROGRESS NOTE   Session Date: 08/27/2023  Session Time: 1508 - 1610  Participation Level: Active  Behavioral Response: Casual and Well GroomedAlertDepressed  Type of Therapy: Individual Therapy  Treatment Goals addressed:  - Klani will score less than 5 on the Generalized Anxiety Disorder 7 Scale (GAD-7)  - Report a decrease in anxiety symptoms as evidenced by an overall reduction in anxiety score by a minimum of 25% on the Generalized Anxiety Disorder Scale (GAD-7)  - Reduce frequency, intensity, and duration of depression symptoms so that daily functioning is improved  - Increase coping skills to manage depression and improve ability to perform daily activities  - Namrata will identify cognitive patterns and beliefs that support depression   ProgressTowards Goals: Progressing  Interventions: CBT and Supportive  Summary: Patient is a 62 year old female who presents with stressors surrounding unresolved grief.  Patient reported having spent time with her best friend over the past 2 weeks increasing her time outside and engaging in activities she enjoys.  Patient further detailed continued stress surrounding home environment and challenges surrounding residing with her great niece and nieces friend.  Patient actively engaged in exploring experienced emotions and responses to various triggers, sharing specifics surrounding the frequency and obsessive thoughts she has specific to the loss of her mother and sister.  Patient actively engaged in session, proving receptive to clinician's feedback.  Patient proved receptive to clinicians encouragement to begin journaling and continuing to further explore activities she enjoys and intentionally engage in such events. Patient responded well to interventions. Patient continues to meet criteria for Adjustment Disorder w/ mixed anxiety and depressed moods. Patient will continue to benefit from engagement in outpatient therapy due to being the  least restrictive service to meet presenting needs.      08/27/2023    3:06 PM 08/13/2023    8:20 AM 05/26/2023    9:37 AM 02/16/2023   11:16 AM  GAD 7 : Generalized Anxiety Score  Nervous, Anxious, on Edge 1 1 0 0  Control/stop worrying 1 3 0 0  Worry too much - different things 1 3 1  0  Trouble relaxing 0 0 1 0  Restless 0 0 0 0  Easily annoyed or irritable 1 3 1 1   Afraid - awful might happen 0 0 0 0  Total GAD 7 Score 4 10 3 1   Anxiety Difficulty Not difficult at all Somewhat difficult Not difficult at all        08/27/2023    3:14 PM 08/26/2023   10:35 AM 08/13/2023    8:23 AM 07/27/2023    1:02 PM 05/26/2023    9:37 AM  Depression screen PHQ 2/9  Decreased Interest 0 0 0 1 1  Down, Depressed, Hopeless 1 0 1 1 1   PHQ - 2 Score 1 0 1 2 2   Altered sleeping 0  0 1 0  Tired, decreased energy 1  0 1 1  Change in appetite 1  1 1  0  Feeling bad or failure about yourself  0  0 1 0  Trouble concentrating 0  0 1 0  Moving slowly or fidgety/restless 0  0 1 0  Suicidal thoughts 0  0 1 0  PHQ-9 Score 3  2 9 3   Difficult doing work/chores Not difficult at all  Not difficult at all Not difficult at all Not difficult at all    Patient made minimal progress on identified tx goals at this time.   Suicidal/Homicidal: No  Therapist Response: Clinician  utilized CBT, supportive reflection, and motivational interviewing techniques and efforts to support patient in exploring presenting symptoms and further processing thoughts and feelings surrounding identified stressors.  Clinician actively processed with patient thoughts and feelings specific to the loss of patient's mother and sister and explored patient's understanding of the grieving process and whether the patient believed to have permitted herself to actively grieve such losses.  Clinician assigned patient homework to continue to engage in further explore enjoyable activities and begin actively journaling thoughts and feelings in order  to support the process of navigating factors in relation to presenting stressors and mental health symptoms.  Therapist provided support and empathy to patient during session.  Plan: Return again in 2 weeks.  Diagnosis: Adjustment disorder with mixed anxiety and depressed mood  Unresolved grief  Collaboration of Care: Other None deemed necessary at this time.  Patient/Guardian was advised Release of Information must be obtained prior to any record release in order to collaborate their care with an outside provider. Patient/Guardian was advised if they have not already done so to contact the registration department to sign all necessary forms in order for [place] to release information regarding their care.   Consent: Patient/Guardian gives verbal consent for treatment and assignment of benefits for services provided during this visit. Patient/Guardian expressed understanding and agreed to proceed.   Leisa Lenz, MSW, LCSW 08/27/2023,  4:21 PM

## 2023-09-07 ENCOUNTER — Other Ambulatory Visit: Payer: Self-pay

## 2023-09-09 ENCOUNTER — Ambulatory Visit (INDEPENDENT_AMBULATORY_CARE_PROVIDER_SITE_OTHER): Payer: 59 | Admitting: Licensed Clinical Social Worker

## 2023-09-09 DIAGNOSIS — F4321 Adjustment disorder with depressed mood: Secondary | ICD-10-CM

## 2023-09-09 DIAGNOSIS — F4323 Adjustment disorder with mixed anxiety and depressed mood: Secondary | ICD-10-CM

## 2023-09-09 NOTE — Progress Notes (Signed)
THERAPIST PROGRESS NOTE   Session Date: 09/09/2023  Session Time: 1005-1050  Participation Level: Active  Behavioral Response: Casual and Well GroomedAlertDepressed  Type of Therapy: Individual Therapy  Treatment Goals addressed:  - Mallory Wall will score less than 5 on the Generalized Anxiety Disorder 7 Scale (GAD-7)  - Report a decrease in anxiety symptoms as evidenced by an overall reduction in anxiety score by a minimum of 25% on the Generalized Anxiety Disorder Scale (GAD-7)  - Reduce frequency, intensity, and duration of depression symptoms so that daily functioning is improved  - Increase coping skills to manage depression and improve ability to perform daily activities  - Mallory Wall will identify cognitive patterns and beliefs that support depression   ProgressTowards Goals: Progressing  Interventions: CBT, Motivational Interviewing, and Supportive  Summary: Mallory Wall is a 62 year old female with past psych hx of Adjustment D/O w/ mixed Anxiety and Depressed mood and Unresolved Grief, who presents for continued support in the management of depressive and anxious sxs. Pt provided recounts of past two weeks since previous session, sharing of varied feelings, primarily surrounding home environment. Reviewed re-administered PHQ9 and GAD7 scalings for noted variances, processing presence of reported sxs. Pt detailed experiencing sxs solely in her home, with current family members residing with her proving to be avoidant and unsupportive in the management of the home. Pt expressed having felt content following previous session when able to explore thoughts and feelings surrounding the grief and loss of her sister and mother. Patient actively engaged in session, proving receptive to clinician's feedback. Patient proved receptive to clinicians encouragement to continue participation in activities she enjoys and increase intentionality surrounding happy, positive thoughts and being happy. Patient  responded well to interventions. Patient continues to meet criteria for Adjustment Disorder w/ mixed anxiety and depressed moods. Patient will continue to benefit from engagement in outpatient therapy due to being the least restrictive service to meet presenting needs.    Patient made moderate progress on identified tx goals at this time.     09/09/2023   10:09 AM 08/27/2023    3:14 PM 08/26/2023   10:35 AM 08/13/2023    8:23 AM 07/27/2023    1:02 PM  Depression screen PHQ 2/9  Decreased Interest 0 0 0 0 1  Down, Depressed, Hopeless 1 1 0 1 1  PHQ - 2 Score 1 1 0 1 2  Altered sleeping 0 0  0 1  Tired, decreased energy 1 1  0 1  Change in appetite 0 1  1 1   Feeling bad or failure about yourself  0 0  0 1  Trouble concentrating 0 0  0 1  Moving slowly or fidgety/restless 0 0  0 1  Suicidal thoughts 0 0  0 1  PHQ-9 Score 2 3  2 9   Difficult doing work/chores Somewhat difficult Not difficult at all  Not difficult at all Not difficult at all      09/09/2023   10:07 AM 08/27/2023    3:06 PM 08/13/2023    8:20 AM 05/26/2023    9:37 AM  GAD 7 : Generalized Anxiety Score  Nervous, Anxious, on Edge 1 1 1  0  Control/stop worrying 0 1 3 0  Worry too much - different things 1 1 3 1   Trouble relaxing 0 0 0 1  Restless 0 0 0 0  Easily annoyed or irritable 1 1 3 1   Afraid - awful might happen 0 0 0 0  Total GAD 7 Score 3 4 10  3  Anxiety Difficulty Somewhat difficult Not difficult at all Somewhat difficult Not difficult at all   Suicidal/Homicidal: No  Therapist Response: Clinician utilized CBT, supportive reflection, and motivational interviewing techniques and efforts to support patient in exploring presenting symptoms and further processing thoughts and feelings surrounding identified stressors. Prompted pt to further explore recent events and/or interactions that have created additional stress for her in the home, exploring feelings surrounding interactions and relationships with family  members. Explore pt's understanding of the relationship between thoughts, feelings, and behaviors, and pt's abilities to increase intentionality surrounding thinking positively and feeling happy. Therapist provided support and empathy to patient during session.  Plan: Return again in 2 weeks.  Diagnosis: Adjustment disorder with mixed anxiety and depressed mood  Unresolved grief  Collaboration of Care: Other None deemed necessary at this time.  Patient/Guardian was advised Release of Information must be obtained prior to any record release in order to collaborate their care with an outside provider. Patient/Guardian was advised if they have not already done so to contact the registration department to sign all necessary forms in order for [place] to release information regarding their care.   Consent: Patient/Guardian gives verbal consent for treatment and assignment of benefits for services provided during this visit. Patient/Guardian expressed understanding and agreed to proceed.   Mallory Wall, MSW, LCSW 09/09/2023,  10:02 AM

## 2023-09-23 ENCOUNTER — Ambulatory Visit (HOSPITAL_COMMUNITY): Payer: Commercial Managed Care - HMO | Admitting: Licensed Clinical Social Worker

## 2023-09-23 DIAGNOSIS — F4323 Adjustment disorder with mixed anxiety and depressed mood: Secondary | ICD-10-CM | POA: Diagnosis not present

## 2023-09-23 NOTE — Progress Notes (Signed)
THERAPIST PROGRESS NOTE   Session Date: 09/23/2023  Session Time: 4332-9518  Participation Level: Active  Behavioral Response: Casual and Well GroomedAlertDepressed and Irritable  Type of Therapy: Individual Therapy  Treatment Goals addressed:  - Mallory Wall will score less than 5 on the Generalized Anxiety Disorder 7 Scale (GAD-7)  - Report a decrease in anxiety symptoms as evidenced by an overall reduction in anxiety score by a minimum of 25% on the Generalized Anxiety Disorder Scale (GAD-7)  - Reduce frequency, intensity, and duration of depression symptoms so that daily functioning is improved  - Increase coping skills to manage depression and improve ability to perform daily activities  - Mallory Wall will identify cognitive patterns and beliefs that support depression   ProgressTowards Goals: Progressing  Interventions: CBT, Motivational Interviewing, and Supportive  Summary: Mallory Wall is a 62 year old female with past psych hx of Adjustment D/O w/ mixed Anxiety and Depressed mood and Unresolved Grief, presenting for follow up therapy session for support in the management of depressive and anxious sxs. Pt detailed of things going "okay", further sharing of recent stress surrounding thoughts of approaching holidays and not having any family to spend time with. Explored pt's thoughts and feelings surrounding challenges experienced during holiday season, missing her mother, sister, and distance between family members since passing of mother. Processed pt's thoughts and feelings of frustration and disappointment surrounding relationship with niece that has been residing with pt for 1.5 years, and the absence of interactions between them, making the home environment uncomfortable. Processed pt's perspective and readiness to approach challenging living dynamic with family member. Patient responded well to interventions. Patient continues to meet criteria for Adjustment Disorder w/ mixed anxiety and  depressed moods. Patient will continue to benefit from engagement in outpatient therapy due to being the least restrictive service to meet presenting needs.    Patient made moderate progress on identified tx goals at this time.     09/09/2023   10:09 AM 08/27/2023    3:14 PM 08/26/2023   10:35 AM 08/13/2023    8:23 AM 07/27/2023    1:02 PM  Depression screen PHQ 2/9  Decreased Interest 0 0 0 0 1  Down, Depressed, Hopeless 1 1 0 1 1  PHQ - 2 Score 1 1 0 1 2  Altered sleeping 0 0  0 1  Tired, decreased energy 1 1  0 1  Change in appetite 0 1  1 1   Feeling bad or failure about yourself  0 0  0 1  Trouble concentrating 0 0  0 1  Moving slowly or fidgety/restless 0 0  0 1  Suicidal thoughts 0 0  0 1  PHQ-9 Score 2 3  2 9   Difficult doing work/chores Somewhat difficult Not difficult at all  Not difficult at all Not difficult at all      09/09/2023   10:07 AM 08/27/2023    3:06 PM 08/13/2023    8:20 AM 05/26/2023    9:37 AM  GAD 7 : Generalized Anxiety Score  Nervous, Anxious, on Edge 1 1 1  0  Control/stop worrying 0 1 3 0  Worry too much - different things 1 1 3 1   Trouble relaxing 0 0 0 1  Restless 0 0 0 0  Easily annoyed or irritable 1 1 3 1   Afraid - awful might happen 0 0 0 0  Total GAD 7 Score 3 4 10 3   Anxiety Difficulty Somewhat difficult Not difficult at all Somewhat difficult Not difficult at all  Suicidal/Homicidal: No  Therapist Response: Clinician utilized CBT, supportive reflection, and motivational interviewing techniques in efforts to support patient in exploring depressive and anxious sxs related to presenting stressors. Actively engaged pt in recounts of past two weeks since previous session, evoking pt's perspectives on observations in mood variances in correlation with presenting stressors. Further supported pt in processing various thoughts and feelings surrounding current home environment, stress experienced related to home environment, and plans for  approaching holidays. Actively engaged pt in processing thoughts surrounding action steps and readiness to implement change. Therapist provided support and empathy to patient during session.  Plan: Return again in 2 weeks.  Diagnosis: Adjustment disorder with mixed anxiety and depressed mood  Collaboration of Care: Other None deemed necessary at this time.  Patient/Guardian was advised Release of Information must be obtained prior to any record release in order to collaborate their care with an outside provider. Patient/Guardian was advised if they have not already done so to contact the registration department to sign all necessary forms in order for [place] to release information regarding their care.   Consent: Patient/Guardian gives verbal consent for treatment and assignment of benefits for services provided during this visit. Patient/Guardian expressed understanding and agreed to proceed.   Leisa Lenz, MSW, LCSW 09/23/2023,  11:01 AM

## 2023-10-07 ENCOUNTER — Ambulatory Visit (INDEPENDENT_AMBULATORY_CARE_PROVIDER_SITE_OTHER): Payer: Commercial Managed Care - HMO | Admitting: Licensed Clinical Social Worker

## 2023-10-07 ENCOUNTER — Other Ambulatory Visit: Payer: Self-pay

## 2023-10-07 DIAGNOSIS — F4323 Adjustment disorder with mixed anxiety and depressed mood: Secondary | ICD-10-CM

## 2023-10-07 NOTE — Progress Notes (Signed)
THERAPIST PROGRESS NOTE   Session Date: 10/07/2023  Session Time: 0981-1914  Participation Level: Active  Behavioral Response: Casual and Well GroomedAlertDepressed and Hopeless  Type of Therapy: Individual Therapy  Treatment Goals addressed:  - Mallory Wall will score less than 5 on the Generalized Anxiety Disorder 7 Scale (GAD-7)  - Report a decrease in anxiety symptoms as evidenced by an overall reduction in anxiety score by a minimum of 25% on the Generalized Anxiety Disorder Scale (GAD-7)  - Reduce frequency, intensity, and duration of depression symptoms so that daily functioning is improved  - Increase coping skills to manage depression and improve ability to perform daily activities  - Mallory Wall will identify cognitive patterns and beliefs that support depression   ProgressTowards Goals: Progressing  Interventions: CBT, Motivational Interviewing, and Supportive  Summary: Mallory Wall is a 62 year old female with past psych hx of Adjustment D/O w/ mixed Anxiety and Depressed mood and Unresolved Grief, presenting for follow up therapy session for support in the management of depressive and anxious sxs. Pt provided recounts of recent weeks since previous session, sharing of recent events, detailing challenges experienced over the Thanksgiving holiday, and the continued tension within the household. Further engaged in processing pt's thoughts and feelings surrounding ongoing challenges and primary stressors within the home, exploring pt's perspectives and understandings of boundaries, limits, and the impact on relationships and her current home life. Patient responded well to interventions. Patient continues to meet criteria for Adjustment Disorder w/ mixed anxiety and depressed moods. Patient will continue to benefit from engagement in outpatient therapy due to being the least restrictive service to meet presenting needs.    Patient made moderate progress on identified tx goals at this time.      09/09/2023   10:09 AM 08/27/2023    3:14 PM 08/26/2023   10:35 AM 08/13/2023    8:23 AM 07/27/2023    1:02 PM  Depression screen PHQ 2/9  Decreased Interest 0 0 0 0 1  Down, Depressed, Hopeless 1 1 0 1 1  PHQ - 2 Score 1 1 0 1 2  Altered sleeping 0 0  0 1  Tired, decreased energy 1 1  0 1  Change in appetite 0 1  1 1   Feeling bad or failure about yourself  0 0  0 1  Trouble concentrating 0 0  0 1  Moving slowly or fidgety/restless 0 0  0 1  Suicidal thoughts 0 0  0 1  PHQ-9 Score 2 3  2 9   Difficult doing work/chores Somewhat difficult Not difficult at all  Not difficult at all Not difficult at all      09/09/2023   10:07 AM 08/27/2023    3:06 PM 08/13/2023    8:20 AM 05/26/2023    9:37 AM  GAD 7 : Generalized Anxiety Score  Nervous, Anxious, on Edge 1 1 1  0  Control/stop worrying 0 1 3 0  Worry too much - different things 1 1 3 1   Trouble relaxing 0 0 0 1  Restless 0 0 0 0  Easily annoyed or irritable 1 1 3 1   Afraid - awful might happen 0 0 0 0  Total GAD 7 Score 3 4 10 3   Anxiety Difficulty Somewhat difficult Not difficult at all Somewhat difficult Not difficult at all   Suicidal/Homicidal: No  Therapist Response: Clinician utilized CBT, supportive reflection, and motivational interviewing techniques in efforts to support patient in exploring depressive and anxious sxs related to presenting stressors. Actively engaged pt in  exploration of recounts of recent holiday and challenges surrounding household environment and interactions with family within the home. Further evoked pt's thoughts and feelings surrounding treatment experienced in the home by family members she welcomed into her home, and further elicited pt's thoughts specific to boundaries and limits in which she feels must be set. Evoked pt's thoughts surrounding readiness to address concerns and possible measures she feels she may need to take. Therapist provided support and empathy to patient during session.  Plan:  Return again in 2 weeks.  Diagnosis: Adjustment disorder with mixed anxiety and depressed mood  Collaboration of Care: Other None deemed necessary at this time.  Patient/Guardian was advised Release of Information must be obtained prior to any record release in order to collaborate their care with an outside provider. Patient/Guardian was advised if they have not already done so to contact the registration department to sign all necessary forms in order for [place] to release information regarding their care.   Consent: Patient/Guardian gives verbal consent for treatment and assignment of benefits for services provided during this visit. Patient/Guardian expressed understanding and agreed to proceed.   Leisa Lenz, MSW, LCSW 10/07/2023,  12:01 PM

## 2023-10-10 ENCOUNTER — Other Ambulatory Visit: Payer: Self-pay

## 2023-10-10 ENCOUNTER — Ambulatory Visit (HOSPITAL_COMMUNITY)
Admission: EM | Admit: 2023-10-10 | Discharge: 2023-10-10 | Disposition: A | Discharge status: Home / Self Care | Payer: Commercial Managed Care - HMO | Attending: Emergency Medicine | Admitting: Emergency Medicine

## 2023-10-10 ENCOUNTER — Encounter (HOSPITAL_COMMUNITY): Payer: Self-pay | Admitting: Emergency Medicine

## 2023-10-10 DIAGNOSIS — B9689 Other specified bacterial agents as the cause of diseases classified elsewhere: Secondary | ICD-10-CM | POA: Diagnosis present

## 2023-10-10 DIAGNOSIS — N898 Other specified noninflammatory disorders of vagina: Secondary | ICD-10-CM | POA: Insufficient documentation

## 2023-10-10 DIAGNOSIS — N760 Acute vaginitis: Secondary | ICD-10-CM | POA: Diagnosis present

## 2023-10-10 LAB — POCT URINALYSIS DIP (MANUAL ENTRY)
Bilirubin, UA: NEGATIVE
Blood, UA: NEGATIVE
Glucose, UA: NEGATIVE mg/dL
Ketones, POC UA: NEGATIVE mg/dL
Leukocytes, UA: NEGATIVE
Nitrite, UA: NEGATIVE
Protein Ur, POC: NEGATIVE mg/dL
Spec Grav, UA: 1.01 (ref 1.010–1.025)
Urobilinogen, UA: 0.2 U/dL
pH, UA: 5.5 (ref 5.0–8.0)

## 2023-10-10 MED ORDER — FLUCONAZOLE 150 MG PO TABS: 150.0000 mg | ORAL_TABLET | Freq: Once | ORAL | 0 refills | Status: DC | PRN

## 2023-10-10 MED ORDER — METRONIDAZOLE 500 MG PO TABS: 500.0000 mg | ORAL_TABLET | Freq: Two times a day (BID) | ORAL | 0 refills | Status: AC

## 2023-10-10 NOTE — ED Provider Notes (Signed)
[facility]    CSN: [id number] Arrival date & time: 10/10/23  1011      History   Chief Complaint Chief Complaint  Patient presents with   Vaginal Discharge    HPI Mallory Wall is a 62 y.o. female.  1 week history of thin vaginal discharge Having slight itchiness as well She does have history of BV which had similar symptoms Not concerned about STD exposure Also having some urinary frequency Some suprapubic discomfort and mild nausea.  No vomiting.  Denies flank pain or fever  She is postmenopausal Hx DM  Past Medical History:  Diagnosis Date   Allergy    Back pain    Diabetes mellitus without complication (HCC)    Gallstones    Headache(784.0)    Hyperlipidemia    Vaginal Pap smear, abnormal     Patient Active Problem List   Diagnosis Date Noted   Unresolved grief 08/13/2023   Adjustment disorder with mixed anxiety and depressed mood 08/13/2023   Well woman exam with routine gynecological exam 11/29/2019   Epistaxis, recurrent 07/14/2018   Rhinitis, chronic 07/14/2018   Diabetes mellitus without complication (HCC) 07/06/2018   Dysplasia of cervix, low grade (CIN 1) 03/25/2016   Essential hypertension 01/30/2014   DDD (degenerative disc disease), lumbar 03/18/2013   History of pyelonephritis 03/18/2013   Dyslipidemia 03/18/2013    Past Surgical History:  Procedure Laterality Date   CHOLECYSTECTOMY N/A 01/13/2018   Procedure: LAPAROSCOPIC CHOLECYSTECTOMY;  Surgeon: Abigail Miyamoto, MD;  Location: [facility];  Service: General;  Laterality: N/A;   NO PAST SURGERIES      OB History     Gravida  1   Para  0   Term  0   Preterm  0   AB  1   Living  0      SAB  0   IAB  1   Ectopic  0   Multiple  0   Live Births  0            Home Medications    Prior to Admission medications   Medication Sig Start Date End Date Taking? Authorizing Provider  fluconazole (DIFLUCAN) 150 MG tablet Take 1 tablet  (150 mg total) by mouth once as needed for up to 2 doses (take one pill on day 1, and the second pill 3 days later). 10/10/23  Yes Zanyiah Posten, Lurena Joiner, PA-C  metroNIDAZOLE (FLAGYL) 500 MG tablet Take 1 tablet (500 mg total) by mouth 2 (two) times daily for 7 days. 10/10/23 10/17/23 Yes Treon Kehl, Lurena Joiner, PA-C  Blood Glucose Monitoring Suppl (ONETOUCH VERIO) w/Device KIT Use to check blood sugar once daily. 11/25/22   Hoy Register, MD  diclofenac Sodium (VOLTAREN) 1 % GEL Apply 2 g topically 4 (four) times daily. 05/26/23   Claiborne Rigg, NP  famotidine (PEPCID) 20 MG tablet Take 1 tablet (20 mg total) by mouth daily. 02/16/23   Claiborne Rigg, NP  glucose blood (ONETOUCH VERIO) test strip Use to check blood sugar once daily. 11/25/22   Hoy Register, MD  latanoprost (XALATAN) 0.005 % ophthalmic solution Place 1 drop into both eyes at bedtime. 02/16/23   Claiborne Rigg, NP  lisinopril (ZESTRIL) 20 MG tablet Take 1 tablet (20 mg total) by mouth daily. 02/16/23   Claiborne Rigg, NP  meloxicam (MOBIC) 7.5 MG tablet Take 1 tablet (7.5 mg total) by mouth daily. 02/16/23   Claiborne Rigg, NP  metFORMIN (GLUCOPHAGE) 500 MG tablet  Take 1 tablet (500 mg total) by mouth daily with breakfast. 02/16/23   Claiborne Rigg, NP  methocarbamol (ROBAXIN) 500 MG tablet Take 1 tablet (500 mg total) by mouth 4 (four) times daily. 02/16/23   Claiborne Rigg, NP  OneTouch Delica Lancets 33G MISC Use to check blood sugar once daily. 11/25/22   Hoy Register, MD  rosuvastatin (CRESTOR) 5 MG tablet Take 1 tablet (5 mg total) by mouth daily. 02/16/23   Claiborne Rigg, NP  topiramate (TOPAMAX) 25 MG tablet Take 1-2 tablets (25-50 mg total) by mouth 2 (two) times daily. 02/16/23   Claiborne Rigg, NP    Family History Family History  Problem Relation Age of Onset   CAD Mother    Hypertension Mother    Diabetes type II Sister    Lung disease Sister    Colon cancer Neg Hx    Esophageal cancer Neg Hx    Pancreatic  cancer Neg Hx    Rectal cancer Neg Hx    Stomach cancer Neg Hx    Breast cancer Neg Hx     Social History Social History   Tobacco Use   Smoking status: Every Day    Current packs/day: 1.00    Average packs/day: 1 pack/day for 49.0 years (49.0 ttl pk-yrs)    Types: Cigarettes   Smokeless tobacco: Never  Vaping Use   Vaping status: Never Used  Substance Use Topics   Alcohol use: Not Currently   Drug use: No     Allergies   Patient has no known allergies.   Review of Systems Review of Systems  Genitourinary:  Positive for vaginal discharge.   As per HPI  Physical Exam Triage Vital Signs ED Triage Vitals  Encounter Vitals Group     BP 10/10/23 1101 (!) 160/96     Systolic BP Percentile --      Diastolic BP Percentile --      Pulse Rate 10/10/23 1101 85     Resp 10/10/23 1101 18     Temp 10/10/23 1101 98 F (36.7 C)     Temp Source 10/10/23 1101 Oral     SpO2 10/10/23 1101 99 %     Weight --      Height --      Head Circumference --      Peak Flow --      Pain Score 10/10/23 1102 0     Pain Loc --      Pain Education --      Exclude from Growth Chart --    No data found.  Updated Vital Signs BP (!) 160/96 (BP Location: Right Arm)   Pulse 85   Temp 98 F (36.7 C) (Oral)   Resp 18   SpO2 99%    Physical Exam Vitals and nursing note reviewed.  Constitutional:      General: She is not in acute distress. HENT:     Mouth/Throat:     Mouth: Mucous membranes are moist.     Pharynx: Oropharynx is clear.  Eyes:     Conjunctiva/sclera: Conjunctivae normal.  Cardiovascular:     Rate and Rhythm: Normal rate and regular rhythm.     Heart sounds: Normal heart sounds.  Pulmonary:     Effort: Pulmonary effort is normal.     Breath sounds: Normal breath sounds.  Abdominal:     Palpations: Abdomen is soft.     Tenderness: There is no abdominal tenderness. There is no right  CVA tenderness, left CVA tenderness or guarding.  Neurological:     Mental Status:  She is alert and oriented to person, place, and time.     UC Treatments / Results  Labs (all labs ordered are listed, but only abnormal results are displayed) Labs Reviewed  POCT URINALYSIS DIP (MANUAL ENTRY)  CERVICOVAGINAL ANCILLARY ONLY    EKG  Radiology No results found.  Procedures Procedures   Medications Ordered in UC Medications - No data to display  Initial Impression / Assessment and Plan / UC Course  I have reviewed the triage vital signs and the nursing notes.  Pertinent labs & imaging results that were available during my care of the patient were reviewed by me and considered in my medical decision making (see chart for details).  UA negative  Cytology swab pending for BV and yeast Treat with Flagyl twice daily for 7 days, fluconazole 2 dose Return precautions discussed. Patient agrees to plan, no questions   Final Clinical Impressions(s) / UC Diagnoses   Final diagnoses:  Vaginal discharge  Bacterial vaginosis     Discharge Instructions      We are testing you for BV and yeast. I am treating you for both with flagyl and fluconazole. Please take medications as prescribed     ED Prescriptions     Medication Sig Dispense Auth. Provider   metroNIDAZOLE (FLAGYL) 500 MG tablet Take 1 tablet (500 mg total) by mouth 2 (two) times daily for 7 days. 14 tablet Khy Pitre, PA-C   fluconazole (DIFLUCAN) 150 MG tablet Take 1 tablet (150 mg total) by mouth once as needed for up to 2 doses (take one pill on day 1, and the second pill 3 days later). 2 tablet Toree Edling, Lurena Joiner, PA-C      PDMP not reviewed this encounter.   Marlow Baars, [place] 10/10/23 1148

## 2023-10-10 NOTE — Discharge Instructions (Signed)
We are testing you for BV and yeast. I am treating you for both with flagyl and fluconazole. Please take medications as prescribed

## 2023-10-10 NOTE — ED Triage Notes (Signed)
Pt having vaginal discharge since last Friday and itchiness with not relief.

## 2023-10-12 LAB — CERVICOVAGINAL ANCILLARY ONLY
Bacterial Vaginitis (gardnerella): NEGATIVE
Candida Glabrata: NEGATIVE
Candida Vaginitis: NEGATIVE
Comment: NEGATIVE
Comment: NEGATIVE
Comment: NEGATIVE

## 2023-10-15 ENCOUNTER — Other Ambulatory Visit: Payer: Self-pay

## 2023-10-19 ENCOUNTER — Ambulatory Visit (INDEPENDENT_AMBULATORY_CARE_PROVIDER_SITE_OTHER): Payer: Commercial Managed Care - HMO | Admitting: Licensed Clinical Social Worker

## 2023-10-19 DIAGNOSIS — F4323 Adjustment disorder with mixed anxiety and depressed mood: Secondary | ICD-10-CM

## 2023-10-19 NOTE — Progress Notes (Signed)
THERAPIST PROGRESS NOTE   Session Date: 10/19/2023  Session Time: 9528-4132  Participation Level: Active  Behavioral Response: Casual and Well GroomedAlertDepressed and Hopeless  Type of Therapy: Individual Therapy  Treatment Goals addressed:  - Paisly will score less than 5 on the Generalized Anxiety Disorder 7 Scale (GAD-7)  - Report a decrease in anxiety symptoms as evidenced by an overall reduction in anxiety score by a minimum of 25% on the Generalized Anxiety Disorder Scale (GAD-7)  - Reduce frequency, intensity, and duration of depression symptoms so that daily functioning is improved  - Increase coping skills to manage depression and improve ability to perform daily activities  - Graciella Belton will identify cognitive patterns and beliefs that support depression   ProgressTowards Goals: Progressing  Interventions: CBT, Motivational Interviewing, and Supportive  Summary: Graciella Belton is a 62 year old female with past psych hx of Adjustment D/O w/ mixed Anxiety and Depressed mood and Unresolved Grief, presenting for follow up therapy session for support in the management of depressive and anxious sxs. Pt detailed events of the past two weeks since previous session, sharing of continued reduction in depressive and anxious sxs, further sharing of things generally going well outside of the challenges faced inside the home with niece and current living arrangements. Pt engaged in re-administering of PHQ9 and GAD7, exploring continued areas of reduction in sxs and pt's improved efforts at managing stressors. Further explored pt's readiness to approach concerns with family, processing various factors that result in reluctance. Processed upcoming holiday plans and the impact current home stressors have on pt's moods. Patient responded well to interventions. Patient continues to meet criteria for Adjustment Disorder w/ mixed anxiety and depressed moods. Patient will continue to benefit from engagement in  outpatient therapy due to being the least restrictive service to meet presenting needs.    Patient made moderate progress on identified tx goals at this time.     10/19/2023   11:09 AM 09/09/2023   10:09 AM 08/27/2023    3:14 PM 08/26/2023   10:35 AM 08/13/2023    8:23 AM  Depression screen PHQ 2/9  Decreased Interest 1 0 0 0 0  Down, Depressed, Hopeless 0 1 1 0 1  PHQ - 2 Score 1 1 1  0 1  Altered sleeping 0 0 0  0  Tired, decreased energy 1 1 1   0  Change in appetite 0 0 1  1  Feeling bad or failure about yourself  0 0 0  0  Trouble concentrating 0 0 0  0  Moving slowly or fidgety/restless 0 0 0  0  Suicidal thoughts 0 0 0  0  PHQ-9 Score 2 2 3  2   Difficult doing work/chores Not difficult at all Somewhat difficult Not difficult at all  Not difficult at all      10/19/2023   11:06 AM 09/09/2023   10:07 AM 08/27/2023    3:06 PM 08/13/2023    8:20 AM  GAD 7 : Generalized Anxiety Score  Nervous, Anxious, on Edge 0 1 1 1   Control/stop worrying 0 0 1 3  Worry too much - different things 0 1 1 3   Trouble relaxing 0 0 0 0  Restless 0 0 0 0  Easily annoyed or irritable 1 1 1 3   Afraid - awful might happen 0 0 0 0  Total GAD 7 Score 1 3 4 10   Anxiety Difficulty Not difficult at all Somewhat difficult Not difficult at all Somewhat difficult   Suicidal/Homicidal: No  Therapist  Response: Clinician utilized CBT, supportive reflection, and motivational interviewing techniques in efforts to support patient in exploring presenting stressors. Actively engaged pt in processing observed improvements in reduction of sxs across settings, and minimal stressors outside of household stress related to living arrangements. Further elicited pt's feelings surrounding recent reflection on processing presenting stress, evoking pt's perspectives on potential action steps and outlook. Evoked pt's thoughts surrounding reluctance towards addressing matter, and pt's reported interactions with other family  members surrounding presenting stressor. Therapist provided support and empathy to patient during session.  Plan: Return again in 2 weeks.  Diagnosis: Adjustment disorder with mixed anxiety and depressed mood  Collaboration of Care: Other None deemed necessary at this time.  Patient/Guardian was advised Release of Information must be obtained prior to any record release in order to collaborate their care with an outside provider. Patient/Guardian was advised if they have not already done so to contact the registration department to sign all necessary forms in order for [place] to release information regarding their care.   Consent: Patient/Guardian gives verbal consent for treatment and assignment of benefits for services provided during this visit. Patient/Guardian expressed understanding and agreed to proceed.   Leisa Lenz, MSW, LCSW 10/19/2023,  11:53 AM

## 2023-11-04 ENCOUNTER — Ambulatory Visit (INDEPENDENT_AMBULATORY_CARE_PROVIDER_SITE_OTHER): Payer: Commercial Managed Care - HMO | Admitting: Licensed Clinical Social Worker

## 2023-11-04 DIAGNOSIS — F4323 Adjustment disorder with mixed anxiety and depressed mood: Secondary | ICD-10-CM

## 2023-11-04 NOTE — Progress Notes (Signed)
 THERAPIST PROGRESS NOTE   Session Date: 11/04/2023  Session Time: 8871-8787  Participation Level: Active  Behavioral Response: Casual and Well GroomedAlertDepressed and Irritable  Type of Therapy: Individual Therapy  Treatment Goals addressed:  - Mallory Wall will score less than 5 on the Generalized Anxiety Disorder 7 Scale (GAD-7)  - Report a decrease in anxiety symptoms as evidenced by an overall reduction in anxiety score by a minimum of 25% on the Generalized Anxiety Disorder Scale (GAD-7)  - Reduce frequency, intensity, and duration of depression symptoms so that daily functioning is improved  - Increase coping skills to manage depression and improve ability to perform daily activities  - Mallory Wall will identify cognitive patterns and beliefs that support depression   ProgressTowards Goals: Progressing  Interventions: CBT, Motivational Interviewing, and Supportive  Summary: Mallory Wall is a 63 year old female with past psych hx of Adjustment D/O w/ mixed Anxiety and Depressed mood and Unresolved Grief, presenting for follow up therapy session for support in the management of depressive and anxious sxs. Pt actively engaged in session, presenting initially in depressed and irritable moods, improving throughout session. Pt actively detailed events of the past two weeks since previous session, sharing of workplace challenges resulting due to actions of a colleague and current uncertainty surrounding future of job creating significantly increased stress and anxiety. Actively explored thoughts and feelings surrounding events at workplace, identifying pt's feelings related to her character and integrity being harmed. Revisited previous identified stressors related to home environment and challenges with family currently residing in her home, processing pt's feelings and perspectives surrounding stressors and potential means in which pt can navigate challenges.  Patient responded well to interventions.  Patient continues to meet criteria for Adjustment Disorder w/ mixed anxiety and depressed moods. Patient will continue to benefit from engagement in outpatient therapy due to being the least restrictive service to meet presenting needs.       11/04/2023   11:30 AM 10/19/2023   11:09 AM 09/09/2023   10:09 AM 08/27/2023    3:14 PM 08/26/2023   10:35 AM  Depression screen PHQ 2/9  Decreased Interest 0 1 0 0 0  Down, Depressed, Hopeless 1 0 1 1 0  PHQ - 2 Score 1 1 1 1  0  Altered sleeping 1 0 0 0   Tired, decreased energy 0 1 1 1    Change in appetite 0 0 0 1   Feeling bad or failure about yourself  0 0 0 0   Trouble concentrating 0 0 0 0   Moving slowly or fidgety/restless 0 0 0 0   Suicidal thoughts 0 0 0 0   PHQ-9 Score 2 2 2 3    Difficult doing work/chores Somewhat difficult Not difficult at all Somewhat difficult Not difficult at all       11/04/2023   11:28 AM 10/19/2023   11:06 AM 09/09/2023   10:07 AM 08/27/2023    3:06 PM  GAD 7 : Generalized Anxiety Score  Nervous, Anxious, on Edge 0 0 1 1  Control/stop worrying 0 0 0 1  Worry too much - different things 1 0 1 1  Trouble relaxing 1 0 0 0  Restless 0 0 0 0  Easily annoyed or irritable 1 1 1 1   Afraid - awful might happen 0 0 0 0  Total GAD 7 Score 3 1 3 4   Anxiety Difficulty Very difficult Not difficult at all Somewhat difficult Not difficult at all   Suicidal/Homicidal: No  Therapist Response: Clinician utilized CBT,  supportive reflection, and motivational interviewing techniques in efforts to support patient in exploring presenting stressors. Re-assessed depression and anxiety severity and frequency scalings, engaging pt in reflection of variances in scalings and evoking pt's thoughts and perspectives related to contributing factors. Actively listened as pt provided recounts of past two weeks, eliciting pt's thoughts and feelings related to anxiety provoking situations and challenges. Evoked pt's thoughts and perspectives  surrounding how she believes she may navigate presenting stressors. Encouraged pt to resume journaling/writing, in aims of reducing over thinking and support in relaxation and improve restful sleep. Therapist provided support and empathy to patient during session.  Plan: Return again in 2 weeks.  Diagnosis: Adjustment disorder with mixed anxiety and depressed mood  Collaboration of Care: Other None deemed necessary at this time.  Patient/Guardian was advised Release of Information must be obtained prior to any record release in order to collaborate their care with an outside provider. Patient/Guardian was advised if they have not already done so to contact the registration department to sign all necessary forms in order for us  to release information regarding their care.   Consent: Patient/Guardian gives verbal consent for treatment and assignment of benefits for services provided during this visit. Patient/Guardian expressed understanding and agreed to proceed.   Lynwood JONETTA Maris, MSW, LCSW 11/04/2023,  12:20 PM

## 2023-11-09 ENCOUNTER — Other Ambulatory Visit: Payer: Self-pay

## 2023-11-10 ENCOUNTER — Other Ambulatory Visit: Payer: Self-pay

## 2023-11-18 ENCOUNTER — Ambulatory Visit (HOSPITAL_COMMUNITY): Payer: Commercial Managed Care - HMO | Admitting: Licensed Clinical Social Worker

## 2023-11-27 ENCOUNTER — Other Ambulatory Visit: Payer: Self-pay

## 2023-11-27 ENCOUNTER — Encounter: Payer: Self-pay | Admitting: Nurse Practitioner

## 2023-11-27 ENCOUNTER — Ambulatory Visit: Payer: Commercial Managed Care - HMO | Attending: Nurse Practitioner | Admitting: Nurse Practitioner

## 2023-11-27 VITALS — BP 122/84 | HR 85 | Resp 20 | Ht 62.0 in | Wt 156.8 lb

## 2023-11-27 DIAGNOSIS — H401131 Primary open-angle glaucoma, bilateral, mild stage: Secondary | ICD-10-CM | POA: Diagnosis not present

## 2023-11-27 DIAGNOSIS — I10 Essential (primary) hypertension: Secondary | ICD-10-CM | POA: Diagnosis not present

## 2023-11-27 DIAGNOSIS — E7800 Pure hypercholesterolemia, unspecified: Secondary | ICD-10-CM

## 2023-11-27 DIAGNOSIS — E119 Type 2 diabetes mellitus without complications: Secondary | ICD-10-CM

## 2023-11-27 DIAGNOSIS — Z7984 Long term (current) use of oral hypoglycemic drugs: Secondary | ICD-10-CM

## 2023-11-27 LAB — POCT GLYCOSYLATED HEMOGLOBIN (HGB A1C): Hemoglobin A1C: 7.3 % — AB (ref 4.0–5.6)

## 2023-11-27 MED ORDER — ROSUVASTATIN CALCIUM 5 MG PO TABS
5.0000 mg | ORAL_TABLET | Freq: Every day | ORAL | 2 refills | Status: DC
  Filled 2023-11-27: qty 90, 90d supply, fill #0
  Filled 2023-12-13: qty 30, 30d supply, fill #0
  Filled 2024-02-29: qty 30, 30d supply, fill #1
  Filled 2024-03-30: qty 30, 30d supply, fill #2
  Filled 2024-05-01: qty 30, 30d supply, fill #3
  Filled 2024-06-05: qty 30, 30d supply, fill #4
  Filled 2024-07-04: qty 30, 30d supply, fill #5
  Filled 2024-08-02: qty 30, 30d supply, fill #6

## 2023-11-27 MED ORDER — LATANOPROST 0.005 % OP SOLN
1.0000 [drp] | Freq: Every evening | OPHTHALMIC | 3 refills | Status: AC
  Filled 2023-11-27: qty 5, 100d supply, fill #0
  Filled 2023-12-13: qty 2.5, 25d supply, fill #0
  Filled 2024-01-18: qty 2.5, 25d supply, fill #1
  Filled 2024-02-29: qty 2.5, 25d supply, fill #2
  Filled 2024-03-30: qty 2.5, 25d supply, fill #3
  Filled 2024-05-01: qty 2.5, 25d supply, fill #4
  Filled 2024-08-02: qty 2.5, 25d supply, fill #5

## 2023-11-27 MED ORDER — LISINOPRIL 20 MG PO TABS
20.0000 mg | ORAL_TABLET | Freq: Every day | ORAL | 3 refills | Status: DC
  Filled 2023-11-27 – 2023-12-29 (×2): qty 90, 90d supply, fill #0
  Filled 2024-01-02: qty 30, 30d supply, fill #0
  Filled 2024-01-29: qty 30, 30d supply, fill #1
  Filled 2024-02-29: qty 30, 30d supply, fill #2
  Filled 2024-03-30: qty 30, 30d supply, fill #3
  Filled 2024-05-01: qty 30, 30d supply, fill #4
  Filled 2024-06-05: qty 30, 30d supply, fill #5
  Filled 2024-07-04: qty 30, 30d supply, fill #6
  Filled 2024-08-02: qty 30, 30d supply, fill #7
  Filled 2024-09-04: qty 30, 30d supply, fill #8
  Filled 2024-10-05: qty 30, 30d supply, fill #9
  Filled 2024-10-31: qty 30, 30d supply, fill #10

## 2023-11-27 MED ORDER — METFORMIN HCL 500 MG PO TABS
500.0000 mg | ORAL_TABLET | Freq: Every day | ORAL | 1 refills | Status: DC
  Filled 2023-11-27 – 2024-01-02 (×2): qty 30, 30d supply, fill #0

## 2023-11-27 NOTE — Progress Notes (Signed)
Assessment & Plan:  Mallory Wall was seen today for medical management of chronic issues.  Diagnoses and all orders for this visit:  Diabetes mellitus treated with oral medication  Follow up in 3 months Avoid high carb foods. Exercise 3-4 times per week.  -     POCT glycosylated hemoglobin (Hb A1C) -     CMP14+EGFR -     metFORMIN (GLUCOPHAGE) 500 MG tablet; Take 1 tablet (500 mg total) by mouth daily with breakfast. Continue blood sugar control as discussed in office today, low carbohydrate diet, and regular physical exercise as tolerated, 150 minutes per week (30 min each day, 5 days per week, or 50 min 3 days per week). Keep blood sugar logs with fasting goal of 90-130 mg/dl, post prandial (after you eat) less than 180.  For Hypoglycemia: BS <60 and Hyperglycemia BS >400; contact the clinic ASAP. Annual eye exams and foot exams are recommended.   Primary hypertension -     lisinopril (ZESTRIL) 20 MG tablet; Take 1 tablet (20 mg total) by mouth daily. Continue all antihypertensives as prescribed.  Reminded to bring in blood pressure log for follow  up appointment.  RECOMMENDATIONS: DASH/Mediterranean Diets are healthier choices for HTN.    Hypercholesterolemia -     Lipid panel -     rosuvastatin (CRESTOR) 5 MG tablet; Take 1 tablet (5 mg total) by mouth daily. INSTRUCTIONS: Work on a low fat, heart healthy diet and participate in regular aerobic exercise program by working out at least 150 minutes per week; 5 days a week-30 minutes per day. Avoid red meat/beef/steak,  fried foods. junk foods, sodas, sugary drinks, unhealthy snacking, alcohol and smoking.  Drink at least 80 oz of water per day and monitor your carbohydrate intake daily.    Primary open angle glaucoma (POAG) of both eyes, mild stage -     latanoprost (XALATAN) 0.005 % ophthalmic solution; Place 1 drop into both eyes at bedtime.       Patient has been counseled on age-appropriate routine health concerns for screening  and prevention. These are reviewed and up-to-date. Referrals have been placed accordingly. Immunizations are up-to-date or declined.    Subjective:   Chief Complaint  Patient presents with   Medical Management of Chronic Issues    Mallory Wall 64 y.o. female presents to office today for follow up to DM and HTN  She has a past medical history of Allergy, Back pain, DM2, Gallstones, Hyperlipidemia, and abnormal pap smear   Patient has been counseled on age-appropriate routine health concerns for screening and prevention. These are reviewed and up-to-date. Referrals have been placed accordingly. Immunizations are up-to-date or declined.     Mammogram: Up-to-date Colon cancer screening: Up-to-date Pap smear: Up-to-date   She does not like taking topirimate because "I heard it can take your hair out"   DM 2 A1c increased over the past several months. Weight is also up a few lbs. She is currently taking metformin 500 mg Daily. Notes decreased energy and fatigue over the past week. Also not sleeping well.  Lab Results  Component Value Date   HGBA1C 7.3 (A) 11/27/2023    Lab Results  Component Value Date   HGBA1C 6.9 (A) 05/26/2023  LDL not at goal with rosuvastatin 5 mg daily.   Lab Results  Component Value Date   LDLCALC 88 08/15/2022    HTN Blood pressure is well controlled. She is taking lisinopril 20 mg daily.  BP Readings from Last 3 Encounters:  11/27/23 122/84  10/10/23 (!) 160/96  08/26/23 (!) 142/86    Review of Systems  Constitutional:  Positive for malaise/fatigue. Negative for fever and weight loss.  HENT: Negative.  Negative for nosebleeds.   Eyes: Negative.  Negative for blurred vision, double vision and photophobia.  Respiratory: Negative.  Negative for cough and shortness of breath.   Cardiovascular: Negative.  Negative for chest pain, palpitations and leg swelling.  Gastrointestinal: Negative.  Negative for heartburn, nausea and vomiting.   Musculoskeletal: Negative.  Negative for myalgias.  Neurological:  Positive for headaches. Negative for dizziness, focal weakness and seizures.  Psychiatric/Behavioral:  Negative for suicidal ideas. The patient has insomnia.     Past Medical History:  Diagnosis Date   Allergy    Back pain    Diabetes mellitus without complication (HCC)    Gallstones    Headache(784.0)    Hyperlipidemia    Vaginal Pap smear, abnormal     Past Surgical History:  Procedure Laterality Date   CHOLECYSTECTOMY N/A 01/13/2018   Procedure: LAPAROSCOPIC CHOLECYSTECTOMY;  Surgeon: Abigail Miyamoto, MD;  Location: [facility];  Service: General;  Laterality: N/A;   NO PAST SURGERIES      Family History  Problem Relation Age of Onset   CAD Mother    Hypertension Mother    Diabetes type II Sister    Lung disease Sister    Colon cancer Neg Hx    Esophageal cancer Neg Hx    Pancreatic cancer Neg Hx    Rectal cancer Neg Hx    Stomach cancer Neg Hx    Breast cancer Neg Hx     Social History Reviewed with no changes to be made today.   Outpatient Medications Prior to Visit  Medication Sig Dispense Refill   Blood Glucose Monitoring Suppl (ONETOUCH VERIO) w/Device KIT Use to check blood sugar once daily. 1 kit 0   diclofenac Sodium (VOLTAREN) 1 % GEL Apply 2 g topically 4 (four) times daily. 200 g 1   famotidine (PEPCID) 20 MG tablet Take 1 tablet (20 mg total) by mouth daily. 90 tablet 2   glucose blood (ONETOUCH VERIO) test strip Use to check blood sugar once daily. 100 each 2   meloxicam (MOBIC) 7.5 MG tablet Take 1 tablet (7.5 mg total) by mouth daily. 90 tablet 1   methocarbamol (ROBAXIN) 500 MG tablet Take 1 tablet (500 mg total) by mouth 4 (four) times daily. 90 tablet 6   OneTouch Delica Lancets 33G MISC Use to check blood sugar once daily. 100 each 2   topiramate (TOPAMAX) 25 MG tablet Take 1-2 tablets (25-50 mg total) by mouth 2 (two) times daily. 90 tablet 1   fluconazole  (DIFLUCAN) 150 MG tablet Take 1 tablet (150 mg total) by mouth once as needed for up to 2 doses (take one pill on day 1, and the second pill 3 days later). 2 tablet 0   latanoprost (XALATAN) 0.005 % ophthalmic solution Place 1 drop into both eyes at bedtime. 5 mL 3   lisinopril (ZESTRIL) 20 MG tablet Take 1 tablet (20 mg total) by mouth daily. 90 tablet 3   metFORMIN (GLUCOPHAGE) 500 MG tablet Take 1 tablet (500 mg total) by mouth daily with breakfast. 90 tablet 1   rosuvastatin (CRESTOR) 5 MG tablet Take 1 tablet (5 mg total) by mouth daily. 90 tablet 2   No facility-administered medications prior to visit.    No Known Allergies     Objective:  BP 122/84 (BP Location: Left Arm, Patient Position: Sitting, Cuff Size: Normal)   Pulse 85   Resp 20   Ht 5\' 2"  (1.575 m)   Wt 156 lb 12.8 oz (71.1 kg)   SpO2 98%   BMI 28.68 kg/m  Wt Readings from Last 3 Encounters:  11/27/23 156 lb 12.8 oz (71.1 kg)  08/26/23 154 lb 3.2 oz (69.9 kg)  05/26/23 154 lb 9.6 oz (70.1 kg)    Physical Exam Vitals and nursing note reviewed.  Constitutional:      Appearance: She is well-developed.  HENT:     Head: Normocephalic and atraumatic.  Cardiovascular:     Rate and Rhythm: Normal rate and regular rhythm.     Heart sounds: Normal heart sounds. No murmur heard.    No friction rub. No gallop.  Pulmonary:     Effort: Pulmonary effort is normal. No tachypnea or respiratory distress.     Breath sounds: Normal breath sounds. No decreased breath sounds, wheezing, rhonchi or rales.  Chest:     Chest wall: No tenderness.  Abdominal:     General: Bowel sounds are normal.     Palpations: Abdomen is soft.  Musculoskeletal:        General: Normal range of motion.     Cervical back: Normal range of motion.  Skin:    General: Skin is warm and dry.  Neurological:     Mental Status: She is alert and oriented to person, place, and time.     Coordination: Coordination normal.  Psychiatric:         Behavior: Behavior normal. Behavior is cooperative.        Thought Content: Thought content normal.        Judgment: Judgment normal.          Patient has been counseled extensively about nutrition and exercise as well as the importance of adherence with medications and regular follow-up. The patient was given clear instructions to go to ER or return to medical center if symptoms don't improve, worsen or new problems develop. The patient verbalized understanding.   Follow-up: Return in about 3 months (around 02/24/2024).   Claiborne Rigg, FNP-BC [facility] [place], [place] [phone]   11/27/2023, 12:18 PM

## 2023-11-28 ENCOUNTER — Encounter: Payer: Self-pay | Admitting: Nurse Practitioner

## 2023-11-28 LAB — LIPID PANEL
Chol/HDL Ratio: 3 {ratio} (ref 0.0–4.4)
Cholesterol, Total: 133 mg/dL (ref 100–199)
HDL: 44 mg/dL (ref >39)
LDL Chol Calc (NIH): 78 mg/dL (ref 0–99)
Triglycerides: 49 mg/dL (ref 0–149)
VLDL Cholesterol Cal: 11 mg/dL (ref 5–40)

## 2023-11-28 LAB — CMP14+EGFR
ALT: 17 [IU]/L (ref 0–32)
AST: 15 [IU]/L (ref 0–40)
Albumin: 4.4 g/dL (ref 3.9–4.9)
Alkaline Phosphatase: 99 [IU]/L (ref 44–121)
BUN/Creatinine Ratio: 16 (ref 12–28)
BUN: 9 mg/dL (ref 8–27)
Bilirubin Total: <0.2 mg/dL (ref 0.0–1.2)
CO2: 26 mmol/L (ref 20–29)
Calcium: 9.3 mg/dL (ref 8.7–10.3)
Chloride: 102 mmol/L (ref 96–106)
Creatinine, Ser: 0.55 mg/dL — ABNORMAL LOW (ref 0.57–1.00)
Globulin, Total: 2.8 g/dL (ref 1.5–4.5)
Glucose: 97 mg/dL (ref 70–99)
Potassium: 4.3 mmol/L (ref 3.5–5.2)
Sodium: 142 mmol/L (ref 134–144)
Total Protein: 7.2 g/dL (ref 6.0–8.5)
eGFR: 104 mL/min/{1.73_m2} (ref >59)

## 2023-12-03 ENCOUNTER — Ambulatory Visit (INDEPENDENT_AMBULATORY_CARE_PROVIDER_SITE_OTHER): Payer: Commercial Managed Care - HMO | Admitting: Licensed Clinical Social Worker

## 2023-12-03 DIAGNOSIS — F4323 Adjustment disorder with mixed anxiety and depressed mood: Secondary | ICD-10-CM | POA: Diagnosis not present

## 2023-12-03 NOTE — Progress Notes (Signed)
 THERAPIST PROGRESS NOTE   Session Date: 12/03/2023  Session Time: 8994-8943  Participation Level: Active  Behavioral Response: Casual and Well GroomedAlertDepressed and Euthymic  Type of Therapy: Individual Therapy  Treatment Goals addressed:  - Report a decrease in anxiety symptoms as evidenced by an overall reduction in anxiety score by a minimum of 25% on the Generalized Anxiety Disorder Scale (GAD-7)  - Reduce frequency, intensity, and duration of depression symptoms so that daily functioning is improved  - Increase coping skills to manage depression and improve ability to perform daily activities  - Fe will identify cognitive patterns and beliefs that support depression   ProgressTowards Goals: Progressing  Interventions: CBT, Motivational Interviewing, and Supportive  Summary: Mallory Wall is a 63 year old female with past psych hx of Adjustment D/O w/ mixed Anxiety and Depressed mood, presenting for follow up therapy session for support in the management of depressive and anxious sxs. Pt actively engaged in session, presenting initially in depressed moods, improving throughout session. Actively engaged pt in review of prior PHQ-9 and GAD-7 scores, collaboratively determining no continued need for re-administering due to hx of minimal reported sxs.  - Pt actively detailed recounts of events of past month since previous session, providing recounts of returning to work following period of leave, further sharing thoughts and feeling surrounding transition back to workplace, detailing thoughts of people having talked about reasons as to why she was out, people having different views of her, looking at her differently, and treating her differently. - Engaged pt in processing the validity of expressed negative thoughts, navigating her understanding of cognitive distortions and negative thought patterns, as well as understanding of relationships between ones thoughts, feelings, and behaviors.  Provided Challenging Negative Thoughts activity, utilizing examples of pt's previously expressed thoughts, processing the presence of evidence, alternate/contrary evidence, if she has all information to interpret situation, how a friend may view the situation, and if this will still be an issue after a period of time.   Patient responded well to interventions. Patient continues to meet criteria for Adjustment Disorder w/ mixed anxiety and depressed moods. Patient will continue to benefit from engagement in outpatient therapy due to being the least restrictive service to meet presenting needs.       11/04/2023   11:30 AM 10/19/2023   11:09 AM 09/09/2023   10:09 AM 08/27/2023    3:14 PM 08/26/2023   10:35 AM  Depression screen PHQ 2/9  Decreased Interest 0 1 0 0 0  Down, Depressed, Hopeless 1 0 1 1 0  PHQ - 2 Score 1 1 1 1  0  Altered sleeping 1 0 0 0   Tired, decreased energy 0 1 1 1    Change in appetite 0 0 0 1   Feeling bad or failure about yourself  0 0 0 0   Trouble concentrating 0 0 0 0   Moving slowly or fidgety/restless 0 0 0 0   Suicidal thoughts 0 0 0 0   PHQ-9 Score 2 2 2 3    Difficult doing work/chores Somewhat difficult Not difficult at all Somewhat difficult Not difficult at all       11/04/2023   11:28 AM 10/19/2023   11:06 AM 09/09/2023   10:07 AM 08/27/2023    3:06 PM  GAD 7 : Generalized Anxiety Score  Nervous, Anxious, on Edge 0 0 1 1  Control/stop worrying 0 0 0 1  Worry too much - different things 1 0 1 1  Trouble relaxing 1 0 0 0  Restless 0 0 0 0  Easily annoyed or irritable 1 1 1 1   Afraid - awful might happen 0 0 0 0  Total GAD 7 Score 3 1 3 4   Anxiety Difficulty Very difficult Not difficult at all Somewhat difficult Not difficult at all   Suicidal/Homicidal: No  Therapist Response: Clinician utilized CBT, supportive reflection, and motivational interviewing techniques in efforts to support patient in exploring presenting stressors. Actively engaged  pt in check-in, assessing presenting moods and affect. Reviewed previous PHQ-9 and GAD-7 scores, processing trends, exploring any recent instances of increased depressive or anxious sxs, determining abilities to d/c re-administering of screenings. Evoked pt's recounts of recent events, actively listening, providing support, and questioning for clarification. Introduced concepts of cognitive distortions, exploring pt's understanding and impact of negative thoughts, employing Challenging Negative Thoughts activity, to support exploration of thoughts and perspectives.   Clinician reassessed severity of sxs, and presence of any safety concerns. Clinician provided support and empathy to patient during session.   Plan: Return again in 2 weeks.  Diagnosis: Adjustment disorder with mixed anxiety and depressed mood  Collaboration of Care: Other None deemed necessary at this time.  Patient/Guardian was advised Release of Information must be obtained prior to any record release in order to collaborate their care with an outside provider. Patient/Guardian was advised if they have not already done so to contact the registration department to sign all necessary forms in order for us  to release information regarding their care.   Consent: Patient/Guardian gives verbal consent for treatment and assignment of benefits for services provided during this visit. Patient/Guardian expressed understanding and agreed to proceed.   Lynwood JONETTA Maris, MSW, LCSW 12/03/2023,  10:08 AM

## 2023-12-09 ENCOUNTER — Other Ambulatory Visit: Payer: Self-pay

## 2023-12-13 ENCOUNTER — Other Ambulatory Visit: Payer: Self-pay | Admitting: Family Medicine

## 2023-12-13 DIAGNOSIS — E119 Type 2 diabetes mellitus without complications: Secondary | ICD-10-CM

## 2023-12-14 ENCOUNTER — Other Ambulatory Visit: Payer: Self-pay

## 2023-12-14 MED ORDER — ONETOUCH VERIO VI STRP
ORAL_STRIP | 2 refills | Status: AC
  Filled 2023-12-14: qty 100, 30d supply, fill #0
  Filled 2024-08-02: qty 100, 30d supply, fill #1
  Filled 2024-11-29: qty 100, 90d supply, fill #2

## 2023-12-15 ENCOUNTER — Other Ambulatory Visit: Payer: Self-pay

## 2023-12-17 ENCOUNTER — Ambulatory Visit (HOSPITAL_COMMUNITY): Payer: Commercial Managed Care - HMO | Admitting: Licensed Clinical Social Worker

## 2023-12-29 ENCOUNTER — Other Ambulatory Visit (HOSPITAL_COMMUNITY): Payer: Self-pay

## 2023-12-29 ENCOUNTER — Other Ambulatory Visit: Payer: Self-pay

## 2023-12-31 ENCOUNTER — Ambulatory Visit (INDEPENDENT_AMBULATORY_CARE_PROVIDER_SITE_OTHER): Payer: Commercial Managed Care - HMO | Admitting: Licensed Clinical Social Worker

## 2023-12-31 DIAGNOSIS — F4323 Adjustment disorder with mixed anxiety and depressed mood: Secondary | ICD-10-CM | POA: Diagnosis not present

## 2023-12-31 NOTE — Progress Notes (Signed)
 THERAPIST PROGRESS NOTE   Session Date: 12/31/2023  Session Time: 1005-1100  Participation Level: Active  Behavioral Response: Casual and Well GroomedAlertDepressed and Euthymic  Type of Therapy: Individual Therapy  Treatment Goals addressed:  - Report a decrease in anxiety symptoms as evidenced by an overall reduction in anxiety score by a minimum of 25% on the Generalized Anxiety Disorder Scale (GAD-7)  - Reduce frequency, intensity, and duration of depression symptoms so that daily functioning is improved  - Increase coping skills to manage depression and improve ability to perform daily activities  - Mallory Wall will identify cognitive patterns and beliefs that support depression   ProgressTowards Goals: Progressing  Interventions: CBT, Motivational Interviewing, and Supportive  Summary: Mallory Wall is a 63 year old female with past psych hx of Adjustment D/O w/ mixed Anxiety and Depressed mood, presenting for follow up therapy session for support in the management of depressive and anxious sxs.   Pt actively engaged in session, presenting in depressed moods, and congruent affect throughout. Pt openly engaged in check-in, sharing of having not been in the best moods as of late, further clarifying of having noticed variances in moods over the past 2-3 weeks, in accordance with getting sick with respiratory infection/illness. Pt proved receptive to reassessing depressive and anxious sxs via PHQ-9 and GAD-7, based on reported worsening moods over the past month, engaging in review of current scores, noted sxs, comparing to previous scores, and further processing thoughts and feelings in relation to sxs being further exacerbated due to illness. - Pt further shared continued stress surrounding living arrangements with niece continuing to reside with her and not proving to provide any financial support, with maintained avoidance of interactions between each of them. Further processed thoughts and  feelings in relation to interactions and status of relationship, identifying pain and hurt experienced as pt would not have expected such behaviors from her own family. - Actively engaged in further processing pt's current status of addressing concerns, having written letter to niece detailing the current needs regarding the matter, further sharing of next steps pt feels to be necessary if her niece does not prove to begin to contribute to responsibilities.  Patient responded well to interventions. Patient continues to meet criteria for Adjustment Disorder w/ mixed anxiety and depressed moods. Patient will continue to benefit from engagement in outpatient therapy due to being the least restrictive service to meet presenting needs.       12/31/2023   10:09 AM 11/04/2023   11:30 AM 10/19/2023   11:09 AM 09/09/2023   10:09 AM 08/27/2023    3:14 PM  Depression screen PHQ 2/9  Decreased Interest 2 0 1 0 0  Down, Depressed, Hopeless 1 1 0 1 1  PHQ - 2 Score 3 1 1 1 1   Altered sleeping 1 1 0 0 0  Tired, decreased energy 2 0 1 1 1   Change in appetite 2 0 0 0 1  Feeling bad or failure about yourself  0 0 0 0 0  Trouble concentrating 0 0 0 0 0  Moving slowly or fidgety/restless 0 0 0 0 0  Suicidal thoughts 0 0 0 0 0  PHQ-9 Score 8 2 2 2 3   Difficult doing work/chores Somewhat difficult Somewhat difficult Not difficult at all Somewhat difficult Not difficult at all      12/31/2023   10:11 AM 11/04/2023   11:28 AM 10/19/2023   11:06 AM 09/09/2023   10:07 AM  GAD 7 : Generalized Anxiety Score  Nervous, Anxious, on  Edge 1 0 0 1  Control/stop worrying 1 0 0 0  Worry too much - different things 1 1 0 1  Trouble relaxing 0 1 0 0  Restless 0 0 0 0  Easily annoyed or irritable 1 1 1 1   Afraid - awful might happen 0 0 0 0  Total GAD 7 Score 4 3 1 3   Anxiety Difficulty Somewhat difficult Very difficult Not difficult at all Somewhat difficult   Suicidal/Homicidal: No  Therapist Response: Clinician  utilized CBT, supportive reflection, and motivational interviewing techniques in efforts to support patient in exploring presenting stressors.    - Clinician engaged pt in introductory check-in, actively listening to pt's details of recent observations of poor moods, evoking contributing factors, and supporting pt in reflecting on recent experiences of respiratory illness and impact of depressive sxs. Reassessed presenting depressive and anxious sxs over the past two weeks via PHQ-9 and GAD-7, engaging pt further in processing of minor variances in anxious sxs, and greater variance in depressive sxs, further exploring those primarily related to respiratory illness and supporting pt in navigating thoughts and perspectives related to sxs. Further provided pt with support in processing continued stress surrounding living situation with family member, eliciting further thoughts through utilizing socratic question to support pt in processing thoughts and potential future outcomes and/or factors. Encouraged pt to follow up with PCP if still experiencing flu-like sxs in 48-72 hrs.  Clinician reassessed severity of sxs, and presence of any safety concerns. Clinician provided support and empathy to patient during session.   Plan: Return again in 2 weeks.  Diagnosis: Adjustment disorder with mixed anxiety and depressed mood  Collaboration of Care: Other None deemed necessary at this time.  Patient/Guardian was advised Release of Information must be obtained prior to any record release in order to collaborate their care with an outside provider. Patient/Guardian was advised if they have not already done so to contact the registration department to sign all necessary forms in order for [place] to release information regarding their care.   Consent: Patient/Guardian gives verbal consent for treatment and assignment of benefits for services provided during this visit. Patient/Guardian expressed understanding and  agreed to proceed.   Leisa Lenz, MSW, LCSW 12/31/2023,  11:01 AM

## 2024-01-01 ENCOUNTER — Other Ambulatory Visit: Payer: Self-pay

## 2024-01-04 ENCOUNTER — Other Ambulatory Visit: Payer: Self-pay

## 2024-01-14 ENCOUNTER — Ambulatory Visit (INDEPENDENT_AMBULATORY_CARE_PROVIDER_SITE_OTHER): Payer: Commercial Managed Care - HMO | Admitting: Licensed Clinical Social Worker

## 2024-01-14 DIAGNOSIS — F4323 Adjustment disorder with mixed anxiety and depressed mood: Secondary | ICD-10-CM | POA: Diagnosis not present

## 2024-01-14 NOTE — Progress Notes (Unsigned)
 THERAPIST PROGRESS NOTE   Session Date: 01/14/2024  Session Time: 1110-1200  Participation Level: Active  Behavioral Response: CasualAlertDysphoric  Type of Therapy: Individual Therapy  Treatment Goals addressed:  - Report a decrease in anxiety symptoms as evidenced by an overall reduction in anxiety score by a minimum of 25% on the Generalized Anxiety Disorder Scale (GAD-7)  - Reduce frequency, intensity, and duration of depression symptoms so that daily functioning is improved  - Increase coping skills to manage depression and improve ability to perform daily activities  - Graciella Belton will identify cognitive patterns and beliefs that support depression   ProgressTowards Goals: Progressing  Interventions: CBT, Motivational Interviewing, and Supportive  Summary: Graciella Belton is a 63 year old female with past psych hx of Adjustment D/O w/ mixed Anxiety and Depressed mood, presenting for follow up therapy session for support in the management of depressive and anxious sxs.   -Patient actively engaged in session, presenting and dysphoric moods with congruent affect, and intermittent periods of irritability throughout visit.  Patient openly engaged in check-in, sharing of things generally being the same for her at this time, further expounding on recent interactions with adults great niece, whom has been residing in her home for close to 2 years.  Patient detailed of having given niece the letter that she had written to her regarding her concerns surrounding lack of financial support from niece towards household throughout the duration of time that niece has been residing in her home, sharing of nieces response of having disregarded her letter, and attempted to manipulate patient by claiming that patient does not care about her, does not understand what she is going through, and is always talking about her behind her back.  Patient actively processed nieces attempts at gaslighting as means of manipulation  to deflect, and minimize patient's expressed concerns.  Patient openly shared identified feelings of being mad, and hurt at the feelings her niece had made claims of, further expressing of having being the only family member to help niece.  Patient shared additional thoughts regarding situation, actively processing irrational thoughts, identifying more rational thinking.  Patient shared of still being conflicted and whether she should pursue eviction proceedings or simply wait until niece decides to leave on her own.  Patient responded well to interventions. Patient continues to meet criteria for Adjustment Disorder w/ mixed anxiety and depressed moods. Patient will continue to benefit from engagement in outpatient therapy due to being the least restrictive service to meet presenting needs.       01/14/2024   11:56 AM 12/31/2023   10:09 AM 11/04/2023   11:30 AM 10/19/2023   11:09 AM 09/09/2023   10:09 AM  Depression screen PHQ 2/9  Decreased Interest 1 2 0 1 0  Down, Depressed, Hopeless 1 1 1  0 1  PHQ - 2 Score 2 3 1 1 1   Altered sleeping 0 1 1 0 0  Tired, decreased energy 1 2 0 1 1  Change in appetite 0 2 0 0 0  Feeling bad or failure about yourself  0 0 0 0 0  Trouble concentrating 0 0 0 0 0  Moving slowly or fidgety/restless 0 0 0 0 0  Suicidal thoughts 0 0 0 0 0  PHQ-9 Score 3 8 2 2 2   Difficult doing work/chores Somewhat difficult Somewhat difficult Somewhat difficult Not difficult at all Somewhat difficult      01/14/2024   11:54 AM 12/31/2023   10:11 AM 11/04/2023   11:28 AM 10/19/2023   11:06 AM  GAD 7 : Generalized Anxiety Score  Nervous, Anxious, on Edge 1 1 0 0  Control/stop worrying 1 1 0 0  Worry too much - different things 1 1 1  0  Trouble relaxing 1 0 1 0  Restless 0 0 0 0  Easily annoyed or irritable 1 1 1 1   Afraid - awful might happen 0 0 0 0  Total GAD 7 Score 5 4 3 1   Anxiety Difficulty Somewhat difficult Somewhat difficult Very difficult Not difficult at all    Suicidal/Homicidal: No  Therapist Response: Clinician utilized CBT, supportive reflection, and motivational interviewing techniques in efforts to support patient in exploring presenting stressors.    -Actively engaged patient in introductory check-in, greeting patient and assessing presenting moods and affect, eliciting details of events of past 2 weeks since previous session, actively listening to detailed recounts of recent stressors proving to impact the patient's moods.  Further evoked patient's thoughts, feelings, and perspectives in relation to recent events, and challenging interactions within the home, validating patient's expressed feelings and support.  Utilized CBT and MI techniques to explore thoughts, feelings, and behavioral responses to recent stressors, and employed psychoeducation to assist patient in further processing and understanding recent events.  Utilized Socratic questioning to assist patient in further processing thoughts surrounding recent events.  Clinician reassessed severity of sxs, and presence of any safety concerns. Clinician provided support and empathy to patient during session.   Plan: Return again in 3 weeks.  Diagnosis: Adjustment disorder with mixed anxiety and depressed mood  Collaboration of Care: Other None deemed necessary at this time.  Patient/Guardian was advised Release of Information must be obtained prior to any record release in order to collaborate their care with an outside provider. Patient/Guardian was advised if they have not already done so to contact the registration department to sign all necessary forms in order for [place] to release information regarding their care.   Consent: Patient/Guardian gives verbal consent for treatment and assignment of benefits for services provided during this visit. Patient/Guardian expressed understanding and agreed to proceed.   Leisa Lenz, MSW, LCSW 01/14/2024,  11:57 AM

## 2024-01-21 ENCOUNTER — Other Ambulatory Visit: Payer: Self-pay

## 2024-02-02 ENCOUNTER — Other Ambulatory Visit: Payer: Self-pay

## 2024-02-03 ENCOUNTER — Ambulatory Visit (HOSPITAL_COMMUNITY): Admitting: Licensed Clinical Social Worker

## 2024-02-03 DIAGNOSIS — F4323 Adjustment disorder with mixed anxiety and depressed mood: Secondary | ICD-10-CM

## 2024-02-03 NOTE — Progress Notes (Unsigned)
 THERAPIST PROGRESS NOTE   Session Date: 02/03/2024  Session Time: 1610-9604  Participation Level: Active  Behavioral Response: CasualAlertDysphoric  Type of Therapy: Individual Therapy  Treatment Goals addressed:  - Report a decrease in anxiety symptoms as evidenced by an overall reduction in anxiety score by a minimum of 25% on the Generalized Anxiety Disorder Scale (GAD-7)  - Reduce frequency, intensity, and duration of depression symptoms so that daily functioning is improved  - Increase coping skills to manage depression and improve ability to perform daily activities  - Mallory Wall will identify cognitive patterns and beliefs that support depression   ProgressTowards Goals: Progressing  Interventions: CBT, Motivational Interviewing, and Supportive  Summary: Mallory Wall is a 63 year old female with past psych hx of Adjustment D/O w/ mixed Anxiety and Depressed mood, presenting for follow up therapy session for support in the management of depressive and anxious sxs.   Patient actively engaged in session presenting in dysphoric moods and flat affect, with brief instances of brightening throughout duration of visit.  Patient openly engaged in introductory check-in, sharing of things being up and down, expressing of being tired of having to work so much and not see any benefits or abilities to maintain funds after having paid all the bills.  Patient shared of having considered potentially retiring early with plans to advocate retirement savings and consider options.  Patient actively engaged in reassessing presenting depressive and anxious symptoms experienced over the recent weeks, engaging further in discussion of observe symptoms, further elaborating on minor increase in anxious symptoms specific to worries being primarily related to finances.  Patient detailed of working between 60-72+ hours a week in order to cover rent, utilities, and other living expenses, expressing frustration surrounding  nieces continued disregard and lack of financial support towards the household.  Further explored patient's feelings surrounding inabilities to turn to the family for support and feelings of care and support not being reciprocated when patient has historically cared and supported extended family members.  Actively processed difficulties surrounding sleep patterns and implications on moods, exploring the benefits to beginning utilizing a thought journal to support patient in documenting thoughts and worries that may prove to keep her up at night to support relaxation and improved sleep.  Patient responded well to interventions. Patient continues to meet criteria for Adjustment Disorder w/ mixed anxiety and depressed moods. Patient will continue to benefit from engagement in outpatient therapy due to being the least restrictive service to meet presenting needs.       02/03/2024    2:18 PM 01/14/2024   11:56 AM 12/31/2023   10:09 AM 11/04/2023   11:30 AM 10/19/2023   11:09 AM  Depression screen PHQ 2/9  Decreased Interest 0 1 2 0 1  Down, Depressed, Hopeless 1 1 1 1  0  PHQ - 2 Score 1 2 3 1 1   Altered sleeping 1 0 1 1 0  Tired, decreased energy 1 1 2  0 1  Change in appetite 0 0 2 0 0  Feeling bad or failure about yourself  0 0 0 0 0  Trouble concentrating 0 0 0 0 0  Moving slowly or fidgety/restless 0 0 0 0 0  Suicidal thoughts 0 0 0 0 0  PHQ-9 Score 3 3 8 2 2   Difficult doing work/chores Somewhat difficult Somewhat difficult Somewhat difficult Somewhat difficult Not difficult at all      02/03/2024    2:15 PM 01/14/2024   11:54 AM 12/31/2023   10:11 AM 11/04/2023   11:28  AM  GAD 7 : Generalized Anxiety Score  Nervous, Anxious, on Edge 1 1 1  0  Control/stop worrying 2 1 1  0  Worry too much - different things 2 1 1 1   Trouble relaxing 0 1 0 1  Restless 0 0 0 0  Easily annoyed or irritable 1 1 1 1   Afraid - awful might happen 0 0 0 0  Total GAD 7 Score 6 5 4 3   Anxiety Difficulty Somewhat  difficult Somewhat difficult Somewhat difficult Very difficult   Suicidal/Homicidal: No  Therapist Response: Clinician utilized CBT, supportive reflection, and motivational interviewing techniques in efforts to support patient in exploring presenting stressors.    Actively greeted patient, engaging in introductory check-in, assessing presenting moods and affect, and eliciting patient's perspectives surrounding factors impacting presenting moods.  Actively engaged in reassessing presenting depressive and anxious symptoms via PHQ-9 and GAD-7, noting of no variances in depressive symptoms score, and minimal increase in anxious symptoms, further eliciting patient's recounts of observed stressors and challenges resulting in increased symptoms.  Utilized open-ended questions to further elicit patient recounts of recent events, actively listening to reflections of recent and presenting stressors and reports of financial stress, and physical and emotional fatigue, proving to increase worries and stress, having further implications on patient's sleep patterns.  Utilized Socratic questioning to further elicit critical thinking surrounding presenting stressors and challenges, and supporting patient in exploring means of addressing presenting concerns.  Clinician reassessed severity of sxs, and presence of any safety concerns. Clinician provided support and empathy to patient during session.   Plan: Return again in 3 weeks.  Diagnosis: Adjustment disorder with mixed anxiety and depressed mood  Collaboration of Care: Other None deemed necessary at this time.  Patient/Guardian was advised Release of Information must be obtained prior to any record release in order to collaborate their care with an outside provider. Patient/Guardian was advised if they have not already done so to contact the registration department to sign all necessary forms in order for [place] to release information regarding their care.    Consent: Patient/Guardian gives verbal consent for treatment and assignment of benefits for services provided during this visit. Patient/Guardian expressed understanding and agreed to proceed.   Leisa Lenz, MSW, LCSW 02/03/2024,  2:20 PM

## 2024-02-12 ENCOUNTER — Other Ambulatory Visit: Payer: Self-pay | Admitting: Nurse Practitioner

## 2024-02-12 DIAGNOSIS — Z0000 Encounter for general adult medical examination without abnormal findings: Secondary | ICD-10-CM

## 2024-02-24 ENCOUNTER — Ambulatory Visit (INDEPENDENT_AMBULATORY_CARE_PROVIDER_SITE_OTHER): Admitting: Licensed Clinical Social Worker

## 2024-02-24 ENCOUNTER — Ambulatory Visit: Payer: Commercial Managed Care - HMO | Attending: Nurse Practitioner | Admitting: Nurse Practitioner

## 2024-02-24 ENCOUNTER — Encounter: Payer: Self-pay | Admitting: Nurse Practitioner

## 2024-02-24 ENCOUNTER — Encounter (HOSPITAL_COMMUNITY): Payer: Self-pay

## 2024-02-24 VITALS — BP 139/83 | HR 82 | Resp 20 | Ht 62.0 in | Wt 157.6 lb

## 2024-02-24 DIAGNOSIS — I10 Essential (primary) hypertension: Secondary | ICD-10-CM | POA: Diagnosis not present

## 2024-02-24 DIAGNOSIS — E119 Type 2 diabetes mellitus without complications: Secondary | ICD-10-CM

## 2024-02-24 DIAGNOSIS — Z7984 Long term (current) use of oral hypoglycemic drugs: Secondary | ICD-10-CM

## 2024-02-24 DIAGNOSIS — F4323 Adjustment disorder with mixed anxiety and depressed mood: Secondary | ICD-10-CM | POA: Diagnosis not present

## 2024-02-24 DIAGNOSIS — R0609 Other forms of dyspnea: Secondary | ICD-10-CM

## 2024-02-24 NOTE — Progress Notes (Signed)
 Assessment & Plan:  Mallory Wall was seen today for hypertension.  Diagnoses and all orders for this visit:  Primary hypertension -     CMP14+EGFR Continue all antihypertensives as prescribed.  Reminded to bring in blood pressure log for follow  up appointment.  RECOMMENDATIONS: DASH/Mediterranean Diets are healthier choices for HTN.    Dyspnea on exertion -     ECHOCARDIOGRAM COMPLETE; Future  Diabetes mellitus treated with oral medication  DOSE CHANGE. Increase metformin  to 1 tablet twice per day -     Hemoglobin A1c -     metFORMIN  (GLUCOPHAGE ) 500 MG tablet; Take 1 tablet (500 mg total) by mouth 2 (two) times daily with a meal.    Patient has been counseled on age-appropriate routine health concerns for screening and prevention. These are reviewed and up-to-date. Referrals have been placed accordingly. Immunizations are up-to-date or declined.    Subjective:   Chief Complaint  Patient presents with   Hypertension    Mallory Wall 64 y.o. female presents to office today for follow up to HTN and DM  She has a past medical history of Allergy, Back pain, DM2, Gallstones, Headache, Hyperlipidemia, and Vaginal Pap smear, abnormal.    Endorses bilateral shoulder pain that radiates down into there elbows. Associated symptoms; Heaviness in her arms. Using voltaren  gel.  Pain is worse at night and when using arms to try to dry her back   DM 2 A1c increased from 7.3 to 7.7. She is currently taking metformin  500 mg with breakfast.  Lab Results  Component Value Date   HGBA1C 7.7 (H) 02/24/2024    Blood pressure at goal.  BP Readings from Last 3 Encounters:  02/24/24 139/83  11/27/23 122/84  10/10/23 (!) 160/96     Endorses stress with living situation and finances. States she has been accused of not paying her rent last November. Feels as if she is being harassed by the Sales promotion account executive and this has been increasing her stress and anxiety.   Notes shortness of breath  with increased activity and at rest.  Review of Systems  Constitutional:  Negative for fever, malaise/fatigue and weight loss.  HENT: Negative.  Negative for nosebleeds.   Eyes: Negative.  Negative for blurred vision, double vision and photophobia.  Respiratory:  Positive for shortness of breath. Negative for cough.   Cardiovascular: Negative.  Negative for chest pain, palpitations and leg swelling.  Gastrointestinal: Negative.  Negative for heartburn, nausea and vomiting.  Musculoskeletal: Negative.  Negative for myalgias.  Neurological: Negative.  Negative for dizziness, focal weakness, seizures and headaches.  Psychiatric/Behavioral: Negative.  Negative for suicidal ideas.     Past Medical History:  Diagnosis Date   Allergy    Back pain    Diabetes mellitus without complication (HCC)    Gallstones    Headache(784.0)    Hyperlipidemia    Vaginal Pap smear, abnormal     Past Surgical History:  Procedure Laterality Date   CHOLECYSTECTOMY N/A 01/13/2018   Procedure: LAPAROSCOPIC CHOLECYSTECTOMY;  Surgeon: Oza Blumenthal, MD;  Location: [facility];  Service: General;  Laterality: N/A;   NO PAST SURGERIES      Family History  Problem Relation Age of Onset   CAD Mother    Hypertension Mother    Diabetes type II Sister    Lung disease Sister    Colon cancer Neg Hx    Esophageal cancer Neg Hx    Pancreatic cancer Neg Hx    Rectal cancer Neg Hx  Stomach cancer Neg Hx    Breast cancer Neg Hx     Social History Reviewed with no changes to be made today.   Outpatient Medications Prior to Visit  Medication Sig Dispense Refill   Blood Glucose Monitoring Suppl (ONETOUCH VERIO) w/Device KIT Use to check blood sugar once daily. 1 kit 0   diclofenac  Sodium (VOLTAREN ) 1 % GEL Apply 2 g topically 4 (four) times daily. 200 g 1   famotidine  (PEPCID ) 20 MG tablet Take 1 tablet (20 mg total) by mouth daily. 90 tablet 2   glucose blood (ONETOUCH VERIO) test strip Use  to check blood sugar once daily. 100 each 2   latanoprost  (XALATAN ) 0.005 % ophthalmic solution Place 1 drop into both eyes at bedtime. 5 mL 3   lisinopril  (ZESTRIL ) 20 MG tablet Take 1 tablet (20 mg total) by mouth daily. 90 tablet 3   meloxicam  (MOBIC ) 7.5 MG tablet Take 1 tablet (7.5 mg total) by mouth daily. 90 tablet 1   methocarbamol  (ROBAXIN ) 500 MG tablet Take 1 tablet (500 mg total) by mouth 4 (four) times daily. 90 tablet 6   OneTouch Delica Lancets 33G MISC Use to check blood sugar once daily. 100 each 2   rosuvastatin  (CRESTOR ) 5 MG tablet Take 1 tablet (5 mg total) by mouth daily. 90 tablet 2   topiramate  (TOPAMAX ) 25 MG tablet Take 1-2 tablets (25-50 mg total) by mouth 2 (two) times daily. 90 tablet 1   metFORMIN  (GLUCOPHAGE ) 500 MG tablet Take 1 tablet (500 mg total) by mouth daily with breakfast. 90 tablet 1   No facility-administered medications prior to visit.    No Known Allergies     Objective:    BP 139/83 (BP Location: Left Arm, Patient Position: Sitting, Cuff Size: Normal)   Pulse 82   Resp 20   Ht 5\' 2"  (1.575 m)   Wt 157 lb 9.6 oz (71.5 kg)   SpO2 100%   BMI 28.83 kg/m  Wt Readings from Last 3 Encounters:  02/24/24 157 lb 9.6 oz (71.5 kg)  11/27/23 156 lb 12.8 oz (71.1 kg)  08/26/23 154 lb 3.2 oz (69.9 kg)    Physical Exam Vitals and nursing note reviewed.  Constitutional:      Appearance: She is well-developed.  HENT:     Head: Normocephalic and atraumatic.  Cardiovascular:     Rate and Rhythm: Normal rate and regular rhythm.     Heart sounds: Normal heart sounds. No murmur heard.    No friction rub. No gallop.  Pulmonary:     Effort: Pulmonary effort is normal. No tachypnea or respiratory distress.     Breath sounds: Normal breath sounds. No decreased breath sounds, wheezing, rhonchi or rales.  Chest:     Chest wall: No tenderness.  Abdominal:     General: Bowel sounds are normal.     Palpations: Abdomen is soft.  Musculoskeletal:         General: Normal range of motion.     Cervical back: Normal range of motion.  Skin:    General: Skin is warm and dry.  Neurological:     Mental Status: She is alert and oriented to person, place, and time.     Coordination: Coordination normal.  Psychiatric:        Behavior: Behavior normal. Behavior is cooperative.        Thought Content: Thought content normal.        Judgment: Judgment normal.  Patient has been counseled extensively about nutrition and exercise as well as the importance of adherence with medications and regular follow-up. The patient was given clear instructions to go to ER or return to medical center if symptoms don't improve, worsen or new problems develop. The patient verbalized understanding.   Follow-up: Return in about 3 months (around 05/25/2024).   Collins Dean, FNP-BC [facility] [place], [place] [phone]   02/25/2024, 11:36 PM

## 2024-02-24 NOTE — Progress Notes (Signed)
 THERAPIST PROGRESS NOTE   Session Date: 02/24/2024  Session Time: 1510-1550  Participation Level: Active  Behavioral Response: CasualAlertDysphoric  Type of Therapy: Individual Therapy  Treatment Goals addressed:  - Report a decrease in anxiety symptoms as evidenced by an overall reduction in anxiety score by a minimum of 25% on the Generalized Anxiety Disorder Scale (GAD-7) (MET) - Reduce frequency, intensity, and duration of depression symptoms so that daily functioning is improved  - Increase coping skills to manage depression and improve ability to perform daily activities  - Tanyell will identify cognitive patterns and beliefs that support depression   ProgressTowards Goals: Progressing  Interventions: CBT, Motivational Interviewing, and Supportive  Summary: Adalyna is a 63 year old female with past psych hx of Adjustment D/O w/ mixed Anxiety and Depressed mood, presenting for follow up therapy session for support in the management of depressive and anxious sxs.   Patient actively engaged in session, presenting in depressed moods and flat affect, actively engaging in introductory check-in, sharing of contributing factors presenting to the variances of moods.  The patient actively detailed continued stress surrounding housing situation, and increased stress over recent weeks due to apartment complex property management company making plans of having not received rent for the month of November 24, and continuing to send patient multiple emails prompting that she vacate urgently.  Actively processed presenting stress and factors proving to support patient in reducing anxious and negative thoughts such as availability of proof of payment.  Actively explored availability of alternate supports available to utilize in the future, if in the event rental situation does not prove to resolve in a desirable manner.  Actively engage patient in reassessing presenting depressive and anxious symptoms via  PHQ-9 and GAD-7, further reflecting on comparison of current versus prior scores in areas of observed symptoms, acknowledging maintained reduction in symptoms in consideration of increased stressors as of late.  Actively engaged in 21-month review of individualized treatment goals outlined in treatment plan specific to the management of depressive and anxious symptoms, noting of having met goals specific to anxious symptoms, with depressive goals being applicable to continued areas for work.  Patient responded well to interventions. Patient continues to meet criteria for Adjustment Disorder w/ mixed anxiety and depressed moods. Patient will continue to benefit from engagement in outpatient therapy due to being the least restrictive service to meet presenting needs.       02/24/2024   10:50 AM 02/03/2024    2:18 PM 01/14/2024   11:56 AM 12/31/2023   10:09 AM 11/04/2023   11:30 AM  Depression screen PHQ 2/9  Decreased Interest 1 0 1 2 0  Down, Depressed, Hopeless 1 1 1 1 1   PHQ - 2 Score 2 1 2 3 1   Altered sleeping 0 1 0 1 1  Tired, decreased energy 1 1 1 2  0  Change in appetite 0 0 0 2 0  Feeling bad or failure about yourself  0 0 0 0 0  Trouble concentrating 0 0 0 0 0  Moving slowly or fidgety/restless 0 0 0 0 0  Suicidal thoughts 0 0 0 0 0  PHQ-9 Score 3 3 3 8 2   Difficult doing work/chores Not difficult at all Somewhat difficult Somewhat difficult Somewhat difficult Somewhat difficult      02/24/2024   10:50 AM 02/03/2024    2:15 PM 01/14/2024   11:54 AM 12/31/2023   10:11 AM  GAD 7 : Generalized Anxiety Score  Nervous, Anxious, on Edge 1 1 1  1  Control/stop worrying 1 2 1 1   Worry too much - different things 1 2 1 1   Trouble relaxing 1 0 1 0  Restless 0 0 0 0  Easily annoyed or irritable 0 1 1 1   Afraid - awful might happen 0 0 0 0  Total GAD 7 Score 4 6 5 4   Anxiety Difficulty Somewhat difficult Somewhat difficult Somewhat difficult Somewhat difficult   Suicidal/Homicidal:  No  Therapist Response: Clinician utilized CBT, supportive reflection, and motivational interviewing techniques in efforts to support patient in exploring presenting stressors.   Openly greeted patient, engaging in introductory check-in, assessing presenting moods and affect, and evoking patient's thoughts and perspectives related to contributing factors for presenting moods.  Actively listened to patient's reflections of recent events and factors proving to increase stressors and challenges over recent weeks, providing support and validation of feelings.  Supported patient and actively processing thoughts and feelings, utilizing Socratic questioning to further prompt greater critical exploration of identified thoughts and feelings surrounding presenting stress, and further aided patient in navigating steps she may be able to take in resolving challenges.  Reassess depressive and anxious symptoms via PHQ-9 and GAD-7, noting of continued reduction in observed symptoms and consideration of increased presenting stressors.  Engaged in a 56-month review of individualized treatment goals outlined in treatment plan specific to the management of depression and anxiety, eliciting patient's perspectives surrounding progress and continued areas for work.  Clinician reassessed severity of sxs, and presence of any safety concerns. Clinician provided support and empathy to patient during session.   Plan: Return again in 3 weeks.  Diagnosis: Adjustment disorder with mixed anxiety and depressed mood  Collaboration of Care: Other None deemed necessary at this time.  Patient/Guardian was advised Release of Information must be obtained prior to any record release in order to collaborate their care with an outside provider. Patient/Guardian was advised if they have not already done so to contact the registration department to sign all necessary forms in order for us  to release information regarding their care.   Consent:  Patient/Guardian gives verbal consent for treatment and assignment of benefits for services provided during this visit. Patient/Guardian expressed understanding and agreed to proceed.   Patsi Boots, MSW, LCSW 02/24/2024,  3:11 PM

## 2024-02-25 ENCOUNTER — Encounter: Payer: Self-pay | Admitting: Nurse Practitioner

## 2024-02-25 LAB — CMP14+EGFR
ALT: 8 IU/L (ref 0–32)
AST: 12 IU/L (ref 0–40)
Albumin: 4.2 g/dL (ref 3.9–4.9)
Alkaline Phosphatase: 92 IU/L (ref 44–121)
BUN/Creatinine Ratio: 22 (ref 12–28)
BUN: 12 mg/dL (ref 8–27)
Bilirubin Total: <0.2 mg/dL (ref 0.0–1.2)
CO2: 23 mmol/L (ref 20–29)
Calcium: 9.4 mg/dL (ref 8.7–10.3)
Chloride: 104 mmol/L (ref 96–106)
Creatinine, Ser: 0.55 mg/dL — ABNORMAL LOW (ref 0.57–1.00)
Globulin, Total: 2.8 g/dL (ref 1.5–4.5)
Glucose: 107 mg/dL — ABNORMAL HIGH (ref 70–99)
Potassium: 4.6 mmol/L (ref 3.5–5.2)
Sodium: 142 mmol/L (ref 134–144)
Total Protein: 7 g/dL (ref 6.0–8.5)
eGFR: 104 mL/min/{1.73_m2} (ref >59)

## 2024-02-25 LAB — HEMOGLOBIN A1C
Est. average glucose Bld gHb Est-mCnc: 174 mg/dL
Hgb A1c MFr Bld: 7.7 % — ABNORMAL HIGH (ref 4.8–5.6)

## 2024-02-25 MED ORDER — METFORMIN HCL 500 MG PO TABS
500.0000 mg | ORAL_TABLET | Freq: Two times a day (BID) | ORAL | 1 refills | Status: DC
  Filled 2024-02-25: qty 60, 30d supply, fill #0
  Filled 2024-03-30: qty 60, 30d supply, fill #1
  Filled 2024-05-01: qty 60, 30d supply, fill #2
  Filled 2024-06-05: qty 60, 30d supply, fill #3
  Filled 2024-07-04: qty 60, 30d supply, fill #4
  Filled 2024-08-02: qty 60, 30d supply, fill #5

## 2024-02-26 ENCOUNTER — Other Ambulatory Visit: Payer: Self-pay

## 2024-03-01 ENCOUNTER — Other Ambulatory Visit: Payer: Self-pay

## 2024-03-03 ENCOUNTER — Ambulatory Visit
Admission: RE | Admit: 2024-03-03 | Discharge: 2024-03-03 | Disposition: A | Discharge status: Home / Self Care | Source: Ambulatory Visit | Attending: Nurse Practitioner | Admitting: Nurse Practitioner

## 2024-03-03 ENCOUNTER — Other Ambulatory Visit: Payer: Self-pay | Admitting: Nurse Practitioner

## 2024-03-03 DIAGNOSIS — N644 Mastodynia: Secondary | ICD-10-CM

## 2024-03-03 DIAGNOSIS — Z0000 Encounter for general adult medical examination without abnormal findings: Secondary | ICD-10-CM

## 2024-03-04 ENCOUNTER — Other Ambulatory Visit: Payer: Self-pay

## 2024-03-16 ENCOUNTER — Ambulatory Visit (HOSPITAL_COMMUNITY): Admitting: Licensed Clinical Social Worker

## 2024-03-16 DIAGNOSIS — F4323 Adjustment disorder with mixed anxiety and depressed mood: Secondary | ICD-10-CM

## 2024-03-16 NOTE — Progress Notes (Signed)
 THERAPIST PROGRESS NOTE   Session Date: 03/16/2024  Session Time: 1108 - 1156  Participation Level: Active  Behavioral Response: CasualAlertDysphoric  Type of Therapy: Individual Therapy  Treatment Goals addressed:  - Reduce frequency, intensity, and duration of depression symptoms so that daily functioning is improved  - Increase coping skills to manage depression and improve ability to perform daily activities  - Rosanne will identify cognitive patterns and beliefs that support depression   ProgressTowards Goals: Progressing  Interventions: CBT, Motivational Interviewing, and Supportive  Summary: Kialee is a 63 year old female with past psych hx of Adjustment D/O w/ mixed Anxiety and Depressed mood, presenting for follow up therapy session for support in the management of depressive and anxious sxs.   Patient actively engaged in session, presenting in overall pleasant moods and congruent affect, actively engaging in introductory check-in, sharing of "doing alright", further detailing recent events and factors contributing to presenting moods. Pt engaged in review of prior stressors, noted in previous session surrounding property management company having discrepancies in prior paid rent, further processing resolution and reflection of increased stress experienced over the past month surrounding uncertainty of potential outcome, and increased health complications as a result of stress to include increased BP, A1C, and having experienced breast pain preventing abilities to complete recent mammogram.  Patient further engaged in exploration of eating habits and those eating habits observed over recent weeks in relation to increased stressors, engaging in further processing of relationship between appetite and depressed moods.  Patient expressed intent to connect with PCP to explore possibility of referral for nutritionist and/or dietitian to support in navigating dietary concerns in accordance  with concerns surrounding increased BP and A1c.  Patient responded well to interventions. Patient continues to meet criteria for Adjustment Disorder w/ mixed anxiety and depressed moods. Patient will continue to benefit from engagement in outpatient therapy due to being the least restrictive service to meet presenting needs.       02/24/2024   10:50 AM 02/03/2024    2:18 PM 01/14/2024   11:56 AM 12/31/2023   10:09 AM 11/04/2023   11:30 AM  Depression screen PHQ 2/9  Decreased Interest 1 0 1 2 0  Down, Depressed, Hopeless 1 1 1 1 1   PHQ - 2 Score 2 1 2 3 1   Altered sleeping 0 1 0 1 1  Tired, decreased energy 1 1 1 2  0  Change in appetite 0 0 0 2 0  Feeling bad or failure about yourself  0 0 0 0 0  Trouble concentrating 0 0 0 0 0  Moving slowly or fidgety/restless 0 0 0 0 0  Suicidal thoughts 0 0 0 0 0  PHQ-9 Score 3 3 3 8 2   Difficult doing work/chores Not difficult at all Somewhat difficult Somewhat difficult Somewhat difficult Somewhat difficult      02/24/2024   10:50 AM 02/03/2024    2:15 PM 01/14/2024   11:54 AM 12/31/2023   10:11 AM  GAD 7 : Generalized Anxiety Score  Nervous, Anxious, on Edge 1 1 1 1   Control/stop worrying 1 2 1 1   Worry too much - different things 1 2 1 1   Trouble relaxing 1 0 1 0  Restless 0 0 0 0  Easily annoyed or irritable 0 1 1 1   Afraid - awful might happen 0 0 0 0  Total GAD 7 Score 4 6 5 4   Anxiety Difficulty Somewhat difficult Somewhat difficult Somewhat difficult Somewhat difficult   Suicidal/Homicidal: No  Therapist Response:  Clinician utilized CBT, supportive reflection, and motivational interviewing techniques in efforts to support patient in exploring presenting stressors.   Clinician actively greeted patient upon presenting for today's visit, assessing presenting moods and affect, and further prompting patient's engagement in reflection of recent events and factors contributing to presenting moods.  Actively listened to patient's reflections of  events, further evoking patient's recent efforts and abilities at navigating previously identified stressors surrounding discrepancies and rents with property management company.  Clinician utilized open-ended questions to support patient in navigating experienced stress surrounding recent events and challenges, actively listening to patient's reports of observed implications with physical health as a result of stressors.  Actively engaged patient in exploration of dietary factors and implications on moods, utilizing CBT, psychoeducation, motivational interviewing interventions to provide patient with information evidence to the relationship of food and moods, and processed patient's efforts and/or readiness to make necessary adjustments to diet.  Clinician reassessed severity of sxs, and presence of any safety concerns. Clinician provided support and empathy to patient during session.   Plan: Return again in 3 weeks.  Diagnosis: Adjustment disorder with mixed anxiety and depressed mood  Collaboration of Care: Other None deemed necessary at this time.  Patient/Guardian was advised Release of Information must be obtained prior to any record release in order to collaborate their care with an outside provider. Patient/Guardian was advised if they have not already done so to contact the registration department to sign all necessary forms in order for us  to release information regarding their care.   Consent: Patient/Guardian gives verbal consent for treatment and assignment of benefits for services provided during this visit. Patient/Guardian expressed understanding and agreed to proceed.   Patsi Boots, MSW, LCSW 03/16/2024,  11:08 AM

## 2024-03-23 ENCOUNTER — Ambulatory Visit
Admission: RE | Admit: 2024-03-23 | Discharge: 2024-03-23 | Disposition: A | Discharge status: Home / Self Care | Source: Ambulatory Visit | Attending: Nurse Practitioner | Admitting: Nurse Practitioner

## 2024-03-23 ENCOUNTER — Ambulatory Visit: Payer: Self-pay | Admitting: Nurse Practitioner

## 2024-03-23 ENCOUNTER — Ambulatory Visit
Admission: RE | Admit: 2024-03-23 | Discharge: 2024-03-23 | Disposition: A | Discharge status: Home / Self Care | Source: Ambulatory Visit | Attending: Nurse Practitioner

## 2024-03-23 DIAGNOSIS — N644 Mastodynia: Secondary | ICD-10-CM

## 2024-03-28 ENCOUNTER — Other Ambulatory Visit (HOSPITAL_COMMUNITY)

## 2024-03-30 ENCOUNTER — Ambulatory Visit (HOSPITAL_COMMUNITY)
Admission: RE | Admit: 2024-03-30 | Discharge: 2024-03-30 | Disposition: A | Discharge status: Home / Self Care | Source: Ambulatory Visit | Attending: Nurse Practitioner | Admitting: Nurse Practitioner

## 2024-03-30 ENCOUNTER — Ambulatory Visit: Payer: Self-pay | Admitting: Nurse Practitioner

## 2024-03-30 DIAGNOSIS — R0609 Other forms of dyspnea: Secondary | ICD-10-CM | POA: Insufficient documentation

## 2024-03-30 DIAGNOSIS — I358 Other nonrheumatic aortic valve disorders: Secondary | ICD-10-CM | POA: Diagnosis not present

## 2024-03-30 DIAGNOSIS — I517 Cardiomegaly: Secondary | ICD-10-CM | POA: Insufficient documentation

## 2024-03-30 LAB — ECHOCARDIOGRAM COMPLETE
Area-P 1/2: 3.72 cm2
S' Lateral: 2.5 cm

## 2024-04-04 ENCOUNTER — Other Ambulatory Visit: Payer: Self-pay

## 2024-04-06 ENCOUNTER — Ambulatory Visit (HOSPITAL_COMMUNITY): Admitting: Licensed Clinical Social Worker

## 2024-04-06 DIAGNOSIS — F4323 Adjustment disorder with mixed anxiety and depressed mood: Secondary | ICD-10-CM | POA: Diagnosis not present

## 2024-04-06 NOTE — Progress Notes (Signed)
 THERAPIST PROGRESS NOTE   Session Date: 04/06/2024  Session Time: 1116 - 1206  Participation Level: Active  Behavioral Response: CasualAlertEuthymic  Type of Therapy: Individual Therapy  Treatment Goals addressed:  - Reduce frequency, intensity, and duration of depression symptoms so that daily functioning is improved  - Increase coping skills to manage depression and improve ability to perform daily activities  - Kanasia will identify cognitive patterns and beliefs that support depression   ProgressTowards Goals: Progressing  Interventions: CBT, Motivational Interviewing, and Supportive  Summary: Mallory Wall is a 63 year old female with past psych hx of Adjustment D/O w/ mixed Anxiety and Depressed mood, presenting for follow up therapy session for support in the management of depressive and anxious sxs.   Patient actively engaged in session, presenting in overall pleasant moods and congruent affect, actively engaging in introductory check-in, sharing of it's been okay, further detailing recent events and factors contributing to presenting moods. Pt further engaged in recounts of recent events of the past three weeks, sharing of having received mammogram and cardiology results, noting of no concerns, further processing how this proved to reduce stress and anxiousness related to ongoing medical issues. Pt actively engaged in reassessing presenting depressive and anxious sxs via PHQ-9 and GAD-7, engaging in processing of consistently low scores and abilities to d/c administering of screenings unless otherwise warranted in the future due to increased experienced sxs. Pt further engaged in processing of negative cognitive patterns that lead pt to focus on negativity and challenging events currently experienced with adult niece residing in her home and challenging employees at workplace, further processing alternate approaches at managing and/or approaching stressors.  Patient responded well to  interventions. Patient continues to meet criteria for Adjustment Disorder w/ mixed anxiety and depressed moods. Patient will continue to benefit from engagement in outpatient therapy due to being the least restrictive service to meet presenting needs.      04/06/2024   11:20 AM 02/24/2024   10:50 AM 02/03/2024    2:18 PM 01/14/2024   11:56 AM 12/31/2023   10:09 AM  Depression screen PHQ 2/9  Decreased Interest 0 1 0 1 2  Down, Depressed, Hopeless 1 1 1 1 1   PHQ - 2 Score 1 2 1 2 3   Altered sleeping 0 0 1 0 1  Tired, decreased energy 1 1 1 1 2   Change in appetite 0 0 0 0 2  Feeling bad or failure about yourself  0 0 0 0 0  Trouble concentrating 0 0 0 0 0  Moving slowly or fidgety/restless 0 0 0 0 0  Suicidal thoughts 0 0 0 0 0  PHQ-9 Score 2 3 3 3 8   Difficult doing work/chores Somewhat difficult Not difficult at all Somewhat difficult Somewhat difficult Somewhat difficult      04/06/2024   11:18 AM 02/24/2024   10:50 AM 02/03/2024    2:15 PM 01/14/2024   11:54 AM  GAD 7 : Generalized Anxiety Score  Nervous, Anxious, on Edge 1 1 1 1   Control/stop worrying 0 1 2 1   Worry too much - different things 0 1 2 1   Trouble relaxing 0 1 0 1  Restless 0 0 0 0  Easily annoyed or irritable 0 0 1 1  Afraid - awful might happen 0 0 0 0  Total GAD 7 Score 1 4 6 5   Anxiety Difficulty Somewhat difficult Somewhat difficult Somewhat difficult Somewhat difficult     Suicidal/Homicidal: No  Therapist Response: Clinician utilized CBT, supportive reflection, and  motivational interviewing techniques in efforts to support patient in exploring presenting stressors.   Clinician actively greeted patient upon presenting for today's visit, assessing presenting moods and affect, further engaging patient in introductory check-in, and actively listening to patient's recounts of events and factors of the past 3 weeks contributing to presenting moods. Actively engaged patient in reassessing presenting depressive and  anxious symptoms experienced over the past 2 weeks, informing patient of abilities to discontinue screenings unless otherwise deemed necessary due to increased symptoms in the future. Utilized open-ended questions to support patient in greater exploration of thoughts, feelings, and perspectives surrounding presenting challenges and/or stressors, and further supporting patient in processing alternate perspectives, and ways in which she may prove to navigate presenting challenges.  Clinician reassessed severity of sxs, and presence of any safety concerns. Clinician provided support and empathy to patient during session.   Plan: Return again in 3 weeks.  Diagnosis: Adjustment disorder with mixed anxiety and depressed mood  Collaboration of Care: Other None deemed necessary at this time.  Patient/Guardian was advised Release of Information must be obtained prior to any record release in order to collaborate their care with an outside provider. Patient/Guardian was advised if they have not already done so to contact the registration department to sign all necessary forms in order for us  to release information regarding their care.   Consent: Patient/Guardian gives verbal consent for treatment and assignment of benefits for services provided during this visit. Patient/Guardian expressed understanding and agreed to proceed.   Patsi Boots, MSW, LCSW 04/06/2024,  11:21 AM

## 2024-04-27 ENCOUNTER — Ambulatory Visit (HOSPITAL_COMMUNITY): Admitting: Licensed Clinical Social Worker

## 2024-05-02 ENCOUNTER — Other Ambulatory Visit: Payer: Self-pay

## 2024-05-11 ENCOUNTER — Ambulatory Visit (HOSPITAL_COMMUNITY): Admitting: Licensed Clinical Social Worker

## 2024-05-11 DIAGNOSIS — F4323 Adjustment disorder with mixed anxiety and depressed mood: Secondary | ICD-10-CM

## 2024-05-11 NOTE — Progress Notes (Unsigned)
 THERAPIST PROGRESS NOTE   Session Date: 05/11/2024  Session Time: 1303 - 1357  Participation Level: Active  Behavioral Response: CasualAlertEuthymic  Type of Therapy: Individual Therapy  Treatment Goals addressed:  - Reduce frequency, intensity, and duration of depression symptoms so that daily functioning is improved  - Increase coping skills to manage depression and improve ability to perform daily activities  - Enedina will identify cognitive patterns and beliefs that support depression   ProgressTowards Goals: Progressing  Interventions: CBT, Motivational Interviewing, and Supportive  Summary: Tarita is a 63 year old female with past psych hx of Adjustment D/O w/ mixed Anxiety and Depressed mood, presenting for follow up therapy session for support in the management of depressive and anxious sxs.   Patient actively engaged in session, presenting in overall pleasant moods and congruent affect, actively engaging in introductory check-in, sharing of it's been okay, further detailing of having been working an increased amount over the past 5 weeks since previous session, detailing of seasonal work with [employer] coming to an end. ***    recent events and factors contributing to presenting moods. Pt further engaged in recounts of recent events of the past three weeks, sharing of having received mammogram and cardiology results, noting of no concerns, further processing how this proved to reduce stress and anxiousness related to ongoing medical issues. Pt actively engaged in reassessing presenting depressive and anxious sxs via PHQ-9 and GAD-7, engaging in processing of consistently low scores and abilities to d/c administering of screenings unless otherwise warranted in the future due to increased experienced sxs. Pt further engaged in processing of negative cognitive patterns that lead pt to focus on negativity and challenging events currently experienced with adult niece residing in her  home and challenging employees at workplace, further processing alternate approaches at managing and/or approaching stressors.  Patient responded well to interventions. Patient continues to meet criteria for Adjustment Disorder w/ mixed anxiety and depressed moods. Patient will continue to benefit from engagement in outpatient therapy due to being the least restrictive service to meet presenting needs.      04/06/2024   11:20 AM 02/24/2024   10:50 AM 02/03/2024    2:18 PM 01/14/2024   11:56 AM 12/31/2023   10:09 AM  Depression screen PHQ 2/9  Decreased Interest 0 1 0 1 2  Down, Depressed, Hopeless 1 1 1 1 1   PHQ - 2 Score 1 2 1 2 3   Altered sleeping 0 0 1 0 1  Tired, decreased energy 1 1 1 1 2   Change in appetite 0 0 0 0 2  Feeling bad or failure about yourself  0 0 0 0 0  Trouble concentrating 0 0 0 0 0  Moving slowly or fidgety/restless 0 0 0 0 0  Suicidal thoughts 0 0 0 0 0  PHQ-9 Score 2 3 3 3 8   Difficult doing work/chores Somewhat difficult Not difficult at all Somewhat difficult Somewhat difficult Somewhat difficult      04/06/2024   11:18 AM 02/24/2024   10:50 AM 02/03/2024    2:15 PM 01/14/2024   11:54 AM  GAD 7 : Generalized Anxiety Score  Nervous, Anxious, on Edge 1 1 1 1   Control/stop worrying 0 1 2 1   Worry too much - different things 0 1 2 1   Trouble relaxing 0 1 0 1  Restless 0 0 0 0  Easily annoyed or irritable 0 0 1 1  Afraid - awful might happen 0 0 0 0  Total GAD 7 Score 1  4 6 5   Anxiety Difficulty Somewhat difficult Somewhat difficult Somewhat difficult Somewhat difficult     Suicidal/Homicidal: No  Therapist Response: Clinician utilized CBT, supportive reflection, and motivational interviewing techniques in efforts to support patient in exploring presenting stressors.   Clinician actively greeted patient upon presenting for today's visit, assessing presenting moods and affect, further engaging patient in introductory check-in, and actively listening to  patient's recounts of events and factors of the past 3 weeks contributing to presenting moods. Actively engaged patient in reassessing presenting depressive and anxious symptoms experienced over the past 2 weeks, informing patient of abilities to discontinue screenings unless otherwise deemed necessary due to increased symptoms in the future. Utilized open-ended questions to support patient in greater exploration of thoughts, feelings, and perspectives surrounding presenting challenges and/or stressors, and further supporting patient in processing alternate perspectives, and ways in which she may prove to navigate presenting challenges.  Clinician reassessed severity of sxs, and presence of any safety concerns. Clinician provided support and empathy to patient during session.   Plan: Return again in 3 weeks.  Diagnosis: Adjustment disorder with mixed anxiety and depressed mood  Collaboration of Care: Other None deemed necessary at this time.  Patient/Guardian was advised Release of Information must be obtained prior to any record release in order to collaborate their care with an outside provider. Patient/Guardian was advised if they have not already done so to contact the registration department to sign all necessary forms in order for us  to release information regarding their care.   Consent: Patient/Guardian gives verbal consent for treatment and assignment of benefits for services provided during this visit. Patient/Guardian expressed understanding and agreed to proceed.   Lynwood JONETTA Maris, MSW, LCSW 05/11/2024,  1:04 PM

## 2024-05-24 ENCOUNTER — Telehealth: Payer: Self-pay | Admitting: Nurse Practitioner

## 2024-05-24 NOTE — Telephone Encounter (Signed)
 Called patient to confirm upcoming appointment 05/25/2024 at 9:10 am. Patient appointment has been successfully confirmed

## 2024-05-25 ENCOUNTER — Ambulatory Visit: Attending: Nurse Practitioner | Admitting: Nurse Practitioner

## 2024-05-25 ENCOUNTER — Encounter: Payer: Self-pay | Admitting: Nurse Practitioner

## 2024-05-25 VITALS — BP 107/78 | HR 72 | Resp 19 | Ht 62.0 in | Wt 153.0 lb

## 2024-05-25 DIAGNOSIS — G8929 Other chronic pain: Secondary | ICD-10-CM

## 2024-05-25 DIAGNOSIS — F17210 Nicotine dependence, cigarettes, uncomplicated: Secondary | ICD-10-CM

## 2024-05-25 DIAGNOSIS — E119 Type 2 diabetes mellitus without complications: Secondary | ICD-10-CM

## 2024-05-25 DIAGNOSIS — F17200 Nicotine dependence, unspecified, uncomplicated: Secondary | ICD-10-CM

## 2024-05-25 DIAGNOSIS — M25511 Pain in right shoulder: Secondary | ICD-10-CM | POA: Diagnosis not present

## 2024-05-25 DIAGNOSIS — Z7984 Long term (current) use of oral hypoglycemic drugs: Secondary | ICD-10-CM

## 2024-05-25 DIAGNOSIS — I10 Essential (primary) hypertension: Secondary | ICD-10-CM | POA: Diagnosis not present

## 2024-05-25 LAB — POCT GLYCOSYLATED HEMOGLOBIN (HGB A1C): HbA1c, POC (controlled diabetic range): 6.9 % (ref 0.0–7.0)

## 2024-05-25 NOTE — Progress Notes (Unsigned)
 Assessment & Plan:  Mallory Wall was seen today for diabetes and shoulder pain.  Diagnoses and all orders for this visit:  Essential hypertension -     CMP14+EGFR Continue all antihypertensives as prescribed.  Reminded to bring in blood pressure log for follow  up appointment.  RECOMMENDATIONS: DASH/Mediterranean Diets are healthier choices for HTN.    Diabetes mellitus treated with oral medication (HCC) -     POCT glycosylated hemoglobin (Hb A1C) -     Urine Albumin/Creatinine with ratio (send out) [LAB689] -     CMP14+EGFR  Chronic right shoulder pain -     DG Shoulder Right; Future  Tobacco dependence -     CT CHEST LUNG CA SCREEN LOW DOSE W/O CM; Future    Patient has been counseled on age-appropriate routine health concerns for screening and prevention. These are reviewed and up-to-date. Referrals have been placed accordingly. Immunizations are up-to-date or declined.    Subjective:   Chief Complaint  Patient presents with   Diabetes   Shoulder Pain    Right shoulder.    Mallory Wall 63 y.o. female presents to office today for follow up to HTN, DM and with concerns of right shoulder pain.   She has a past medical history of Allergy, Back pain, DM2, Gallstones, Headache, Hyperlipidemia, and Vaginal Pap smear, abnormal.     She has been experiencing left shoulder pain since June 2025, with radiation of pain down her left arm. The pain is exacerbated by activities such as lifting her arm while showering and is described as throbbing and severe, sometimes bringing tears to her eyes. There is a 'catch' in her arm affecting her ability to pick things up. She has been using an arthritis cream and patches, as well as BC arthritis powder, which provides some relief but does not completely take the pain away. She has a prescription for Voltaren  gel and meloxicam  but has not used meloxicam  due to concerns about side effects. She sometimes forgets to take the muscle relaxant   There is no numbness, burning, or tingling in her hand or wrist, but she reports stiffness and weakness in her arm. She also mentions hearing a popping sound in her shoulder when moving it.  She is actively working and notes that the heat at work is taking a toll on her, causing her to feel nauseated at times.  HTN Does not endorse any chest pain. Taking lisinopril  daily.  BP Readings from Last 3 Encounters:  05/25/24 107/78  02/24/24 139/83  11/27/23 122/84     Review of Systems  Constitutional:  Negative for fever, malaise/fatigue and weight loss.  HENT: Negative.  Negative for nosebleeds.   Eyes: Negative.  Negative for blurred vision, double vision and photophobia.  Respiratory: Negative.  Negative for cough and shortness of breath.   Cardiovascular: Negative.  Negative for chest pain, palpitations and leg swelling.  Gastrointestinal: Negative.  Negative for heartburn, nausea and vomiting.  Musculoskeletal:  Positive for joint pain. Negative for myalgias.  Neurological: Negative.  Negative for dizziness, focal weakness, seizures and headaches.  Psychiatric/Behavioral: Negative.  Negative for suicidal ideas.     Past Medical History:  Diagnosis Date   Allergy    Back pain    Diabetes mellitus without complication (HCC)    Gallstones    Headache(784.0)    Hyperlipidemia    Vaginal Pap smear, abnormal     Past Surgical History:  Procedure Laterality Date   CHOLECYSTECTOMY N/A 01/13/2018   Procedure: LAPAROSCOPIC  CHOLECYSTECTOMY;  Surgeon: Vernetta Berg, MD;  Location: [facility];  Service: General;  Laterality: N/A;   NO PAST SURGERIES      Family History  Problem Relation Age of Onset   CAD Mother    Hypertension Mother    Diabetes type II Sister    Lung disease Sister    Colon cancer Neg Hx    Esophageal cancer Neg Hx    Pancreatic cancer Neg Hx    Rectal cancer Neg Hx    Stomach cancer Neg Hx    Breast cancer Neg Hx     Social History  Reviewed with no changes to be made today.   Outpatient Medications Prior to Visit  Medication Sig Dispense Refill   Blood Glucose Monitoring Suppl (ONETOUCH VERIO) w/Device KIT Use to check blood sugar once daily. 1 kit 0   diclofenac  Sodium (VOLTAREN ) 1 % GEL Apply 2 g topically 4 (four) times daily. 200 g 1   famotidine  (PEPCID ) 20 MG tablet Take 1 tablet (20 mg total) by mouth daily. 90 tablet 2   glucose blood (ONETOUCH VERIO) test strip Use to check blood sugar once daily. 100 each 2   latanoprost  (XALATAN ) 0.005 % ophthalmic solution Place 1 drop into both eyes at bedtime. 5 mL 3   lisinopril  (ZESTRIL ) 20 MG tablet Take 1 tablet (20 mg total) by mouth daily. 90 tablet 3   meloxicam  (MOBIC ) 7.5 MG tablet Take 1 tablet (7.5 mg total) by mouth daily. 90 tablet 1   metFORMIN  (GLUCOPHAGE ) 500 MG tablet Take 1 tablet (500 mg total) by mouth 2 (two) times daily with a meal. 180 tablet 1   methocarbamol  (ROBAXIN ) 500 MG tablet Take 1 tablet (500 mg total) by mouth 4 (four) times daily. 90 tablet 6   OneTouch Delica Lancets 33G MISC Use to check blood sugar once daily. 100 each 2   rosuvastatin  (CRESTOR ) 5 MG tablet Take 1 tablet (5 mg total) by mouth daily. 90 tablet 2   topiramate  (TOPAMAX ) 25 MG tablet Take 1-2 tablets (25-50 mg total) by mouth 2 (two) times daily. 90 tablet 1   No facility-administered medications prior to visit.    No Known Allergies     Objective:    BP 107/78 (BP Location: Left Arm, Patient Position: Sitting, Cuff Size: Normal)   Pulse 72   Resp 19   Ht 5' 2 (1.575 m)   Wt 153 lb (69.4 kg)   SpO2 100%   BMI 27.98 kg/m  Wt Readings from Last 3 Encounters:  05/25/24 153 lb (69.4 kg)  02/24/24 157 lb 9.6 oz (71.5 kg)  11/27/23 156 lb 12.8 oz (71.1 kg)    Physical Exam Vitals and nursing note reviewed.  Constitutional:      Appearance: She is well-developed.  HENT:     Head: Normocephalic and atraumatic.  Cardiovascular:     Rate and Rhythm: Normal  rate and regular rhythm.     Heart sounds: Normal heart sounds. No murmur heard.    No friction rub. No gallop.  Pulmonary:     Effort: Pulmonary effort is normal. No tachypnea or respiratory distress.     Breath sounds: Normal breath sounds. No decreased breath sounds, wheezing, rhonchi or rales.  Chest:     Chest wall: No tenderness.  Musculoskeletal:        General: Normal range of motion.     Cervical back: Normal range of motion.  Skin:    General: Skin is  warm and dry.  Neurological:     Mental Status: She is alert and oriented to person, place, and time.     Coordination: Coordination normal.  Psychiatric:        Behavior: Behavior normal. Behavior is cooperative.        Thought Content: Thought content normal.        Judgment: Judgment normal.          Patient has been counseled extensively about nutrition and exercise as well as the importance of adherence with medications and regular follow-up. The patient was given clear instructions to go to ER or return to medical center if symptoms don't improve, worsen or new problems develop. The patient verbalized understanding.   Follow-up: Return in about 3 months (around 08/25/2024).   Haze LELON Servant, FNP-BC [facility] [place], [place] [phone]   05/25/2024, 11:13 PM

## 2024-05-26 ENCOUNTER — Ambulatory Visit: Payer: Self-pay | Admitting: Nurse Practitioner

## 2024-05-26 ENCOUNTER — Encounter: Payer: Self-pay | Admitting: Nurse Practitioner

## 2024-05-26 LAB — CMP14+EGFR
ALT: 11 IU/L (ref 0–32)
AST: 12 IU/L (ref 0–40)
Albumin: 4.6 g/dL (ref 3.9–4.9)
Alkaline Phosphatase: 90 IU/L (ref 44–121)
BUN/Creatinine Ratio: 28 (ref 12–28)
BUN: 19 mg/dL (ref 8–27)
Bilirubin Total: <0.2 mg/dL (ref 0.0–1.2)
CO2: 22 mmol/L (ref 20–29)
Calcium: 10.1 mg/dL (ref 8.7–10.3)
Chloride: 102 mmol/L (ref 96–106)
Creatinine, Ser: 0.69 mg/dL (ref 0.57–1.00)
Globulin, Total: 3.1 g/dL (ref 1.5–4.5)
Glucose: 123 mg/dL — ABNORMAL HIGH (ref 70–99)
Potassium: 4.4 mmol/L (ref 3.5–5.2)
Sodium: 142 mmol/L (ref 134–144)
Total Protein: 7.7 g/dL (ref 6.0–8.5)
eGFR: 98 mL/min/1.73 (ref >59)

## 2024-05-26 LAB — MICROALBUMIN / CREATININE URINE RATIO
Creatinine, Urine: 256.4 mg/dL
Microalb/Creat Ratio: 30 mg/g{creat} — ABNORMAL HIGH (ref 0–29)
Microalbumin, Urine: 76.4 ug/mL

## 2024-06-03 ENCOUNTER — Ambulatory Visit
Admission: RE | Admit: 2024-06-03 | Discharge: 2024-06-03 | Disposition: A | Discharge status: Home / Self Care | Source: Ambulatory Visit | Attending: Nurse Practitioner | Admitting: Nurse Practitioner

## 2024-06-03 DIAGNOSIS — M25512 Pain in left shoulder: Secondary | ICD-10-CM

## 2024-06-03 DIAGNOSIS — G8929 Other chronic pain: Secondary | ICD-10-CM

## 2024-06-03 DIAGNOSIS — F17200 Nicotine dependence, unspecified, uncomplicated: Secondary | ICD-10-CM

## 2024-06-06 ENCOUNTER — Other Ambulatory Visit: Payer: Self-pay

## 2024-06-16 ENCOUNTER — Ambulatory Visit (HOSPITAL_COMMUNITY): Admitting: Licensed Clinical Social Worker

## 2024-06-16 DIAGNOSIS — F4323 Adjustment disorder with mixed anxiety and depressed mood: Secondary | ICD-10-CM | POA: Diagnosis not present

## 2024-06-16 NOTE — Progress Notes (Unsigned)
 THERAPIST PROGRESS NOTE   Session Date: 06/16/2024  Session Time: 1013 - 1105  Participation Level: Active  Behavioral Response: CasualAlertEuthymic  Type of Therapy: Individual Therapy  Treatment Goals addressed:   Progressing (3) LTG: Reduce frequency, intensity, and duration of depression symptoms so that daily functioning is improved (OP Depression) LTG: Increase coping skills to manage depression and improve ability to perform daily activities (OP Depression) STG: Janina will identify cognitive patterns and beliefs that support depression (OP Depression)  Completed/Met (2) LTG: Yalexa will score less than 5 on the Generalized Anxiety Disorder 7 Scale (GAD-7) (Anxiety) STG: Report a decrease in anxiety symptoms as evidenced by an overall reduction in anxiety score by a minimum of 25% on the Generalized Anxiety Disorder Scale (GAD-7) (Anxiety)  ProgressTowards Goals: Progressing  Interventions: CBT, Motivational Interviewing, and Supportive  Summary: Amberli is a 63 year old female with past psych hx of Adjustment D/O w/ mixed Anxiety and Depressed mood, presenting for follow up therapy session for support in the management of depressive and anxious sxs.   Patient actively engaged in session, presenting in overall pleasant moods and congruent affect, actively engaging in introductory check-in, sharing of It's been, further detailing of feeling tired and frustrated with things at home proving to get to her and stress her out. Pt shared factors specific to ***         it's been okay, further detailing of having been working an increased amount over the past 5 weeks since previous session, detailing of seasonal work with [employer] coming to an end.  Patient for the detailed ongoing stress surrounding ranked, with property manages mandating the patient signed a lease rather than prior arrangements of month-to-month parental, with rent having increased, and adult niece continuing  to reside in the patient's home paying very little to support the patient in management of household financial expenses.  Actively engaged in processing individual thoughts, feelings, and perspectives surrounding presenting stressors and challenges experienced with the niece residing in home, and the patient's comfort level in revisiting financial obligations with her niece or the possibility of her vacating the apartment.  Patient responded well to interventions. Patient continues to meet criteria for Adjustment Disorder w/ mixed anxiety and depressed moods. Patient will continue to benefit from engagement in outpatient therapy due to being the least restrictive service to meet presenting needs.      06/16/2024   10:42 AM 04/06/2024   11:20 AM 02/24/2024   10:50 AM 02/03/2024    2:18 PM 01/14/2024   11:56 AM  Depression screen PHQ 2/9  Decreased Interest 1 0 1 0 1  Down, Depressed, Hopeless 3 1 1 1 1   PHQ - 2 Score 4 1 2 1 2   Altered sleeping 0 0 0 1 0  Tired, decreased energy 1 1 1 1 1   Change in appetite 0 0 0 0 0  Feeling bad or failure about yourself  1 0 0 0 0  Trouble concentrating 1 0 0 0 0  Moving slowly or fidgety/restless 0 0 0 0 0  Suicidal thoughts 1 0 0 0 0  PHQ-9 Score 8 2 3 3 3   Difficult doing work/chores Not difficult at all Somewhat difficult Not difficult at all Somewhat difficult Somewhat difficult      06/16/2024   10:46 AM 04/06/2024   11:18 AM 02/24/2024   10:50 AM 02/03/2024    2:15 PM  GAD 7 : Generalized Anxiety Score  Nervous, Anxious, on Edge 1 1 1 1   Control/stop worrying  1 0 1 2  Worry too much - different things 1 0 1 2  Trouble relaxing 0 0 1 0  Restless 0 0 0 0  Easily annoyed or irritable 1 0 0 1  Afraid - awful might happen 1 0 0 0  Total GAD 7 Score 5 1 4 6   Anxiety Difficulty Not difficult at all Somewhat difficult Somewhat difficult Somewhat difficult     Suicidal/Homicidal: No  Therapist Response: Clinician utilized CBT, supportive  reflection, and motivational interviewing techniques in efforts to support patient in exploring presenting stressors.   Clinician actively greeted patient upon presenting for today's visit, assessing presenting moods and affect, further engaging patient in introductory check-in. Utilized open ended questions to evoke pt's recounts of events and factors contributing to presenting moods. Provided support and validation of identified thoughts and expressed feelings. Supported pt in processing thoughts through utilization of socratic questioning to prompt greater critical exploration of individual thoughts, supporting pt in processing rationale, perspectives, and readiness to approach stressors within relationship with niece.   Clinician reassessed severity of sxs, and presence of any safety concerns. Clinician provided support and empathy to patient during session.   Plan: Return again in 4 weeks.  Diagnosis: Adjustment disorder with mixed anxiety and depressed mood  Collaboration of Care: Other None deemed necessary at this time.  Patient/Guardian was advised Release of Information must be obtained prior to any record release in order to collaborate their care with an outside provider. Patient/Guardian was advised if they have not already done so to contact the registration department to sign all necessary forms in order for us  to release information regarding their care.   Consent: Patient/Guardian gives verbal consent for treatment and assignment of benefits for services provided during this visit. Patient/Guardian expressed understanding and agreed to proceed.   Lynwood JONETTA Maris, MSW, LCSW 06/16/2024,  10:47 AM

## 2024-07-07 ENCOUNTER — Other Ambulatory Visit: Payer: Self-pay

## 2024-07-08 ENCOUNTER — Telehealth: Payer: Self-pay | Admitting: Nurse Practitioner

## 2024-07-08 NOTE — Telephone Encounter (Signed)
 Contacted pt left vm to return office call to resch appt to Dr.Newlin

## 2024-07-14 ENCOUNTER — Ambulatory Visit (HOSPITAL_COMMUNITY): Admitting: Licensed Clinical Social Worker

## 2024-07-14 DIAGNOSIS — F4323 Adjustment disorder with mixed anxiety and depressed mood: Secondary | ICD-10-CM

## 2024-07-14 NOTE — Progress Notes (Signed)
 THERAPIST PROGRESS NOTE   Session Date: 07/14/2024  Session Time: 1015 - 1056  Participation Level: Active  Behavioral Response: CasualAlertAnxious and Depressed  Type of Therapy: Individual Therapy  Treatment Goals addressed:   Progressing (3) LTG: Reduce frequency, intensity, and duration of depression symptoms so that daily functioning is improved (OP Depression) LTG: Increase coping skills to manage depression and improve ability to perform daily activities (OP Depression) STG: [name] will identify cognitive patterns and beliefs that support depression (OP Depression)  Completed/Met (2) LTG: [name] will score less than 5 on the Generalized Anxiety Disorder 7 Scale (GAD-7) (Anxiety) STG: Report a decrease in anxiety symptoms as evidenced by an overall reduction in anxiety score by a minimum of 25% on the Generalized Anxiety Disorder Scale (GAD-7) (Anxiety)  ProgressTowards Goals: Not Progressing  Interventions: CBT, Motivational Interviewing, Solution Focused, and Supportive  Summary: Mallory Wall is a 63 year old female with past psych hx of Adjustment D/O w/ mixed Anxiety and Depressed mood, presenting for follow up therapy session for support in the management of depressive and anxious sxs.   Patient actively engaged in session, presenting in overall depressed moods and congruent affect, actively engaging in introductory check-in, sharing of It's been going, further detailing of ongoing stressors experienced over the past month. Pt actively engaged in processing transitions in seasonal work, proving to impact finances, sharing of minor increase in hours at second job and not explored potential alternate employment at this time, further considering early retirement. Pt detailed pressing financial stress resulting in rent being late and threats of power being cut off, further expressing frustrations relating to adult niece and her child continuing to reside in pt's home paying a total of  $200/mo, which pt feels to be insufficient. Processed pt's action steps in exploring a resolution, sharing of hoping that niece will come to her and let her know they will be moving, further reflecting on having hoped this to be the case over the past two years. Actively processed how hope has proven to impact pt and resulting in becoming complacent in addressing own needs.   Patient responded well to interventions. Patient continues to meet criteria for Adjustment Disorder w/ mixed anxiety and depressed moods. Patient will continue to benefit from engagement in outpatient therapy due to being the least restrictive service to meet presenting needs.      06/16/2024   10:42 AM 04/06/2024   11:20 AM 02/24/2024   10:50 AM 02/03/2024    2:18 PM 01/14/2024   11:56 AM  Depression screen PHQ 2/9  Decreased Interest 1 0 1 0 1  Down, Depressed, Hopeless 3 1 1 1 1   PHQ - 2 Score 4 1 2 1 2   Altered sleeping 0 0 0 1 0  Tired, decreased energy 1 1 1 1 1   Change in appetite 0 0 0 0 0  Feeling bad or failure about yourself  1 0 0 0 0  Trouble concentrating 1 0 0 0 0  Moving slowly or fidgety/restless 0 0 0 0 0  Suicidal thoughts 1 0 0 0 0  PHQ-9 Score 8 2 3 3 3   Difficult doing work/chores Not difficult at all Somewhat difficult Not difficult at all Somewhat difficult Somewhat difficult      06/16/2024   10:46 AM 04/06/2024   11:18 AM 02/24/2024   10:50 AM 02/03/2024    2:15 PM  GAD 7 : Generalized Anxiety Score  Nervous, Anxious, on Edge 1 1 1 1   Control/stop worrying 1 0 1 2  Worry too much - different things 1 0 1 2  Trouble relaxing 0 0 1 0  Restless 0 0 0 0  Easily annoyed or irritable 1 0 0 1  Afraid - awful might happen 1 0 0 0  Total GAD 7 Score 5 1 4 6   Anxiety Difficulty Not difficult at all Somewhat difficult Somewhat difficult Somewhat difficult     Suicidal/Homicidal: No  Therapist Response: Clinician utilized CBT, supportive reflection, and motivational interviewing techniques in  efforts to support patient in exploring presenting stressors.   Clinician openly greeted patient upon presenting for today's visit, assessing presenting moods and affect, engaging patient in check-in, utilizing open ended questions to elicit pt's recounts of events of the past month, newly developed and ongoing stressors, individual means of navigating challenges, and factors contributing to presenting moods. Utilized active listening techniques to support pt's recounts of events, providing support and validation of identified thoughts and expressed feelings. Utilized socratic questions to prompt greater critical processing of thoughts and perspectives in aiding pt in challenging irrational thoughts and determining actionable behaviors that would support pt's desired outcomes, as well as supporting in challenging or irrational thoughts and identifying more rational perspectives. Clinician reassessed severity of sxs, and presence of any safety concerns. Pt proves to have made no progression towards tx goals in recent weeks.  Plan: Return again in 4 weeks.  Diagnosis: Adjustment disorder with mixed anxiety and depressed mood  Collaboration of Care: Other None deemed necessary at this time.  Patient/Guardian was advised Release of Information must be obtained prior to any record release in order to collaborate their care with an outside provider. Patient/Guardian was advised if they have not already done so to contact the registration department to sign all necessary forms in order for us  to release information regarding their care.   Consent: Patient/Guardian gives verbal consent for treatment and assignment of benefits for services provided during this visit. Patient/Guardian expressed understanding and agreed to proceed.   Lynwood JONETTA Maris, MSW, LCSW 07/14/2024,  10:59 AM

## 2024-08-02 ENCOUNTER — Other Ambulatory Visit: Payer: Self-pay

## 2024-08-03 ENCOUNTER — Other Ambulatory Visit: Payer: Self-pay

## 2024-08-05 ENCOUNTER — Other Ambulatory Visit (HOSPITAL_COMMUNITY): Payer: Self-pay

## 2024-08-05 MED ORDER — LATANOPROST 0.005 % OP SOLN
1.0000 [drp] | Freq: Every day | OPHTHALMIC | 3 refills | Status: AC
  Filled 2024-08-05: qty 7.5, 75d supply, fill #0
  Filled 2024-11-29: qty 7.5, 84d supply, fill #0

## 2024-08-11 ENCOUNTER — Ambulatory Visit (HOSPITAL_COMMUNITY): Admitting: Licensed Clinical Social Worker

## 2024-08-11 ENCOUNTER — Encounter (HOSPITAL_COMMUNITY): Payer: Self-pay

## 2024-08-11 DIAGNOSIS — F341 Dysthymic disorder: Secondary | ICD-10-CM

## 2024-08-11 NOTE — Progress Notes (Signed)
 THERAPIST PROGRESS NOTE   Session Date: 08/11/2024  Session Time: 1005 - 1105  Participation Level: Active  Behavioral Response: CasualAlertAnxious and Depressed  Type of Therapy: Individual Therapy  Treatment Goals addressed:   Progressing (2) LTG: Reduce frequency, intensity, and duration of depression symptoms so that daily functioning is improved (OP Depression) LTG: Increase coping skills to manage depression and improve ability to perform daily activities (OP Depression)  Not Progressing (1) STG: Mallory Wall will identify cognitive patterns and beliefs that support depression (OP Depression)  Completed/Met (2) LTG: Mallory Wall will score less than 5 on the Generalized Anxiety Disorder 7 Scale (GAD-7) (Anxiety) STG: Report a decrease in anxiety symptoms as evidenced by an overall reduction in anxiety score by a minimum of 25% on the Generalized Anxiety Disorder Scale (GAD-7) (Anxiety)  ProgressTowards Goals: Not Progressing  Interventions: CBT, Motivational Interviewing, Solution Focused, and Supportive  Summary: Mallory Wall is a 63 year old female with psych hx of PDD, presenting for follow up therapy session for support in the management of depressive and anxious sxs.   Patient actively engaged in session, presenting in overall depressed moods and congruent affect, actively engaging in introductory check-in, sharing of Doing okay, further detailing of not doing bad, but not doing great, noting of having been given more hours at the [employer] over recent weeks, proving to help manage financial stress, however still feeling unable to get ahead with finances. Pt shared of ongoing thoughts surrounding potentially retiring early, expressing intent to explore potential available options for early retirement considering having worked for almost the past 50 years and feeling increasingly tired at this stage and historically having expectation to be relaxing and enjoying day-to-day life at this point in  her life. Pt shared added stress surrounding the nature of relationship with romantic partner, detailing of him having expectations of pt to adhere and/or align with historical gender roles, however not proving to support pt in the manner in which she would expect a husband or life partner to support her, further processing lack of communication within relationship and inconsistencies in interactions and time spent together. Engaged in processing importance of utilizing 'I Statements' in communicating thoughts, feelings, and needs in order to increase individual efforts in navigating challenges surrounding relationship.     08/11/2024   10:51 AM 06/16/2024   10:42 AM 04/06/2024   11:20 AM 02/24/2024   10:50 AM 02/03/2024    2:18 PM  Depression screen PHQ 2/9  Decreased Interest 0 1 0 1 0  Down, Depressed, Hopeless 1 3 1 1 1   PHQ - 2 Score 1 4 1 2 1   Altered sleeping 3 0 0 0 1  Tired, decreased energy 0 1 1 1 1   Change in appetite 1 0 0 0 0  Feeling bad or failure about yourself  1 1 0 0 0  Trouble concentrating 1 1 0 0 0  Moving slowly or fidgety/restless 0 0 0 0 0  Suicidal thoughts 1 1 0 0 0  PHQ-9 Score 8 8 2 3 3   Difficult doing work/chores Not difficult at all Not difficult at all Somewhat difficult Not difficult at all Somewhat difficult      08/11/2024   10:56 AM 06/16/2024   10:46 AM 04/06/2024   11:18 AM 02/24/2024   10:50 AM  GAD 7 : Generalized Anxiety Score  Nervous, Anxious, on Edge 1 1 1 1   Control/stop worrying 3 1 0 1  Worry too much - different things 3 1 0 1  Trouble relaxing 1  0 0 1  Restless 0 0 0 0  Easily annoyed or irritable 1 1 0 0  Afraid - awful might happen 1 1 0 0  Total GAD 7 Score 10 5 1 4   Anxiety Difficulty Not difficult at all Not difficult at all Somewhat difficult Somewhat difficult     Suicidal/Homicidal: Recent passive; No intent. No current SI, HI, AVH.  Therapist Response:  Clinician openly greeted patient upon presenting for today's visit,  assessing presenting moods and affect, engaging patient in check-in, exploring morning events and presenting moods. Utilized open ended questions in eliciting recounts of events of the past month, exploring newly presenting stressors, ongoing stressors, individual efforts at managing challenges, and implications on presenting moods.  Utilized active listening techniques in supporting pt's recounts of events, providing support and validation of thoughts and feelings, further aiding in challenging irrational thoughts and perspectives. Clinician utilized CBT, supportive reflection, MI, and Psycho-Ed techniques to support patient in navigating challenges. Clinician reassessed severity of sxs, and presence of safety concerns.  []  Cognitive Challenging [x]  Cognitive Refocusing [x]  Cognitive Reframing  [x]  Communication Skills []  Compliance Issues []  DBT []  Exploration of Coping Patterns []  Exploration of Emotions [x]  Exploration of Relationship Patterns []  Guided Imagery []  Interactive Feedback [x]  Interpersonal Resolutions []  Mindfulness Training []  Preventative Services [x]  Psycho-Education  []  Relaxation/Deep Breathing [x]  Review of Treatment Plan/Progress []  Role-Play/Behavioral Rehearsal  [x]  Structured Problem Solving [x]  Supportive Reflection []  Symptom Management  []  Other   Patient responded well to interventions. Patient continues to meet criteria for PDD. Patient will continue to benefit from engagement in outpatient therapy due to being the least restrictive service to meet presenting needs. Pt proves to have made minimal progression towards tx goals in recent weeks.  Plan: Return again in 3 weeks.  Diagnosis: Persistent depressive disorder, mild  Collaboration of Care: Other None deemed necessary at this time.  Patient/Guardian was advised Release of Information must be obtained prior to any record release in order to collaborate their care with an outside provider. Patient/Guardian was  advised if they have not already done so to contact the registration department to sign all necessary forms in order for us  to release information regarding their care.   Consent: Patient/Guardian gives verbal consent for treatment and assignment of benefits for services provided during this visit. Patient/Guardian expressed understanding and agreed to proceed.   Mallory Wall, MSW, LCSW 08/11/2024,  11:08 AM

## 2024-08-26 ENCOUNTER — Other Ambulatory Visit: Payer: Self-pay

## 2024-08-26 ENCOUNTER — Ambulatory Visit: Admitting: Nurse Practitioner

## 2024-08-26 ENCOUNTER — Ambulatory Visit: Attending: Family Medicine | Admitting: Family Medicine

## 2024-08-26 ENCOUNTER — Encounter: Payer: Self-pay | Admitting: Family Medicine

## 2024-08-26 ENCOUNTER — Other Ambulatory Visit (HOSPITAL_COMMUNITY)
Admission: RE | Admit: 2024-08-26 | Discharge: 2024-08-26 | Disposition: A | Discharge status: Home / Self Care | Source: Ambulatory Visit | Attending: Family Medicine | Admitting: Family Medicine

## 2024-08-26 VITALS — BP 147/91 | HR 88 | Temp 98.4°F | Ht 62.0 in | Wt 153.8 lb

## 2024-08-26 DIAGNOSIS — Z79899 Other long term (current) drug therapy: Secondary | ICD-10-CM

## 2024-08-26 DIAGNOSIS — R350 Frequency of micturition: Secondary | ICD-10-CM

## 2024-08-26 DIAGNOSIS — I10 Essential (primary) hypertension: Secondary | ICD-10-CM | POA: Diagnosis not present

## 2024-08-26 DIAGNOSIS — F17210 Nicotine dependence, cigarettes, uncomplicated: Secondary | ICD-10-CM

## 2024-08-26 DIAGNOSIS — Z113 Encounter for screening for infections with a predominantly sexual mode of transmission: Secondary | ICD-10-CM | POA: Insufficient documentation

## 2024-08-26 DIAGNOSIS — Z2821 Immunization not carried out because of patient refusal: Secondary | ICD-10-CM

## 2024-08-26 DIAGNOSIS — E119 Type 2 diabetes mellitus without complications: Secondary | ICD-10-CM

## 2024-08-26 DIAGNOSIS — E1169 Type 2 diabetes mellitus with other specified complication: Secondary | ICD-10-CM

## 2024-08-26 DIAGNOSIS — Z7984 Long term (current) use of oral hypoglycemic drugs: Secondary | ICD-10-CM

## 2024-08-26 DIAGNOSIS — E7800 Pure hypercholesterolemia, unspecified: Secondary | ICD-10-CM

## 2024-08-26 DIAGNOSIS — G43009 Migraine without aura, not intractable, without status migrainosus: Secondary | ICD-10-CM

## 2024-08-26 DIAGNOSIS — Z7902 Long term (current) use of antithrombotics/antiplatelets: Secondary | ICD-10-CM

## 2024-08-26 LAB — POCT URINALYSIS DIP (CLINITEK)
Blood, UA: NEGATIVE
Glucose, UA: NEGATIVE mg/dL
Ketones, POC UA: NEGATIVE mg/dL
Leukocytes, UA: NEGATIVE
Nitrite, UA: NEGATIVE
POC PROTEIN,UA: 100 — AB
Spec Grav, UA: >=1.03 — AB (ref 1.010–1.025)
Urobilinogen, UA: 0.2 U/dL
pH, UA: 5.5 (ref 5.0–8.0)

## 2024-08-26 LAB — POCT GLYCOSYLATED HEMOGLOBIN (HGB A1C): HbA1c, POC (controlled diabetic range): 6.4 % (ref 0.0–7.0)

## 2024-08-26 MED ORDER — ROSUVASTATIN CALCIUM 5 MG PO TABS
5.0000 mg | ORAL_TABLET | Freq: Every day | ORAL | 2 refills | Status: AC
  Filled 2024-08-26: qty 30, 30d supply, fill #0
  Filled 2024-10-31: qty 30, 30d supply, fill #1
  Filled 2024-11-29: qty 30, 30d supply, fill #2

## 2024-08-26 MED ORDER — METFORMIN HCL 500 MG PO TABS
500.0000 mg | ORAL_TABLET | Freq: Two times a day (BID) | ORAL | 1 refills | Status: AC
  Filled 2024-08-26: qty 60, 30d supply, fill #0
  Filled 2024-10-05: qty 60, 30d supply, fill #1
  Filled 2024-10-31: qty 60, 30d supply, fill #2
  Filled 2024-11-29: qty 60, 30d supply, fill #3

## 2024-08-26 NOTE — Patient Instructions (Signed)
 VISIT SUMMARY:  Today, we discussed your diabetes management, blood pressure, cholesterol levels, and frequent urination. Your diabetes is well-controlled with an A1c of 6.4, and we addressed your medication needs. We also talked about your blood pressure, cholesterol, and hydration. Additionally, we reviewed your general health maintenance and vaccine recommendations.  YOUR PLAN:  -TYPE 2 DIABETES MELLITUS WITH PROTEINURIA: Your diabetes is well-controlled with an A1c of 6.4. 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 We will order kidney function tests, refill your metformin  prescription, and perform your annual foot exam.  -HYPERTENSION: Your blood pressure was slightly elevated today. Hypertension means high blood pressure, which can be managed with medication. We will recheck your blood pressure during this visit and refill your lisinopril  prescription.  -HYPERLIPIDEMIA: Your cholesterol levels were normal in January. Hyperlipidemia means having high levels of fats in your blood. We will order a cholesterol test and refill your rosuvastatin  prescription.  -URINARY FREQUENCY: You have been experiencing frequent urination without signs of infection. This could be due to concentrated urine, which means you may need to drink more water. We will order a vaginal swab to check for any bacterial or yeast infections and encourage you to increase your water intake.  -MIGRAINE: Your migraines are being managed with Topamax , and there are no issues with your medication refills.  -GENERAL HEALTH MAINTENANCE: You are due for a pneumonia vaccine, which is recommended for individuals with diabetes. You declined the vaccine today.  INSTRUCTIONS:  Please follow up with the kidney function tests and cholesterol test as ordered. Make sure to increase your water intake to help with urinary frequency. We will recheck your blood pressure  during this visit. Remember to schedule your annual foot exam and consider getting the pneumonia vaccine at your next visit.

## 2024-08-26 NOTE — Progress Notes (Signed)
 Subjective:  Patient ID: Mallory Wall, female    DOB: 1961/10/15  Age: 63 y.o. MRN: [id number]  CC: Medical Management of Chronic Issues (Frequent urination)     Discussed the use of AI scribe software for clinical note transcription with the patient, who gave verbal consent to proceed.  History of Present Illness Mallory Wall is a 63 year old female patient of Mallory Wall with type 2 diabetes mellitus, hypertension, hyperlipidemia who presents with concerns about frequent urination.  Her A1c has improved to 6.4. She is on metformin , but her supply is low. She experiences frequent urination without dysuria, or back pain. Occasional back pain and left lower quadrant discomfort are present but not constant. A urine specimen shows slight proteinuria and elevated specific gravity. Current medications include lisinopril , metformin , rosuvastatin , and Topamax .   She is adherent with her statin and her antihypertensive.    Past Medical History:  Diagnosis Date   Allergy    Back pain    Diabetes mellitus without complication (HCC)    Gallstones    Headache(784.0)    Hyperlipidemia    Vaginal Pap smear, abnormal     Past Surgical History:  Procedure Laterality Date   CHOLECYSTECTOMY N/A 01/13/2018   Procedure: LAPAROSCOPIC CHOLECYSTECTOMY;  Surgeon: Vernetta Berg, MD;  [facility]:  [facility];  Service: General;  Laterality: N/A;   NO PAST SURGERIES      Family History  Problem Relation Age of Onset   CAD Mother    Hypertension Mother    Diabetes type II Sister    Lung disease Sister    Colon cancer Neg Hx    Esophageal cancer Neg Hx    Pancreatic cancer Neg Hx    Rectal cancer Neg Hx    Stomach cancer Neg Hx    Breast cancer Neg Hx     Social History   Socioeconomic History   Marital status: Single    Spouse name: Not on file   Number of children: 0   Years of education: Not on file   Highest education level: High school graduate   Occupational History   Not on file  Tobacco Use   Smoking status: Every Day    Current packs/day: 1.00    Average packs/day: 1 pack/day for 49.0 years (49.0 ttl pk-yrs)    Types: Cigarettes   Smokeless tobacco: Never  Vaping Use   Vaping status: Never Used  Substance and Sexual Activity   Alcohol use: Not Currently   Drug use: No   Sexual activity: Not Currently    Birth control/protection: Post-menopausal  Other Topics Concern   Not on file  Social History Narrative   Works at [employer]   Social Drivers of Corporate Investment Banker Strain: Low Risk  (08/13/2023)   Overall Financial Resource Strain (CARDIA)    Difficulty of Paying Living Expenses: Not very hard  Food Insecurity: No Food Insecurity (08/13/2023)   Hunger Vital Sign    Worried About Running Out of Food in the Last Year: Never true    Ran Out of Food in the Last Year: Never true  Transportation Needs: No Transportation Needs (08/13/2023)   PRAPARE - Administrator, Civil Service (Medical): No    Lack of Transportation (Non-Medical): No  Physical Activity: Insufficiently Active (08/13/2023)   Exercise Vital Sign    Days of Exercise per Week: 4 days    Minutes of Exercise per Session: 30 min  Stress: No Stress Concern Present (  08/13/2023)   [employer] - Occupational Stress Questionnaire    Feeling of Stress : Only a little  Social Connections: Socially Isolated (08/13/2023)   Social Connection and Isolation Panel    Frequency of Communication with Friends and Family: Three times a week    Frequency of Social Gatherings with Friends and Family: Three times a week    Attends Religious Services: Never    Active Member of Clubs or Organizations: No    Attends Banker Meetings: Never    Marital Status: Never married    No Known Allergies  Outpatient Medications Prior to Visit  Medication Sig Dispense Refill   Blood Glucose Monitoring Suppl  (ONETOUCH VERIO) w/Device KIT Use to check blood sugar once daily. 1 kit 0   diclofenac  Sodium (VOLTAREN ) 1 % GEL Apply 2 g topically 4 (four) times daily. 200 g 1   famotidine  (PEPCID ) 20 MG tablet Take 1 tablet (20 mg total) by mouth daily. 90 tablet 2   glucose blood (ONETOUCH VERIO) test strip Use to check blood sugar once daily. 100 each 2   latanoprost  (XALATAN ) 0.005 % ophthalmic solution Place 1 drop into both eyes at bedtime. 5 mL 3   latanoprost  (XALATAN ) 0.005 % ophthalmic solution INSTILL 1 DROP INTO BOTH EYES AT BEDTIME 7.5 mL 3   lisinopril  (ZESTRIL ) 20 MG tablet Take 1 tablet (20 mg total) by mouth daily. 90 tablet 3   meloxicam  (MOBIC ) 7.5 MG tablet Take 1 tablet (7.5 mg total) by mouth daily. 90 tablet 1   methocarbamol  (ROBAXIN ) 500 MG tablet Take 1 tablet (500 mg total) by mouth 4 (four) times daily. 90 tablet 6   OneTouch Delica Lancets 33G MISC Use to check blood sugar once daily. 100 each 2   topiramate  (TOPAMAX ) 25 MG tablet Take 1-2 tablets (25-50 mg total) by mouth 2 (two) times daily. 90 tablet 1   metFORMIN  (GLUCOPHAGE ) 500 MG tablet Take 1 tablet (500 mg total) by mouth 2 (two) times daily with a meal. 180 tablet 1   rosuvastatin  (CRESTOR ) 5 MG tablet Take 1 tablet (5 mg total) by mouth daily. 90 tablet 2   No facility-administered medications prior to visit.     ROS Review of Systems  Constitutional:  Negative for activity change and appetite change.  HENT:  Negative for sinus pressure and sore throat.   Respiratory:  Negative for chest tightness, shortness of breath and wheezing.   Cardiovascular:  Negative for chest pain and palpitations.  Gastrointestinal:  Positive for abdominal pain. Negative for abdominal distention and constipation.  Genitourinary: Negative.  Negative for flank pain.  Musculoskeletal:  Positive for back pain.  Psychiatric/Behavioral:  Negative for behavioral problems and dysphoric mood.     Objective:  BP (!) 147/91   Pulse 88    Temp 98.4 F (36.9 C) (Oral)   Ht 5' 2 (1.575 m)   Wt 153 lb 12.8 oz (69.8 kg)   SpO2 100%   BMI 28.13 kg/m      08/26/2024   11:35 AM 08/26/2024   11:08 AM 05/25/2024    9:19 AM  BP/Weight  Systolic BP 147 143 107  Diastolic BP 91 89 78  Wt. (Lbs)  153.8 153  BMI  28.13 kg/m2 27.98 kg/m2      Physical Exam Constitutional:      Appearance: She is well-developed.  Cardiovascular:     Rate and Rhythm: Normal rate.     Heart sounds: Normal heart  sounds. No murmur heard. Pulmonary:     Effort: Pulmonary effort is normal.     Breath sounds: Normal breath sounds. No wheezing or rales.  Chest:     Chest wall: No tenderness.  Abdominal:     General: Bowel sounds are normal. There is no distension.     Palpations: Abdomen is soft. There is no mass.     Tenderness: There is no abdominal tenderness. There is no right CVA tenderness or left CVA tenderness.  Musculoskeletal:        General: Normal range of motion.     Right lower leg: No edema.     Left lower leg: No edema.  Neurological:     Mental Status: She is alert and oriented to person, place, and time.  Psychiatric:        Mood and Affect: Mood normal.    Diabetic Foot Exam - Simple   Simple Foot Form Diabetic Foot exam was performed with the following findings: Yes 08/26/2024 11:32 AM  Visual Inspection No deformities, no ulcerations, no other skin breakdown bilaterally: Yes Sensation Testing Intact to touch and monofilament testing bilaterally: Yes Pulse Check Posterior Tibialis and Dorsalis pulse intact bilaterally: Yes Comments        Latest Ref Rng & Units 05/25/2024   10:03 AM 02/24/2024   11:19 AM 11/27/2023   11:56 AM  CMP  Glucose 70 - 99 mg/dL 876  892  97   BUN 8 - 27 mg/dL 19  12  9    Creatinine 0.57 - 1.00 mg/dL 9.30  9.44  9.44   Sodium 134 - 144 mmol/L 142  142  142   Potassium 3.5 - 5.2 mmol/L 4.4  4.6  4.3   Chloride 96 - 106 mmol/L 102  104  102   CO2 20 - 29 mmol/L 22  23  26     Calcium  8.7 - 10.3 mg/dL 89.8  9.4  9.3   Total Protein 6.0 - 8.5 g/dL 7.7  7.0  7.2   Total Bilirubin 0.0 - 1.2 mg/dL <9.7  <9.7  <9.7   Alkaline Phos 44 - 121 IU/L 90  92  99   AST 0 - 40 IU/L 12  12  15    ALT 0 - 32 IU/L 11  8  17      Lipid Panel     Component Value Date/Time   CHOL 133 11/27/2023 1156   TRIG 49 11/27/2023 1156   HDL 44 11/27/2023 1156   CHOLHDL 3.0 11/27/2023 1156   CHOLHDL 3.9 02/12/2016 1219   VLDL 11 02/12/2016 1219   LDLCALC 78 11/27/2023 1156    CBC    Component Value Date/Time   WBC 5.8 02/16/2023 1215   WBC 7.0 06/25/2022 2007   RBC 4.48 02/16/2023 1215   RBC 4.21 06/25/2022 2007   HGB 13.5 02/16/2023 1215   HCT 40.8 02/16/2023 1215   PLT 302 02/16/2023 1215   MCV 91 02/16/2023 1215   MCH 30.1 02/16/2023 1215   MCH 30.2 06/25/2022 2007   MCHC 33.1 02/16/2023 1215   MCHC 33.4 06/25/2022 2007   RDW 12.8 02/16/2023 1215   LYMPHSABS 2.7 02/16/2023 1215   MONOABS 0.5 06/25/2022 2007   EOSABS 0.3 02/16/2023 1215   BASOSABS 0.0 02/16/2023 1215    Lab Results  Component Value Date   HGBA1C 6.4 08/26/2024    Lab Results  Component Value Date   HGBA1C 6.4 08/26/2024   HGBA1C 6.9 05/25/2024   HGBA1C 7.7 (  H) 02/24/2024       Assessment & Plan Type 2 diabetes mellitus with other specified complications Diabetes well-controlled with A1c of 6.4. 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 No UTI detected. - Order kidney function tests. - Refill metformin  prescription. - Perform annual foot exam. -Counseled on Diabetic diet, the healthy plate, 849 minutes of moderate intensity exercise/week Blood sugar logs with fasting goals of 80-120 mg/dl, random of less than 819 and in the event of sugars less than 60 mg/dl or greater than 599 mg/dl encouraged to notify the clinic. Advised on the need for annual eye exams, annual foot exams, Pneumonia vaccine.   Hypertension Blood pressure slightly elevated today. Managed with lisinopril . -  Recheck blood pressure during the visit. - Refill lisinopril  prescription. -Counseled on blood pressure goal of less than 130/80, low-sodium, DASH diet, medication compliance, 150 minutes of moderate intensity exercise per week. Discussed medication compliance, adverse effects.   Hyperlipidemia Cholesterol levels normal in January. Managed with rosuvastatin . - Order cholesterol test. - Refill rosuvastatin  prescription.  Urinary frequency Urinary frequency without infection. Concentrated urine suggests need for increased hydration. - Encourage increased water intake. - Order vaginal swab to check for bacteria or yeast infection.  Migraine Migraines managed with Topamax . No issues with medication refills.  General Health Maintenance Due for pneumonia vaccine, recommended for diabetes. Declined today.       Meds ordered this encounter  Medications   metFORMIN  (GLUCOPHAGE ) 500 MG tablet    Sig: Take 1 tablet (500 mg total) by mouth 2 (two) times daily with a meal.    Dispense:  180 tablet    Refill:  1   rosuvastatin  (CRESTOR ) 5 MG tablet    Sig: Take 1 tablet (5 mg total) by mouth daily.    Dispense:  90 tablet    Refill:  2    Follow-up: Return in about 3 months (around 11/26/2024) for Medical conditions with PCP.       Mallory Sabin, MD, FAAFP. [facility] [place], [place] [phone]   08/26/2024, 12:39 PM

## 2024-08-27 LAB — CMP14+EGFR
ALT: 9 IU/L (ref 0–32)
AST: 11 IU/L (ref 0–40)
Albumin: 4.4 g/dL (ref 3.9–4.9)
Alkaline Phosphatase: 88 IU/L (ref 49–135)
BUN/Creatinine Ratio: 13 (ref 12–28)
BUN: 7 mg/dL — ABNORMAL LOW (ref 8–27)
Bilirubin Total: <0.2 mg/dL (ref 0.0–1.2)
CO2: 24 mmol/L (ref 20–29)
Calcium: 9.7 mg/dL (ref 8.7–10.3)
Chloride: 102 mmol/L (ref 96–106)
Creatinine, Ser: 0.53 mg/dL — ABNORMAL LOW (ref 0.57–1.00)
Globulin, Total: 3.3 g/dL (ref 1.5–4.5)
Glucose: 98 mg/dL (ref 70–99)
Potassium: 4 mmol/L (ref 3.5–5.2)
Sodium: 141 mmol/L (ref 134–144)
Total Protein: 7.7 g/dL (ref 6.0–8.5)
eGFR: 104 mL/min/1.73 (ref >59)

## 2024-08-27 LAB — LP+NON-HDL CHOLESTEROL
Cholesterol, Total: 115 mg/dL (ref 100–199)
HDL: 39 mg/dL — ABNORMAL LOW (ref >39)
LDL Chol Calc (NIH): 64 mg/dL (ref 0–99)
Total Non-HDL-Chol (LDL+VLDL): 76 mg/dL (ref 0–129)
Triglycerides: 49 mg/dL (ref 0–149)
VLDL Cholesterol Cal: 12 mg/dL (ref 5–40)

## 2024-08-29 ENCOUNTER — Ambulatory Visit: Payer: Self-pay | Admitting: Family Medicine

## 2024-08-29 LAB — CERVICOVAGINAL ANCILLARY ONLY
Bacterial Vaginitis (gardnerella): NEGATIVE
Candida Glabrata: NEGATIVE
Candida Vaginitis: NEGATIVE
Chlamydia: NEGATIVE
Comment: NEGATIVE
Comment: NEGATIVE
Comment: NEGATIVE
Comment: NEGATIVE
Comment: NEGATIVE
Comment: NORMAL
Neisseria Gonorrhea: NEGATIVE
Trichomonas: NEGATIVE

## 2024-08-31 ENCOUNTER — Ambulatory Visit (HOSPITAL_COMMUNITY): Admitting: Licensed Clinical Social Worker

## 2024-08-31 DIAGNOSIS — F341 Dysthymic disorder: Secondary | ICD-10-CM
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
## 2024-09-05 ENCOUNTER — Other Ambulatory Visit: Payer: Self-pay

## 2024-09-29 ENCOUNTER — Ambulatory Visit (HOSPITAL_COMMUNITY): Admitting: Licensed Clinical Social Worker

## 2024-09-29 DIAGNOSIS — F341 Dysthymic disorder: Secondary | ICD-10-CM | POA: Diagnosis not present

## 2024-09-29 DIAGNOSIS — Z634 Disappearance and death of family member: Secondary | ICD-10-CM | POA: Diagnosis not present
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
## 2024-10-07 ENCOUNTER — Other Ambulatory Visit: Payer: Self-pay

## 2024-11-01 ENCOUNTER — Other Ambulatory Visit: Payer: Self-pay

## 2024-11-02 ENCOUNTER — Ambulatory Visit (INDEPENDENT_AMBULATORY_CARE_PROVIDER_SITE_OTHER): Payer: PRIVATE HEALTH INSURANCE | Admitting: Licensed Clinical Social Worker

## 2024-11-02 ENCOUNTER — Other Ambulatory Visit: Payer: Self-pay

## 2024-11-02 DIAGNOSIS — F341 Dysthymic disorder: Secondary | ICD-10-CM

## 2024-11-02 NOTE — Progress Notes (Signed)
 THERAPIST PROGRESS NOTE   Session Date: 11/02/2024  Session Time: 1012 - 1110  Participation Level: Active  Behavioral Response: CasualAlertDepressed and Euthymic  Type of Therapy: Individual Therapy  Treatment Goals addressed:   Progressing (2) LTG: Reduce frequency, intensity, and duration of depression symptoms so that daily functioning is improved (OP Depression) LTG: Increase coping skills to manage depression and improve ability to perform daily activities (OP Depression)  Not Progressing (1) STG: Analina will identify cognitive patterns and beliefs that support depression (OP Depression)  Completed/Met (2) LTG: Kimm will score less than 5 on the Generalized Anxiety Disorder 7 Scale (GAD-7) (Anxiety) STG: Report a decrease in anxiety symptoms as evidenced by an overall reduction in anxiety score by a minimum of 25% on the Generalized Anxiety Disorder Scale (GAD-7) (Anxiety)  ProgressTowards Goals: Progressing  Interventions: CBT, Motivational Interviewing, Solution Focused, and Supportive  Summary: [place] is a 64 year old female with psych hx of PDD, presenting for follow up therapy session for support in the management of depressive and anxious sxs.   Patient actively engaged in session, initially presenting with depressed mood and congruent affect that gradually brightened as visit progressed. During check-in, pt reported things having been up and down, describing significant stressors over the past month, including grief surrounding the recent loss of sister and associated emotions related to now being the only surviving sibling. Pt also discussed ongoing conflict within romantic relationship and stress surrounding required move to a different unit within apartment complex due to renovations.  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 Pt  further processed ongoing relationship challenges, including partners persistent distrust, recurrent accusations of infidelity, and controlling behaviors such as discouraging pts communication with friends and issuing ultimatums. Pt expressed feeling increasingly exhausted by these dynamics. Session included extensive exploration of boundaries within relationships, with emphasis on how appropriate boundaries function to protect emotional well-being and reduce exposure to mistreatment.     08/26/2024   11:19 AM 08/11/2024   10:51 AM 06/16/2024   10:42 AM 04/06/2024   11:20 AM 02/24/2024   10:50 AM  Depression screen PHQ 2/9  Decreased Interest 1 0 1 0 1  Down, Depressed, Hopeless 1 1 3 1 1   PHQ - 2 Score 2 1 4 1 2   Altered sleeping 1 3 0 0 0  Tired, decreased energy 1 0 1 1 1   Change in appetite 1 1 0 0 0  Feeling bad or failure about yourself  0 1 1 0 0  Trouble concentrating 0 1 1 0 0  Moving slowly or fidgety/restless 0 0 0 0 0  Suicidal thoughts 0 1 1 0 0  PHQ-9 Score 5  8  8  2  3    Difficult doing work/chores Not difficult at all Not difficult at all Not difficult at all Somewhat difficult Not difficult at all     Data saved with a previous flowsheet row definition      08/26/2024   11:19 AM 08/11/2024   10:56 AM 06/16/2024   10:46 AM 04/06/2024   11:18 AM  GAD 7 : Generalized Anxiety Score  Nervous, Anxious, on Edge 1 1 1 1   Control/stop worrying 1 3 1  0  Worry too much - different things 1 3 1  0  Trouble relaxing 1 1 0 0  Restless 0 0 0 0  Easily annoyed or irritable 0 1 1 0  Afraid - awful  might happen 0 1 1 0  Total GAD 7 Score 4 10 5 1   Anxiety Difficulty  Not difficult at all Not difficult at all Somewhat difficult   Suicidal/Homicidal: Recent passive; No intent. No current SI, HI, AVH.  Therapist Response:  Clinician openly greeted patient upon presenting for visit, assessing presenting moods and affect, engaging patient in check-in. Utilized open ended questions  in eliciting recounts of events of the past month and ongoing stressors. Utilized active listening techniques in supporting pt's recounts of events, providing support and validation of thoughts and feelings, further aiding in processing. Utilized CBT to examine distortion patterns contributing to guilt, helplessness, and relationship-related stress; provided MI to explore pts ambivalence around relationship decisions and readiness for change; offered supportive reflection and validation of grief, role transition, and financial stress; and introduced boundary-setting concepts to strengthen insight into healthy relational functioning. Clinician reassessed severity of sxs, and presence of safety concerns.  [x]  Cognitive Challenging []  Cognitive Refocusing [x]  Cognitive Reframing  [x]  Communication Skills []  Compliance Issues []  DBT []  Exploration of Coping Patterns [x]  Exploration of Emotions [x]  Exploration of Relationship Patterns []  Guided Imagery []  Interactive Feedback [x]  Interpersonal Resolutions []  Mindfulness Training []  Preventative Services [x]  Psycho-Education  []  Relaxation/Deep Breathing []  Review of Treatment Plan/Progress []  Role-Play/Behavioral Rehearsal  [x]  Structured Problem Solving [x]  Supportive Reflection []  Symptom Management  []  Other   Patient responded well to interventions. Patient continues to meet criteria for PDD. Patient will continue to benefit from engagement in outpatient therapy due to being the least restrictive service to meet presenting needs. Pt proves to have made minimal progression towards tx goals in recent weeks.  Plan: Return again in 3 weeks.  Diagnosis: Persistent depressive disorder, mild  Collaboration of Care: Other None deemed necessary at this time.  Patient/Guardian was advised Release of Information must be obtained prior to any record release in order to collaborate their care with an outside provider. Patient/Guardian was advised if they  have not already done so to contact the registration department to sign all necessary forms in order for us  to release information regarding their care.   Consent: Patient/Guardian gives verbal consent for treatment and assignment of benefits for services provided during this visit. Patient/Guardian expressed understanding and agreed to proceed.   Lynwood JONETTA Maris, MSW, LCSW 11/02/2024,  10:14 AM

## 2024-11-23 ENCOUNTER — Ambulatory Visit (HOSPITAL_COMMUNITY): Admitting: Licensed Clinical Social Worker

## 2024-11-23 DIAGNOSIS — Z634 Disappearance and death of family member: Secondary | ICD-10-CM | POA: Diagnosis not present

## 2024-11-23 DIAGNOSIS — F341 Dysthymic disorder: Secondary | ICD-10-CM | POA: Diagnosis not present

## 2024-11-23 DIAGNOSIS — F4321 Adjustment disorder with depressed mood: Secondary | ICD-10-CM

## 2024-11-23 NOTE — Progress Notes (Signed)
 THERAPIST PROGRESS NOTE   Session Date: 11/23/2024  Session Time: 1011 - 1059 Virtual Visit via Video Note  I connected with Mallory Wall on 11/23/24 at 10:00 AM EST by a video enabled telemedicine application and verified that I am speaking with the correct person using two identifiers.  Location: Patient: Home Provider: Home office   I discussed the limitations of evaluation and management by telemedicine and the availability of in person appointments. The patient expressed understanding and agreed to proceed.  I discussed the assessment and treatment plan with the patient. The patient was provided an opportunity to ask questions and all were answered. The patient agreed with the plan and demonstrated an understanding of the instructions.   The patient was advised to call back or seek an in-person evaluation if the symptoms worsen or if the condition fails to improve as anticipated.  I provided 48 minutes of non-face-to-face time during this encounter.  Participation Level: Active  Behavioral Response: CasualAlertDepressed and Euthymic  Type of Therapy: Individual Therapy  Treatment Goals addressed:   Progressing (2) LTG: Reduce frequency, intensity, and duration of depression symptoms so that daily functioning is improved (OP Depression) LTG: Increase coping skills to manage depression and improve ability to perform daily activities (OP Depression)  Not Progressing (1) STG: Mallory Wall will identify cognitive patterns and beliefs that support depression (OP Depression)  Completed/Met (2) LTG: Mallory Wall will score less than 5 on the Generalized Anxiety Disorder 7 Scale (GAD-7) (Anxiety) STG: Report a decrease in anxiety symptoms as evidenced by an overall reduction in anxiety score by a minimum of 25% on the Generalized Anxiety Disorder Scale (GAD-7) (Anxiety)  ProgressTowards Goals: Progressing  Interventions: CBT, Motivational Interviewing, Solution Focused, and  Supportive  Summary: Mallory Wall is a 64 year old female with psych hx of PDD, presenting for follow up therapy session for support in the management of depressive and anxious sxs.   Patient actively engaged in todays session, presenting with depressed mood, congruent affect, and tearfulness throughout. During check-in, she provided detailed recounts of events over the past three weeks, focusing on escalating conflict with the romantic partner she has been involved with for seven years. She described a significant argument on Friday--the most intense argument they have had--stemming from ongoing disrespect, accusations, and the partners repeated insistence that she has been unfaithful. The patient clarified she has not been unfaithful and noted that the partners assumptions appear rooted in her communication with a female coworker, which she views as harmless and appropriate. She expressed increasing frustration with the unjustified mistreatment and shared that she has told her partner she needs distance to prioritize her own wellbeing.  The patient additionally processed family-related stress, explaining that she confided in one niece about her relationship struggles. That niece later confronted the patients partner directly, escalating tension further. This niece then involved the younger niece, who told the patient she must end the relationship or risk losing transportation and support for work and appointments--despite these being decisions the niece should not be making. The patient expressed distress that her nieces are exerting inappropriate control and making unilateral decisions that directly affect her functioning.  We explored the role of perception in influencing beliefs, emotions, and relational dynamics--specifically how her partners perception of infidelity, though unfounded, shapes his mistrust and behavior. The patient acknowledged that his perspective will not change without his own  willingness to reflect and take responsibility for healthier communication patterns.     08/26/2024   11:19 AM 08/11/2024   10:51 AM 06/16/2024  10:42 AM 04/06/2024   11:20 AM 02/24/2024   10:50 AM  Depression screen PHQ 2/9  Decreased Interest 1 0 1 0 1  Down, Depressed, Hopeless 1 1 3 1 1   PHQ - 2 Score 2 1 4 1 2   Altered sleeping 1 3 0 0 0  Tired, decreased energy 1 0 1 1 1   Change in appetite 1 1 0 0 0  Feeling bad or failure about yourself  0 1 1 0 0  Trouble concentrating 0 1 1 0 0  Moving slowly or fidgety/restless 0 0 0 0 0  Suicidal thoughts 0 1 1 0 0  PHQ-9 Score 5  8  8  2  3    Difficult doing work/chores Not difficult at all Not difficult at all Not difficult at all Somewhat difficult Not difficult at all     Data saved with a previous flowsheet row definition      08/26/2024   11:19 AM 08/11/2024   10:56 AM 06/16/2024   10:46 AM 04/06/2024   11:18 AM  GAD 7 : Generalized Anxiety Score  Nervous, Anxious, on Edge 1  1  1  1    Control/stop worrying 1  3  1   0   Worry too much - different things 1  3  1   0   Trouble relaxing 1  1  0  0   Restless 0  0  0  0   Easily annoyed or irritable 0  1  1  0   Afraid - awful might happen 0  1  1  0   Total GAD 7 Score 4 10 5 1   Anxiety Difficulty  Not difficult at all Not difficult at all Somewhat difficult     Data saved with a previous flowsheet row definition   Suicidal/Homicidal: Recent passive; No intent. No current SI, HI, AVH.  Therapist Response:  Clinician openly greeted patient upon presenting for visit, assessing presenting moods and affect, engaging patient in check-in. Utilized open ended questions in eliciting recounts of events of the past weeks and ongoing stressors. Utilized active listening techniques in supporting pt's recounts of events, providing support and validation of thoughts and feelings, further aiding in processing. Clinician used CBT to examine distorted perceptions within the relationship,  validate the patients reality, and help distinguish fact from assumption. MI techniques supported the patient in clarifying her personal boundaries, values, and readiness for change within the relationship. Supportive reflection normalized her emotional exhaustion, reinforced autonomy in decision-making, and highlighted the importance of protecting her wellbeing from both partner and family over-involvement. Clinician reassessed severity of sxs, and presence of safety concerns.  [x]  Cognitive Challenging []  Cognitive Refocusing [x]  Cognitive Reframing  [x]  Communication Skills []  Compliance Issues []  DBT []  Exploration of Coping Patterns [x]  Exploration of Emotions [x]  Exploration of Relationship Patterns []  Guided Imagery []  Interactive Feedback [x]  Interpersonal Resolutions []  Mindfulness Training []  Preventative Services [x]  Psycho-Education  []  Relaxation/Deep Breathing []  Review of Treatment Plan/Progress []  Role-Play/Behavioral Rehearsal  [x]  Structured Problem Solving [x]  Supportive Reflection [x]  Symptom Management  []  Other   Patient responded well to interventions. Patient continues to meet criteria for PDD. Patient will continue to benefit from engagement in outpatient therapy due to being the least restrictive service to meet presenting needs. Pt proves to have made minimal progression towards tx goals in recent weeks.  Plan: Return again in 3 weeks.  Diagnosis: Persistent depressive disorder, mild  Bereavement  Unresolved grief  Collaboration of Care:  Other None deemed necessary at this time.  Patient/Guardian was advised Release of Information must be obtained prior to any record release in order to collaborate their care with an outside provider. Patient/Guardian was advised if they have not already done so to contact the registration department to sign all necessary forms in order for us  to release information regarding their care.   Consent: Patient/Guardian gives verbal  consent for treatment and assignment of benefits for services provided during this visit. Patient/Guardian expressed understanding and agreed to proceed.   Lynwood JONETTA Maris, MSW, LCSW 11/23/2024,  10:12 AM

## 2024-11-28 ENCOUNTER — Telehealth

## 2024-11-28 ENCOUNTER — Ambulatory Visit: Admitting: Nurse Practitioner

## 2024-11-28 ENCOUNTER — Telehealth: Payer: Self-pay | Admitting: Nurse Practitioner

## 2024-11-29 ENCOUNTER — Other Ambulatory Visit: Payer: Self-pay | Admitting: Nurse Practitioner

## 2024-11-29 ENCOUNTER — Other Ambulatory Visit: Payer: Self-pay

## 2024-11-29 DIAGNOSIS — I10 Essential (primary) hypertension: Secondary | ICD-10-CM

## 2024-11-29 MED ORDER — LISINOPRIL 20 MG PO TABS
20.0000 mg | ORAL_TABLET | Freq: Every day | ORAL | 0 refills | Status: AC
  Filled 2024-11-29: qty 90, 90d supply, fill #0

## 2024-11-30 ENCOUNTER — Other Ambulatory Visit: Payer: Self-pay

## 2024-12-08 ENCOUNTER — Ambulatory Visit (HOSPITAL_COMMUNITY): Admitting: Licensed Clinical Social Worker

## 2024-12-14 ENCOUNTER — Ambulatory Visit (HOSPITAL_COMMUNITY): Admitting: Licensed Clinical Social Worker

## 2024-12-21 ENCOUNTER — Ambulatory Visit (HOSPITAL_COMMUNITY): Admitting: Licensed Clinical Social Worker

## 2024-12-23 ENCOUNTER — Ambulatory Visit: Admitting: Nurse Practitioner

## 2025-01-04 ENCOUNTER — Ambulatory Visit (HOSPITAL_COMMUNITY): Admitting: Licensed Clinical Social Worker

## 2025-01-18 ENCOUNTER — Ambulatory Visit (HOSPITAL_COMMUNITY): Admitting: Licensed Clinical Social Worker
